# Patient Record
Sex: Female | Born: 1955 | Race: White | Hispanic: No | Marital: Married | Smoking: Former smoker
Health system: Southern US, Community
[De-identification: ages and names within clinical notes are randomized; demographics above are authoritative.]

## PROBLEM LIST (undated history)

## (undated) DIAGNOSIS — E119 Type 2 diabetes mellitus without complications: Secondary | ICD-10-CM

## (undated) DIAGNOSIS — J189 Pneumonia, unspecified organism: Secondary | ICD-10-CM

## (undated) DIAGNOSIS — K219 Gastro-esophageal reflux disease without esophagitis: Secondary | ICD-10-CM

## (undated) DIAGNOSIS — I10 Essential (primary) hypertension: Secondary | ICD-10-CM

## (undated) DIAGNOSIS — J449 Chronic obstructive pulmonary disease, unspecified: Secondary | ICD-10-CM

## (undated) DIAGNOSIS — F419 Anxiety disorder, unspecified: Secondary | ICD-10-CM

## (undated) DIAGNOSIS — M329 Systemic lupus erythematosus, unspecified: Secondary | ICD-10-CM

## (undated) DIAGNOSIS — M1990 Unspecified osteoarthritis, unspecified site: Secondary | ICD-10-CM

## (undated) DIAGNOSIS — K659 Peritonitis, unspecified: Secondary | ICD-10-CM

## (undated) DIAGNOSIS — J45909 Unspecified asthma, uncomplicated: Secondary | ICD-10-CM

## (undated) DIAGNOSIS — M797 Fibromyalgia: Secondary | ICD-10-CM

## (undated) HISTORY — PX: TONSILLECTOMY: SUR1361

## (undated) HISTORY — PX: SMALL INTESTINE SURGERY: SHX150

## (undated) HISTORY — PX: SPLENECTOMY, TOTAL: SHX788

## (undated) HISTORY — PX: JOINT REPLACEMENT: SHX530

## (undated) HISTORY — PX: CHOLECYSTECTOMY: SHX55

## (undated) HISTORY — PX: TOTAL ABDOMINAL HYSTERECTOMY W/ BILATERAL SALPINGOOPHORECTOMY: SHX83

## (undated) HISTORY — PX: ABDOMINAL SURGERY: SHX537

## (undated) HISTORY — PX: ABDOMINAL HYSTERECTOMY: SHX81

## (undated) HISTORY — PX: COLON SURGERY: SHX602

## (undated) HISTORY — DX: Peritonitis, unspecified: K65.9

## (undated) HISTORY — PX: APPENDECTOMY: SHX54

---

## 2017-04-04 ENCOUNTER — Encounter: Payer: Self-pay | Admitting: Emergency Medicine

## 2017-04-04 ENCOUNTER — Emergency Department: Payer: BLUE CROSS/BLUE SHIELD

## 2017-04-04 ENCOUNTER — Emergency Department
Admission: EM | Admit: 2017-04-04 | Discharge: 2017-04-04 | Disposition: A | Discharge status: Home / Self Care | Payer: BLUE CROSS/BLUE SHIELD | Attending: Emergency Medicine | Admitting: Emergency Medicine

## 2017-04-04 DIAGNOSIS — I10 Essential (primary) hypertension: Secondary | ICD-10-CM | POA: Diagnosis not present

## 2017-04-04 DIAGNOSIS — R51 Headache: Secondary | ICD-10-CM | POA: Insufficient documentation

## 2017-04-04 DIAGNOSIS — R110 Nausea: Secondary | ICD-10-CM | POA: Insufficient documentation

## 2017-04-04 DIAGNOSIS — R0789 Other chest pain: Secondary | ICD-10-CM | POA: Insufficient documentation

## 2017-04-04 DIAGNOSIS — E119 Type 2 diabetes mellitus without complications: Secondary | ICD-10-CM | POA: Diagnosis not present

## 2017-04-04 DIAGNOSIS — J45909 Unspecified asthma, uncomplicated: Secondary | ICD-10-CM | POA: Insufficient documentation

## 2017-04-04 HISTORY — DX: Type 2 diabetes mellitus without complications: E11.9

## 2017-04-04 HISTORY — DX: Unspecified asthma, uncomplicated: J45.909

## 2017-04-04 HISTORY — DX: Fibromyalgia: M79.7

## 2017-04-04 HISTORY — DX: Essential (primary) hypertension: I10

## 2017-04-04 HISTORY — DX: Systemic lupus erythematosus, unspecified: M32.9

## 2017-04-04 LAB — CBC
HCT: 42 % (ref 35.0–47.0)
Hemoglobin: 14.6 g/dL (ref 12.0–16.0)
MCH: 30.9 pg (ref 26.0–34.0)
MCHC: 34.8 g/dL (ref 32.0–36.0)
MCV: 88.8 fL (ref 80.0–100.0)
Platelets: 271 10*3/uL (ref 150–440)
RBC: 4.74 MIL/uL (ref 3.80–5.20)
RDW: 13.4 % (ref 11.5–14.5)
WBC: 6 10*3/uL (ref 3.6–11.0)

## 2017-04-04 LAB — BASIC METABOLIC PANEL
Anion gap: 8 (ref 5–15)
BUN: 10 mg/dL (ref 6–20)
CALCIUM: 9 mg/dL (ref 8.9–10.3)
CO2: 25 mmol/L (ref 22–32)
Chloride: 103 mmol/L (ref 101–111)
Creatinine, Ser: 1.08 mg/dL — ABNORMAL HIGH (ref 0.44–1.00)
GFR calc Af Amer: >60 mL/min (ref >60)
GFR, EST NON AFRICAN AMERICAN: 55 mL/min — AB (ref >60)
GLUCOSE: 149 mg/dL — AB (ref 65–99)
Potassium: 3.9 mmol/L (ref 3.5–5.1)
Sodium: 136 mmol/L (ref 135–145)

## 2017-04-04 LAB — TROPONIN I

## 2017-04-04 MED ORDER — ACETAMINOPHEN 325 MG PO TABS
ORAL_TABLET | ORAL | Status: AC
  Filled 2017-04-04: qty 2

## 2017-04-04 MED ORDER — ACETAMINOPHEN 325 MG PO TABS
650.0000 mg | ORAL_TABLET | Freq: Once | ORAL | Status: AC
  Administered 2017-04-04: 650 mg via ORAL

## 2017-04-04 NOTE — ED Notes (Signed)
Patient was triaged by this RN, put in computer by error under [facility] ED tech.

## 2017-04-04 NOTE — ED Provider Notes (Signed)
[facility] Emergency Department Provider Note  ____________________________________________   I have reviewed the triage vital signs and the nursing notes.   HISTORY  Chief Complaint Chest Pain    HPI Meredith Butler is a 61 y.o. female who has a history of knee replacements, fibromyalgia, remote history of abdominal surgery, including appendix surgery, cholecystectomy, and hysterectomy. Patient states her last 2 days she's had a pain in the left chest wall. It's worse when she moves or touches it. She states she had a bad cough 2 days ago. Cough is better but she still has residual pain. It is sharp and nonradiating. She has had mild nausea sometimes. She did have nitroglycerin. It did not seem to make much of a difference. Patient states her pain is currently 2 out of 10. She denies any exertional symptoms, she denies history of herself or her family PE or DVT she denies recent surgery, she denies leg swelling, she denies recent travel, she denies exhaustion estrogens or pregnancy, patient states that she can identify the exact spot in the costochondral margin where she has the pain. She states the pain has been uninterrupted the there for the last 3 days since she coughed. She states that she has had no exertional symptoms. She states that she has no pleuritic chest pain or shortness of breath. The pain did not radiate to the back it was not stabbing or tearing in onset. She does have a history of hypertension she is not quite with her medications usual blood pressures over 2 and 140s 150s.   Past Medical History:  Diagnosis Date  . Asthma   . Diabetes mellitus without complication ([place])   . Fibromyalgia   . Hypertension   . Lupus     There are no active problems to display for this patient.   Past Surgical History:  Procedure Laterality Date  . ABDOMINAL HYSTERECTOMY    . ABDOMINAL SURGERY    . APPENDECTOMY    . CHOLECYSTECTOMY    . COLON SURGERY    .  TONSILLECTOMY      Prior to Admission medications   Not on File    Allergies Claforan [cefotaxime]; Ibuprofen; Omnicef [cefdinir]; Penicillins; Sulfa antibiotics; Tetanus toxoids; Voltaren [diclofenac sodium]; Codeine; Diovan [valsartan]; and Rocephin [ceftriaxone]  No family history on file.  Social History Social History  Substance Use Topics  . Smoking status: Not on file  . Smokeless tobacco: Not on file  . Alcohol use Not on file    Review of Systems Constitutional: No fever/chills Eyes: No visual changes. ENT: No sore throat. No stiff neck no neck pain Cardiovascular: Positive chest pain. Respiratory: Denies shortness of breath. Gastrointestinal:   no vomiting.  No diarrhea.  No constipation. Genitourinary: Negative for dysuria. Musculoskeletal: Negative lower extremity swelling Skin: Negative for rash. Neurological: Negative for severe headaches, focal weakness or numbness. 10-point ROS otherwise negative.  ____________________________________________   PHYSICAL EXAM:  VITAL SIGNS: ED Triage Vitals  Enc Vitals Group     BP 04/04/17 1513 (!) 160/109     Pulse Rate 04/04/17 1513 86     Resp 04/04/17 1513 17     Temp --      Temp src --      SpO2 04/04/17 1513 95 %     Weight 04/04/17 1611 236 lb 15.9 oz (107.5 kg)     Height 04/04/17 1558 5\' 7"  (1.702 m)     Head Circumference --      Peak Flow --  Pain Score 04/04/17 1522 3     Pain Loc --      Pain Edu? --      Excl. in [place]? --     Constitutional: Alert and oriented. Well appearing and in no acute distress. Eyes: Conjunctivae are normal. PERRL. EOMI. Head: Atraumatic. Nose: No congestion/rhinnorhea. Mouth/Throat: Mucous membranes are moist.  Oropharynx non-erythematous. Neck: No stridor.   Nontender with no meningismus Cardiovascular: Normal rate, regular rhythm. Grossly normal heart sounds.  Good peripheral circulation. Chest: Female chaperone present, patient spells but also present at her  request. Patient has refusal chest wall pain in the costochondral margin on the left. When I touch this area patient says "ouch that's the pain right there". No fracture palpated, no crepitus no flail chest. Respiratory: Normal respiratory effort.  No retractions. Lungs CTAB. Abdominal: Soft and nontender. No distention. No guarding no rebound Back:  There is no focal tenderness or step off.  there is no midline tenderness there are no lesions noted. there is no CVA tenderness Musculoskeletal: No lower extremity tenderness, no upper extremity tenderness. No joint effusions, no DVT signs strong distal pulses no edema Neurologic:  Normal speech and language. No gross focal neurologic deficits are appreciated.  Skin:  Skin is warm, dry and intact. No rash noted. Psychiatric: Mood and affect are normal. Speech and behavior are normal.  ____________________________________________   LABS (all labs ordered are listed, but only abnormal results are displayed)  Labs Reviewed  BASIC METABOLIC PANEL - Abnormal; Notable for the following:       Result Value   Glucose, Bld 149 (*)    Creatinine, Ser 1.08 (*)    GFR calc non Af Amer 55 (*)    All other components within normal limits  CBC  TROPONIN I  TROPONIN I   ____________________________________________  EKG  I personally interpreted any EKGs ordered by me or triage EKG shows normal sinus rhythm 80 bpm no acute ST admission acute ST depression normal axis unremarkable EKG ____________________________________________  RADIOLOGY  I reviewed any imaging ordered by me or triage that were performed during my shift and, if possible, patient and/or family made aware of any abnormal findings. ____________________________________________   PROCEDURES  Procedure(s) performed: None  Procedures  Critical Care performed: None  ____________________________________________   INITIAL IMPRESSION / ASSESSMENT AND PLAN / ED COURSE  Pertinent  labs & imaging results that were available during my care of the patient were reviewed by me and considered in my medical decision making (see chart for details).  Patient here with very reproducible chest wall pain after coughing for 3 days. There is low suspicion for ACS, initial and second troponin are negative. Low heart score, patient does have reproducible chest wall pain.At this time, there does not appear to be clinical evidence to support the diagnosis of pulmonary embolus, dissection, myocarditis, endocarditis, pericarditis, pericardial tamponade, acute coronary syndrome, pneumothorax, pneumonia, or any other acute intrathoracic pathology that will require admission or acute intervention. Nor is there evidence of any significant intra-abdominal pathology causing this discomfort. Blood pressure was initially elevated but his come as she has relaxed. She is noncompliant with blood pressure medication. We will send her home on her home blood pressure medication. There is no evidence at this time of acute coronary syndrome, I don't think she has a dissection, I think she will do better at home. Patient very comfortable with this plan. Her pain is minimal and only there when I touch it or when she moves.  Since of return precautions and follow-up given and understood.    ____________________________________________   FINAL CLINICAL IMPRESSION(S) / ED DIAGNOSES  Final diagnoses:  None      This chart was dictated using voice recognition software.  Despite best efforts to proofread,  errors can occur which can change meaning.      Schuyler Amor, MD 04/04/17 [phone]

## 2017-04-04 NOTE — ED Triage Notes (Signed)
Patient comes in from home via [facility] with chest pain that has been off and on for 2 days but more consistent today. Patient reports she was grocery shopping and the pain started. Reports it is central/left chest. Ems gave patient 324mg  aspirin, 2 nitro sprays. Patient currently is complaining of headache and nausea.

## 2017-12-24 ENCOUNTER — Ambulatory Visit (INDEPENDENT_AMBULATORY_CARE_PROVIDER_SITE_OTHER): Payer: BLUE CROSS/BLUE SHIELD | Admitting: Unknown Physician Specialty

## 2017-12-24 ENCOUNTER — Encounter: Payer: Self-pay | Admitting: Unknown Physician Specialty

## 2017-12-24 ENCOUNTER — Other Ambulatory Visit: Payer: Self-pay | Admitting: Unknown Physician Specialty

## 2017-12-24 DIAGNOSIS — J4540 Moderate persistent asthma, uncomplicated: Secondary | ICD-10-CM | POA: Diagnosis not present

## 2017-12-24 DIAGNOSIS — E7800 Pure hypercholesterolemia, unspecified: Secondary | ICD-10-CM | POA: Diagnosis not present

## 2017-12-24 DIAGNOSIS — R7989 Other specified abnormal findings of blood chemistry: Secondary | ICD-10-CM | POA: Diagnosis not present

## 2017-12-24 DIAGNOSIS — I10 Essential (primary) hypertension: Secondary | ICD-10-CM | POA: Diagnosis not present

## 2017-12-24 DIAGNOSIS — E1169 Type 2 diabetes mellitus with other specified complication: Secondary | ICD-10-CM | POA: Insufficient documentation

## 2017-12-24 DIAGNOSIS — J45909 Unspecified asthma, uncomplicated: Secondary | ICD-10-CM | POA: Insufficient documentation

## 2017-12-24 DIAGNOSIS — E119 Type 2 diabetes mellitus without complications: Secondary | ICD-10-CM | POA: Diagnosis not present

## 2017-12-24 DIAGNOSIS — M2550 Pain in unspecified joint: Secondary | ICD-10-CM

## 2017-12-24 DIAGNOSIS — M797 Fibromyalgia: Secondary | ICD-10-CM | POA: Diagnosis not present

## 2017-12-24 HISTORY — DX: Other specified abnormal findings of blood chemistry: R79.89

## 2017-12-24 MED ORDER — CYCLOBENZAPRINE HCL 10 MG PO TABS: 10.0000 mg | ORAL_TABLET | Freq: Three times a day (TID) | ORAL | 0 refills | Status: DC | PRN

## 2017-12-24 NOTE — Assessment & Plan Note (Signed)
Add Cyclobenzeprine QHS

## 2017-12-24 NOTE — Assessment & Plan Note (Signed)
Laino treatment.  Just on for 2 weeks.  Will check next visit

## 2017-12-24 NOTE — Assessment & Plan Note (Signed)
Will do arthritis labs.  Under care of rheumatologist in past.  Will refer to rheumatology

## 2017-12-24 NOTE — Assessment & Plan Note (Signed)
Appropriate treatment on metformin.  Will refer to lifestyle center

## 2017-12-24 NOTE — Assessment & Plan Note (Signed)
Not to goal.  Taking Albuterol daily.  Advair bothered her.  Does not want another inhaler

## 2017-12-24 NOTE — Progress Notes (Signed)
BP 138/83   Pulse 82   Temp 98.3 F (36.8 C) (Oral)   Ht 5' 8.5" (1.74 m)   Wt 223 lb 9.6 oz (101.4 kg)   SpO2 96%   BMI 33.50 kg/m    Subjective:    Patient ID: Meredith Butler, female    DOB: [birth year], 62 y.o.   MRN: [id number]  HPI: Meredith Butler is a 62 y.o. female  Chief Complaint  Patient presents with  . Establish Care    pt states she wants to discuss lab work and possible diabetes  . Muscle Pain    pt states she has been getting stiff and feels like muscles get knots on them   . Cyst    pt states she has a place on her back she would like looked at, states it has been there for years    Meredith Butler is [place] to the area and getting medical care in [place].  She has a history of fibromyalgia..    Muscle pain Pt states her muscles "knot up" and they are "ripping off her bone."  Her hands are swollen.  This is most notably a problem with her PIP and thumb MTP.  Pt is taking Duloxetine for fibromyalgia symptoms.  States pain is getting worse.  She does have bilateral knee replacements.  She has seen a rheumatologist and had "her about diagnosed with lupus."  Diabetes Hgb A1C is 6.7 according to patient.  She has been on Metformin for only 2 weeks.  No frequent thirst or urination.    High TSH TSH done on 07/10/17 was 4.83.  Complains of of fatigue.  Takes 2 days to get over working  Hypertension  Using medications without difficulty Average home BPs Does not check   Using medication without problems or lightheadedness No chest pain with exertion or shortness of breath No Edema  Elevated Cholesterol Using medications without problems No change in muscle pain on or off Atorvastatin Diet: Exercise: has an active job.  Starting to cut out sugar.   Asthma Daily inhaler use with working outside   Social History   Socioeconomic History  . Marital status: Married    Spouse name: Not on file  . Number of children: Not on file  . Years of education: Not on file  . Highest  education level: Not on file  Social Needs  . Financial resource strain: Not on file  . Food insecurity - worry: Not on file  . Food insecurity - inability: Not on file  . Transportation needs - medical: Not on file  . Transportation needs - non-medical: Not on file  Occupational History  . Not on file  Tobacco Use  . Smoking status: Former Smoker    Last attempt to quit: 11/20/2009    Years since quitting: 8.0  . Smokeless tobacco: Never Used  Substance and Sexual Activity  . Alcohol use: No    Frequency: Never  . Drug use: No  . Sexual activity: Yes  Other Topics Concern  . Not on file  Social History Narrative  . Not on file   Family History  Problem Relation Age of Onset  . Heart attack Mother   . Cancer Mother   . Heart attack Father   . Diabetes Brother   . Diabetes Daughter   . Cancer Maternal Grandmother        ovarian  . Cancer Paternal Grandfather        colon  . Cancer Sister  lung  . Diabetes Daughter     Past Surgical History:  Procedure Laterality Date  . ABDOMINAL HYSTERECTOMY     complete  . ABDOMINAL SURGERY    . APPENDECTOMY    . CHOLECYSTECTOMY    . COLON SURGERY    . JOINT REPLACEMENT Bilateral    knee  . SMALL INTESTINE SURGERY    . SPLENECTOMY, TOTAL    . TONSILLECTOMY      Past Medical History:  Diagnosis Date  . Asthma   . Diabetes mellitus without complication ([place])   . Fibromyalgia   . Hypertension   . Lupus   . Peritonitis ([place])     Relevant past medical, surgical, family and social history reviewed and updated as indicated. Interim medical history since our last visit reviewed. Allergies and medications reviewed and updated.  Review of Systems  Per HPI unless specifically indicated above     Objective:    BP 138/83   Pulse 82   Temp 98.3 F (36.8 C) (Oral)   Ht 5' 8.5" (1.74 m)   Wt 223 lb 9.6 oz (101.4 kg)   SpO2 96%   BMI 33.50 kg/m   Wt Readings from Last 3 Encounters:  12/24/17 223 lb 9.6 oz  (101.4 kg)  04/04/17 236 lb 15.9 oz (107.5 kg)    Physical Exam  Constitutional: She is oriented to person, place, and time. She appears well-developed and well-nourished. No distress.  HENT:  Head: Normocephalic and atraumatic.  Eyes: Conjunctivae and lids are normal. Right eye exhibits no discharge. Left eye exhibits no discharge. No scleral icterus.  Neck: Normal range of motion. Neck supple. No JVD present. Carotid bruit is not present.  Cardiovascular: Normal rate, regular rhythm and normal heart sounds.  Pulmonary/Chest: Effort normal and breath sounds normal.  Abdominal: Normal appearance. There is no splenomegaly or hepatomegaly.  Musculoskeletal: Normal range of motion.  Neurological: She is alert and oriented to person, place, and time.  Skin: Skin is warm, dry and intact. No rash noted. No pallor.  Psychiatric: She has a normal mood and affect. Her behavior is normal. Judgment and thought content normal.    Results for orders placed or performed during the hospital encounter of 63/81/77  Basic metabolic panel  Result Value Ref Range   Sodium 136 135 - 145 mmol/L   Potassium 3.9 3.5 - 5.1 mmol/L   Chloride 103 101 - 111 mmol/L   CO2 25 22 - 32 mmol/L   Glucose, Bld 149 (H) 65 - 99 mg/dL   BUN 10 6 - 20 mg/dL   Creatinine, Ser 1.08 (H) 0.44 - 1.00 mg/dL   Calcium 9.0 8.9 - 10.3 mg/dL   GFR calc non Af Amer 55 (L) >60 mL/min   GFR calc Af Amer >60 >60 mL/min   Anion gap 8 5 - 15  CBC  Result Value Ref Range   WBC 6.0 3.6 - 11.0 K/uL   RBC 4.74 3.80 - 5.20 MIL/uL   Hemoglobin 14.6 12.0 - 16.0 g/dL   HCT 42.0 35.0 - 47.0 %   MCV 88.8 80.0 - 100.0 fL   MCH 30.9 26.0 - 34.0 pg   MCHC 34.8 32.0 - 36.0 g/dL   RDW 13.4 11.5 - 14.5 %   Platelets 271 150 - 440 K/uL  Troponin I  Result Value Ref Range   Troponin I <0.03 <0.03 ng/mL  Troponin I  Result Value Ref Range   Troponin I <0.03 <0.03 ng/mL  Assessment & Plan:   Problem List Items Addressed This Visit       Unprioritized   Asthma    Not to goal.  Taking Albuterol daily.  Advair bothered her.  Does not want another inhaler      Relevant Medications   PROAIR HFA 108 (90 Base) MCG/ACT inhaler   Elevated TSH   Relevant Orders   Thyroid Panel With TSH   Essential hypertension, benign   Relevant Medications   atorvastatin (LIPITOR) 20 MG tablet   lisinopril-hydrochlorothiazide (PRINZIDE,ZESTORETIC) 20-12.5 MG tablet   Other Relevant Orders   Comprehensive metabolic panel   Fibromyalgia    Add Cyclobenzeprine QHS      Relevant Orders   Vitamin B12   VITAMIN D 25 Hydroxy (Vit-D Deficiency, Fractures)   Hypercholesteremia    M[place] treatment.  Just on for 2 weeks.  Will check next visit      Relevant Medications   atorvastatin (LIPITOR) 20 MG tablet   lisinopril-hydrochlorothiazide (PRINZIDE,ZESTORETIC) 20-12.5 MG tablet   Polyarthralgia    Will do arthritis labs.  Under care of rheumatologist in past.  Will refer to rheumatology      Relevant Orders   Ambulatory referral to Rheumatology   Sed Rate (ESR)   Rheumatoid Arthritis Profile   ANA w/Reflex   CBC with Differential/Platelet   Type 2 diabetes mellitus without complication, without long-term current use of insulin ([place])    Appropriate treatment on metformin.  Will refer to lifestyle center      Relevant Medications   atorvastatin (LIPITOR) 20 MG tablet   metFORMIN (GLUCOPHAGE) 500 MG tablet   lisinopril-hydrochlorothiazide (PRINZIDE,ZESTORETIC) 20-12.5 MG tablet   Other Relevant Orders   Amb Referral to Nutrition and Diabetic E       Follow up plan: Return in about 4 weeks (around 01/21/2018) for Fibromyalgia and Spirometry.

## 2017-12-25 ENCOUNTER — Encounter: Payer: Self-pay | Admitting: Unknown Physician Specialty

## 2017-12-26 LAB — CBC WITH DIFFERENTIAL/PLATELET
BASOS: 1 %
Basophils Absolute: 0.1 10*3/uL (ref 0.0–0.2)
EOS (ABSOLUTE): 0.3 10*3/uL (ref 0.0–0.4)
EOS: 5 %
HEMATOCRIT: 39.4 % (ref 34.0–46.6)
HEMOGLOBIN: 13.6 g/dL (ref 11.1–15.9)
IMMATURE GRANULOCYTES: 0 %
Immature Grans (Abs): 0 10*3/uL (ref 0.0–0.1)
Lymphocytes Absolute: 1.3 10*3/uL (ref 0.7–3.1)
Lymphs: 21 %
MCH: 30.7 pg (ref 26.6–33.0)
MCHC: 34.5 g/dL (ref 31.5–35.7)
MCV: 89 fL (ref 79–97)
MONOS ABS: 0.5 10*3/uL (ref 0.1–0.9)
Monocytes: 8 %
NEUTROS PCT: 65 %
Neutrophils Absolute: 4 10*3/uL (ref 1.4–7.0)
Platelets: 297 10*3/uL (ref 150–379)
RBC: 4.43 x10E6/uL (ref 3.77–5.28)
RDW: 13.4 % (ref 12.3–15.4)
WBC: 6.1 10*3/uL (ref 3.4–10.8)

## 2017-12-26 LAB — VITAMIN D 25 HYDROXY (VIT D DEFICIENCY, FRACTURES): VIT D 25 HYDROXY: 31 ng/mL (ref 30.0–100.0)

## 2017-12-26 LAB — ENA+DNA/DS+SJORGEN'S
ENA RNP Ab: 1.7 AI — ABNORMAL HIGH (ref 0.0–0.9)
ENA SM Ab Ser-aCnc: <0.2 AI (ref 0.0–0.9)
ENA SSB (LA) Ab: <0.2 AI (ref 0.0–0.9)

## 2017-12-26 LAB — COMPREHENSIVE METABOLIC PANEL
ALT: 18 IU/L (ref 0–32)
AST: 17 IU/L (ref 0–40)
Albumin/Globulin Ratio: 1.4 (ref 1.2–2.2)
Albumin: 4.3 g/dL (ref 3.6–4.8)
Alkaline Phosphatase: 77 IU/L (ref 39–117)
BUN/Creatinine Ratio: 16 (ref 12–28)
BUN: 14 mg/dL (ref 8–27)
Bilirubin Total: 0.6 mg/dL (ref 0.0–1.2)
CALCIUM: 9.3 mg/dL (ref 8.7–10.3)
CO2: 23 mmol/L (ref 20–29)
Chloride: 100 mmol/L (ref 96–106)
Creatinine, Ser: 0.88 mg/dL (ref 0.57–1.00)
GFR calc Af Amer: 82 mL/min/{1.73_m2} (ref >59)
GFR, EST NON AFRICAN AMERICAN: 71 mL/min/{1.73_m2} (ref >59)
GLOBULIN, TOTAL: 3 g/dL (ref 1.5–4.5)
GLUCOSE: 135 mg/dL — AB (ref 65–99)
Potassium: 4.1 mmol/L (ref 3.5–5.2)
Sodium: 140 mmol/L (ref 134–144)
Total Protein: 7.3 g/dL (ref 6.0–8.5)

## 2017-12-26 LAB — SEDIMENTATION RATE: SED RATE: 18 mm/h (ref 0–40)

## 2017-12-26 LAB — RHEUMATOID ARTHRITIS PROFILE: Cyclic Citrullin Peptide Ab: 6 units (ref 0–19)

## 2017-12-26 LAB — VITAMIN B12: VITAMIN B 12: 572 pg/mL (ref 232–1245)

## 2017-12-26 LAB — THYROID PANEL WITH TSH
FREE THYROXINE INDEX: 2 (ref 1.2–4.9)
T3 UPTAKE RATIO: 28 % (ref 24–39)
T4, Total: 7 ug/dL (ref 4.5–12.0)
TSH: 2.07 u[IU]/mL (ref 0.450–4.500)

## 2017-12-26 LAB — ANA W/REFLEX: Anti Nuclear Antibody(ANA): POSITIVE — AB

## 2018-01-14 ENCOUNTER — Encounter: Payer: Self-pay | Admitting: *Deleted

## 2018-01-14 ENCOUNTER — Encounter: Payer: BLUE CROSS/BLUE SHIELD | Attending: Unknown Physician Specialty | Admitting: *Deleted

## 2018-01-14 VITALS — BP 120/82 | Ht 69.0 in | Wt 220.7 lb

## 2018-01-14 DIAGNOSIS — Z6832 Body mass index (BMI) 32.0-32.9, adult: Secondary | ICD-10-CM | POA: Insufficient documentation

## 2018-01-14 DIAGNOSIS — Z713 Dietary counseling and surveillance: Secondary | ICD-10-CM | POA: Diagnosis present

## 2018-01-14 DIAGNOSIS — E119 Type 2 diabetes mellitus without complications: Secondary | ICD-10-CM | POA: Diagnosis not present

## 2018-01-14 NOTE — Patient Instructions (Addendum)
Check blood sugars 2 x day before breakfast and 2 hrs after one meal 3-4 x week Bring blood sugar records to the next appointment  Call your doctor for a prescription for:  1. Meter strips (type) One Touch Verio checking  3-4 times per week  2. Lancets (type) One Touch Delica checking  3-4   times per week  Exercise: Begin walking for  10-15  minutes   3 days a week and increase as tolerated  Eat 3 meals day, 1-2  snacks a day Space meals 4-6 hours apart Avoid sugar sweetened drinks (tea) Complete 3 Day Food Record and bring to next appt  Return for appointment on:  Tuesday January 28, 2018 at 1:30 pm with Meredith Butler (dietitian)

## 2018-01-15 NOTE — Progress Notes (Signed)
Diabetes Self-Management Education  Visit Type: First/Initial  Appt. Start Time: 1520 Appt. End Time: 3419  01/14/2018  Ms. Meredith Butler, identified by name and date of birth, is a 62 y.o. female with a diagnosis of Diabetes: Type 2.   ASSESSMENT  Blood pressure 120/82, height 5\' 9"  (1.753 m), weight 220 lb 11.2 oz (100.1 kg). Body mass index is 32.59 kg/m.  Diabetes Self-Management Education - 01/14/18 1654      Visit Information   Visit Type  First/Initial      Initial Visit   Diabetes Type  Type 2    Are you currently following a meal plan?  No    Are you taking your medications as prescribed?  Yes    Date Diagnosed  last month      Health Coping   How would you rate your overall health?  Fair      Psychosocial Assessment   Patient Belief/Attitude about Diabetes  Other (comment) "depressed"    Self-care barriers  None    Self-management support  Doctor's office;Family    Patient Concerns  Nutrition/Meal planning;Medication;Glycemic Control;Weight Control    Special Needs  None    Preferred Learning Style  Visual;Auditory    Learning Readiness  Ready    How often do you need to have someone help you when you read instructions, pamphlets, or other written materials from your doctor or pharmacy?  1 - Never    What is the last grade level you completed in school?  GED      Pre-Education Assessment   Patient understands the diabetes disease and treatment process.  Needs Instruction    Patient understands incorporating nutritional management into lifestyle.  Needs Instruction    Patient undertands incorporating physical activity into lifestyle.  Needs Instruction    Patient understands using medications safely.  Needs Instruction    Patient understands monitoring blood glucose, interpreting and using results  Needs Instruction    Patient understands prevention, detection, and treatment of acute complications.  Needs Instruction    Patient understands prevention, detection,  and treatment of chronic complications.  Needs Instruction    Patient understands how to develop strategies to address psychosocial issues.  Needs Instruction    Patient understands how to develop strategies to promote health/change behavior.  Needs Instruction      Complications   How often do you check your blood sugar?  0 times/day (not testing) Provided One Touch Verio Flex and instructed on use. BG upon return demonstration was 81 mg/dL at 4:30 pm - 6 hrs pp.     Have you had a dilated eye exam in the past 12 months?  Yes    Have you had a dental exam in the past 12 months?  No    Are you checking your feet?  Yes    How many days per week are you checking your feet?  2      Dietary Intake   Breakfast  cereal and milk; bacon, eggs, pancakes, frits    Snack (morning)  fruit    Lunch  salad or tuna or left overs from supper    Dinner  chicken, pork, beef, potatoes, bread, peas, beans, corn, rice, pasta, tomato, carrots, cuccumbers, broccoli, cauliflower    Beverage(s)  sugar sweetened tea, water, black coffee      Exercise   Exercise Type  ADL's      Patient Education   Previous Diabetes Education  Yes (please comment) went with her mother -  who also has diabetes    Disease state   Definition of diabetes, type 1 and 2, and the diagnosis of diabetes;Factors that contribute to the development of diabetes    Nutrition management   Role of diet in the treatment of diabetes and the relationship between the three main macronutrients and blood glucose level;Reviewed blood glucose goals for pre and post meals and how to evaluate the patients' food intake on their blood glucose level.    Physical activity and exercise   Role of exercise on diabetes management, blood pressure control and cardiac health.    Medications  Reviewed patients medication for diabetes, action, purpose, timing of dose and side effects.    Monitoring  Taught/evaluated SMBG meter.;Purpose and frequency of  SMBG.;Taught/discussed recording of test results and interpretation of SMBG.;Identified appropriate SMBG and/or A1C goals.    Chronic complications  Relationship between chronic complications and blood glucose control    Psychosocial adjustment  Identified and addressed patients feelings and concerns about diabetes      Individualized Goals (developed by patient)   Reducing Risk  Improve blood sugars Decrease medications Lose weight Become more fit     Outcomes   Expected Outcomes  Demonstrated interest in learning. Expect positive outcomes    Future DMSE  2 wks       Individualized Plan for Diabetes Self-Management Training:   Learning Objective:  Patient will have a greater understanding of diabetes self-management. Patient education plan is to attend individual and/or group sessions per assessed needs and concerns.   Plan:   Patient Instructions  Check blood sugars 2 x day before breakfast and 2 hrs after one meal 3-4 x week Bring blood sugar records to the next appointment Call your doctor for a prescription for:  1. Meter strips (type) One Touch Verio checking  3-4 times per week  2. Lancets (type) One Touch Delica checking  3-4   times per week Exercise: Begin walking for  10-15  minutes   3 days a week and increase as tolerated Eat 3 meals day, 1-2  snacks a day Space meals 4-6 hours apart Avoid sugar sweetened drinks (tea) Complete 3 Day Food Record and bring to next appt Return for appointment on:  Tuesday January 28, 2018 at 1:30 pm with Jaclyn Shaggy (dietitian)   Expected Outcomes:  Demonstrated interest in learning. Expect positive outcomes  Education material provided:  General Meal Planning Guidelines Simple Meal Plan Meter = One Touch Verio Flex 3 Day Food Record  If problems or questions, patient to contact team via:  Meredith Drilling, RN, [place], CDE [phone]  Future DSME appointment: 2 wks  January 28, 2018 with the dietitian

## 2018-01-16 ENCOUNTER — Telehealth: Payer: Self-pay | Admitting: Unknown Physician Specialty

## 2018-01-16 NOTE — Telephone Encounter (Signed)
Pt meant to say One Touch Viro Flex test strips NOT the other indicated in the note below

## 2018-01-16 NOTE — Telephone Encounter (Signed)
Copied from [place]. Topic: Quick Communication - See Telephone Encounter >> Jan 16, 2018  2:25 PM Ivar Drape wrote: CRM for notification. See Telephone encounter for:  01/16/18. Patient stated that the doctor was suppose to send in a prescription for Accu One Test strips for her and send it to her preferred pharmacy [facility] [place], [place].

## 2018-01-17 MED ORDER — GLUCOSE BLOOD VI STRP: ORAL_STRIP | 12 refills | Status: DC

## 2018-01-21 ENCOUNTER — Ambulatory Visit: Payer: BLUE CROSS/BLUE SHIELD | Admitting: Unknown Physician Specialty

## 2018-01-27 ENCOUNTER — Telehealth: Payer: Self-pay

## 2018-01-27 NOTE — Telephone Encounter (Signed)
The referral is being sent today. I just got the information to where patient wanted to go last week.  Meredith Butler is probably booking out till April but may can see one of our other providers.  Routing to CMA as FYI. Will contact patient.

## 2018-01-27 NOTE — Telephone Encounter (Signed)
Sorry, meaning to route to CMA as [place]

## 2018-01-27 NOTE — Telephone Encounter (Signed)
Was this meant for [facility]?

## 2018-01-27 NOTE — Telephone Encounter (Signed)
Copied from [place]. Topic: General - Other >> Jan 27, 2018 11:04 AM Ivar Drape wrote: Reason for CRM:   Patient would like the Rheumatology referral refaxed for [facility] because they said they didn't receive it.  Also the patient had an appt on Weds 01/29/18 but it was cancelled because the pcp will not be in the office.  There is not another appt until 02/17/18.  The patient expressed she cannot wait 3 weeks for that appt.  Please advise.

## 2018-01-28 ENCOUNTER — Encounter: Payer: BLUE CROSS/BLUE SHIELD | Attending: Unknown Physician Specialty | Admitting: Dietician

## 2018-01-28 ENCOUNTER — Encounter: Payer: Self-pay | Admitting: Dietician

## 2018-01-28 ENCOUNTER — Ambulatory Visit: Payer: BLUE CROSS/BLUE SHIELD | Admitting: Dietician

## 2018-01-28 VITALS — BP 124/86 | Wt 216.4 lb

## 2018-01-28 DIAGNOSIS — Z6832 Body mass index (BMI) 32.0-32.9, adult: Secondary | ICD-10-CM | POA: Diagnosis not present

## 2018-01-28 DIAGNOSIS — E119 Type 2 diabetes mellitus without complications: Secondary | ICD-10-CM | POA: Diagnosis not present

## 2018-01-28 DIAGNOSIS — Z713 Dietary counseling and surveillance: Secondary | ICD-10-CM | POA: Diagnosis not present

## 2018-01-28 NOTE — Telephone Encounter (Signed)
Tried calling patient, LVM to return call. Ok for [facility] to give information below if patient returns call.

## 2018-01-28 NOTE — Telephone Encounter (Signed)
Pt aware of below message. Calling to check status on the referral being sent.

## 2018-01-28 NOTE — Progress Notes (Signed)
Diabetes Self-Management Education  Visit Type:  Follow-up  Appt. Start Time: 1330    Appt. End Time: 1430 01/28/2018  Ms. Meredith Butler, identified by name and date of birth, is a 62 y.o. female with a diagnosis of Diabetes: Type 2 diabetes ASSESSMENT  Blood pressure 124/86, weight 216 lb 6.4 oz (98.2 kg). Body mass index is 31.96 kg/m.   Diabetes Self-Management Education - 23/55/73 2202      Complications   How often do you check your blood sugar?  3-4 times / week    Fasting Blood glucose range (mg/dL)  70-129    Postprandial Blood glucose range (mg/dL)  130-179    Have you had a dilated eye exam in the past 12 months?  Yes    Have you had a dental exam in the past 12 months?  No    How many days per week are you checking your feet?  2      Dietary Intake   Breakfast  9:00am- Special K cereal with whole milk or eggs/grits/banana, 2 cups black coffee    Lunch  12:30pm- 4 squares pizza or 2 fish sandwiches, onion rings, water or sweet tea    Dinner  6:00pm- beef stroganoff with noodles, corn , 2 biscuits or chicken, broccoli/cauliflower, potatoes, sweet tea    Beverage(s)  coffee, water, is trying to limit sweet tea to 1 glass daily. Makes with 1/2 cup sugar to 1 gallon tea; much less than previously      Exercise   Exercise Type  Light (walking / raking leaves)    How many days per week to you exercise?  1    How many minutes per day do you exercise?  15    Total minutes per week of exercise  15      Patient Education   Nutrition management   Role of diet in the treatment of diabetes and the relationship between the three main macronutrients and blood glucose level;Food label reading, portion sizes and measuring food.;Carbohydrate counting;Information on hints to eating out and maintain blood glucose control.    Physical activity and exercise   Role of exercise on diabetes management, blood pressure control and cardiac health.    Medications  Reviewed patients medication for  diabetes, action, purpose, timing of dose and side effects.    Monitoring  Purpose and frequency of SMBG.    Chronic complications  Relationship between chronic complications and blood glucose control;Lipid levels, blood glucose control and heart disease      Post-Education Assessment   Patient understands the diabetes disease and treatment process.  Demonstrates understanding / competency    Patient understands incorporating nutritional management into lifestyle.  Demonstrates understanding / competency    Patient undertands incorporating physical activity into lifestyle.  Demonstrates understanding / competency    Patient understands using medications safely.  Demonstrates understanding / competency    Patient understands monitoring blood glucose, interpreting and using results  Demonstrates understanding / competency    Patient understands prevention, detection, and treatment of acute complications.  Demonstrates understanding / competency    Patient understands prevention, detection, and treatment of chronic complications.  Demonstrates understanding / competency    Patient understands how to develop strategies to promote health/change behavior.  Demonstrates understanding / competency      Outcomes   Program Status  Completed       Learning Objective:  Patient will have a greater understanding of diabetes self-management. Patient education plan is to attend individual and/or  group sessions per assessed needs and concerns. Instruction:  Instructed on a meal plan based on 1600 calories including carbohydrate counting, portion control, and how to better balance carbohydrate, protein and non-starchy vegetables. Use Food Guide Plate for diabetes and Planning a Balanced Meal as well as food models to demonstrate balance and portions. Also, instructed on label reading and how to look up nutrition information for meals eaten "out". Gave and reviewed menu examples discussing how to incorporate  her food preferences.   Patient Instructions  Balance meals with 2-4 oz protein, 2-3 servings of carbohydrate (starch, fruit, milk/yogurt, small serving of something sweet like 5-6 vanilla wafers) and non-starchy vegetables. Use calorieking.com to look up food labels especially of foods from [employer]. Measure out some portions especially starchy foods. Continue to include at least 32 oz water as a minimum.      Expected Outcomes:  Demonstrated interest in learning. Expect positive outcomes  Education material provided:  Planning a Balanced Meal Food Guide Plate Sample menus  If problems or questions, patient to contact team via:  Karolee Stamps, RD,CDE  [phone]  Future DSME appointment: - PRN

## 2018-01-28 NOTE — Patient Instructions (Signed)
Balance meals with 2-4 oz protein, 2-3 servings of carbohydrate (starch, fruit, milk/yogurt, small serving of something sweet like 5-6 vanilla wafers) and non-starchy vegetables. Use calorieking.com to look up food labels especially of foods from [employer]. Measure out some portions especially starchy foods. Continue to include at least 32 oz water as a minimum.

## 2018-01-29 ENCOUNTER — Ambulatory Visit: Payer: BLUE CROSS/BLUE SHIELD | Admitting: Unknown Physician Specialty

## 2018-01-29 NOTE — Telephone Encounter (Signed)
Called and left patient a VM letting her know that her referral was faxed to the provider she requested on 01/27/18. Asked for her to call back with any questions or concerns.

## 2018-01-30 ENCOUNTER — Encounter: Payer: Self-pay | Admitting: Family Medicine

## 2018-01-30 ENCOUNTER — Ambulatory Visit: Payer: BLUE CROSS/BLUE SHIELD | Admitting: Family Medicine

## 2018-01-30 VITALS — BP 128/88 | HR 70 | Temp 98.5°F | Wt 217.0 lb

## 2018-01-30 DIAGNOSIS — J4540 Moderate persistent asthma, uncomplicated: Secondary | ICD-10-CM

## 2018-01-30 DIAGNOSIS — M797 Fibromyalgia: Secondary | ICD-10-CM

## 2018-01-30 MED ORDER — ALBUTEROL SULFATE (2.5 MG/3ML) 0.083% IN NEBU
2.5000 mg | INHALATION_SOLUTION | Freq: Once | RESPIRATORY_TRACT | Status: AC
  Administered 2018-01-30: 2.5 mg via RESPIRATORY_TRACT

## 2018-01-30 MED ORDER — DULOXETINE HCL 60 MG PO CPEP: 60.0000 mg | ORAL_CAPSULE | Freq: Every day | ORAL | 1 refills | Status: DC

## 2018-01-30 MED ORDER — UMECLIDINIUM-VILANTEROL 62.5-25 MCG/INH IN AEPB: 1.0000 | INHALATION_SPRAY | Freq: Every day | RESPIRATORY_TRACT | 6 refills | Status: DC

## 2018-01-30 NOTE — Progress Notes (Signed)
BP 128/88   Pulse 70   Temp 98.5 F (36.9 C) (Oral)   Wt 217 lb (98.4 kg)   SpO2 97%   BMI 32.05 kg/m    Subjective:    Patient ID: Meredith Butler, female    DOB: 09-14-1956, 62 y.o.   MRN: [id number]  HPI: Meredith Butler is a 62 y.o. female  Chief Complaint  Patient presents with  . Depression  . Fibromyalgia   Pt here for f/u fibromyalgia and breathing. Feels like muscle relaxer is helping quite a bit with her chronic joint/muscle aches, taking it like twice weekly and using heating pads as well as continued use of cymbalta. Working with Rheumatology to figure out cause of chronic joint and muscle pains as well as + ANA. Denies fevers, sweats, weight loss, red swollen joints.   Intolerant to steroid inhalers, and does not feel like the albuterol does enough. Has persistent cough, sometimes productive, and SOB. Former smoker, quit 8 years ago but has 60 pack year smoking hx. Denies CP.   Past Medical History:  Diagnosis Date  . Asthma   . Diabetes mellitus without complication ([place])   . Fibromyalgia   . Hypertension   . Lupus   . Peritonitis [facility])    Social History   Socioeconomic History  . Marital status: Married    Spouse name: Not on file  . Number of children: Not on file  . Years of education: Not on file  . Highest education level: Not on file  Social Needs  . Financial resource strain: Not on file  . Food insecurity - worry: Not on file  . Food insecurity - inability: Not on file  . Transportation needs - medical: Not on file  . Transportation needs - non-medical: Not on file  Occupational History  . Not on file  Tobacco Use  . Smoking status: Former Smoker    Packs/day: 2.00    Years: 30.00    Pack years: 60.00    Types: Cigarettes    Last attempt to quit: 11/20/2009    Years since quitting: 8.2  . Smokeless tobacco: Never Used  Substance and Sexual Activity  . Alcohol use: No    Frequency: Never  . Drug use: No  . Sexual activity: Yes  Other Topics  Concern  . Not on file  Social History Narrative  . Not on file   Relevant past medical, surgical, family and social history reviewed and updated as indicated. Interim medical history since our last visit reviewed. Allergies and medications reviewed and updated.  Review of Systems  Per HPI unless specifically indicated above     Objective:    BP 128/88   Pulse 70   Temp 98.5 F (36.9 C) (Oral)   Wt 217 lb (98.4 kg)   SpO2 97%   BMI 32.05 kg/m   Wt Readings from Last 3 Encounters:  01/30/18 217 lb (98.4 kg)  01/28/18 216 lb 6.4 oz (98.2 kg)  01/14/18 220 lb 11.2 oz (100.1 kg)    Physical Exam  Constitutional: She is oriented to person, place, and time. She appears well-developed and well-nourished. No distress.  HENT:  Head: Atraumatic.  Eyes: Conjunctivae are normal. Pupils are equal, round, and reactive to light. No scleral icterus.  Neck: Normal range of motion. Neck supple.  Cardiovascular: Normal rate and normal heart sounds.  Pulmonary/Chest: Effort normal. No respiratory distress. She has no wheezes.  Decreased breath sounds b/l   Musculoskeletal: Normal range of motion.  She exhibits no edema or deformity.  Lymphadenopathy:    She has no cervical adenopathy.  Neurological: She is alert and oriented to person, place, and time.  Skin: Skin is warm and dry. No rash noted.  Psychiatric: She has a normal mood and affect. Her behavior is normal.  Nursing note and vitals reviewed.  Results for orders placed or performed in visit on 12/24/17  Sed Rate (ESR)  Result Value Ref Range   Sed Rate 18 0 - 40 mm/hr  Rheumatoid Arthritis Profile  Result Value Ref Range   Rhuematoid fact SerPl-aCnc <10.0 0.0 - 76.5 IU/mL   Cyclic Citrullin Peptide Ab 6 0 - 19 units  ANA w/Reflex  Result Value Ref Range   Anit Nuclear Antibody(ANA) Positive (A) Negative  CBC with Differential/Platelet  Result Value Ref Range   WBC 6.1 3.4 - 10.8 x10E3/uL   RBC 4.43 3.77 - 5.28 x10E6/uL     Hemoglobin 13.6 11.1 - 15.9 g/dL   Hematocrit 39.4 34.0 - 46.6 %   MCV 89 79 - 97 fL   MCH 30.7 26.6 - 33.0 pg   MCHC 34.5 31.5 - 35.7 g/dL   RDW 13.4 12.3 - 15.4 %   Platelets 297 150 - 379 x10E3/uL   Neutrophils 65 Not Estab. %   Lymphs 21 Not Estab. %   Monocytes 8 Not Estab. %   Eos 5 Not Estab. %   Basos 1 Not Estab. %   Neutrophils Absolute 4.0 1.4 - 7.0 x10E3/uL   Lymphocytes Absolute 1.3 0.7 - 3.1 x10E3/uL   Monocytes Absolute 0.5 0.1 - 0.9 x10E3/uL   EOS (ABSOLUTE) 0.3 0.0 - 0.4 x10E3/uL   Basophils Absolute 0.1 0.0 - 0.2 x10E3/uL   Immature Granulocytes 0 Not Estab. %   Immature Grans (Abs) 0.0 0.0 - 0.1 x10E3/uL  Thyroid Panel With TSH  Result Value Ref Range   TSH 2.070 0.450 - 4.500 uIU/mL   T4, Total 7.0 4.5 - 12.0 ug/dL   T3 Uptake Ratio 28 24 - 39 %   Free Thyroxine Index 2.0 1.2 - 4.9  Comprehensive metabolic panel  Result Value Ref Range   Glucose 135 (H) 65 - 99 mg/dL   BUN 14 8 - 27 mg/dL   Creatinine, Ser 0.88 0.57 - 1.00 mg/dL   GFR calc non Af Amer 71 >59 mL/min/1.73   GFR calc Af Amer 82 >59 mL/min/1.73   BUN/Creatinine Ratio 16 12 - 28   Sodium 140 134 - 144 mmol/L   Potassium 4.1 3.5 - 5.2 mmol/L   Chloride 100 96 - 106 mmol/L   CO2 23 20 - 29 mmol/L   Calcium 9.3 8.7 - 10.3 mg/dL   Total Protein 7.3 6.0 - 8.5 g/dL   Albumin 4.3 3.6 - 4.8 g/dL   Globulin, Total 3.0 1.5 - 4.5 g/dL   Albumin/Globulin Ratio 1.4 1.2 - 2.2   Bilirubin Total 0.6 0.0 - 1.2 mg/dL   Alkaline Phosphatase 77 39 - 117 IU/L   AST 17 0 - 40 IU/L   ALT 18 0 - 32 IU/L  Vitamin B12  Result Value Ref Range   Vitamin B-12 572 232 - 1,245 pg/mL  VITAMIN D 25 Hydroxy (Vit-D Deficiency, Fractures)  Result Value Ref Range   Vit D, 25-Hydroxy 31.0 30.0 - 100.0 ng/mL  ENA+DNA/DS+Sjorgen's  Result Value Ref Range   dsDNA Ab <1 0 - 9 IU/mL   ENA RNP Ab 1.7 (H) 0.0 - 0.9 AI   ENA SM  Ab Ser-aCnc <0.2 0.0 - 0.9 AI   ENA SSA (RO) Ab <0.2 0.0 - 0.9 AI   ENA SSB (LA) Ab <0.2 0.0  - 0.9 AI   See below: Comment       Assessment & Plan:   Problem List Items Addressed This Visit      Respiratory   Asthma - Primary    Pre and post neb spirometry done today with minimal to no improvement between them. Suspect more of a COPD picture here causing her sxs. Will trial Anoro as she's intolerant to steroid inhalers and finding no relief with albuterol. Continue prn albuterol for rescue.       Relevant Medications   umeclidinium-vilanterol (ANORO ELLIPTA) 62.5-25 MCG/INH AEPB   albuterol (PROVENTIL) (2.5 MG/3ML) 0.083% nebulizer solution 2.5 mg (Completed)   Other Relevant Orders   Spirometry with Graph (Completed)     Other   Fibromyalgia    Some improvement with addition of muscle relaxers. Continue this and cymbalta. F/u with Rheumatology for further workup as scheduled          Follow up plan: Return in about 3 months (around 05/02/2018) for DM, breathing f/u.

## 2018-01-31 ENCOUNTER — Encounter: Payer: Self-pay | Admitting: Unknown Physician Specialty

## 2018-01-31 ENCOUNTER — Telehealth: Payer: Self-pay | Admitting: Unknown Physician Specialty

## 2018-01-31 ENCOUNTER — Ambulatory Visit: Payer: Self-pay | Admitting: *Deleted

## 2018-01-31 MED ORDER — GLUCOSE BLOOD VI STRP: ORAL_STRIP | 12 refills | Status: DC

## 2018-01-31 NOTE — Telephone Encounter (Signed)
Pt's husband called back to clarify request. Refill of strips completed.

## 2018-01-31 NOTE — Telephone Encounter (Signed)
Patient called, left VM to return the call to the office to clarify what is needed.

## 2018-01-31 NOTE — Telephone Encounter (Signed)
This encounter was created in error - please disregard.

## 2018-01-31 NOTE — Addendum Note (Signed)
Addended by: Elliot Cousin on: 01/31/2018 02:04 PM   Modules accepted: Orders

## 2018-01-31 NOTE — Telephone Encounter (Signed)
Copied from [place] [id number]. Topic: Quick Communication - See Telephone Encounter >> Jan 31, 2018 11:15 AM Hewitt Shorts wrote: CRM for notification. See Telephone encounter for:  Pt husband Hassell Done called to let the office know that the meter is is needing is an accu checck guide   [phone]  01/31/18.

## 2018-01-31 NOTE — Telephone Encounter (Signed)
Left message for pt to call us back regarding the glucose meter question.

## 2018-02-02 NOTE — Assessment & Plan Note (Signed)
Pre and post neb spirometry done today with minimal to no improvement between them. Suspect more of a COPD picture here causing her sxs. Will trial Anoro as she's intolerant to steroid inhalers and finding no relief with albuterol. Continue prn albuterol for rescue.

## 2018-02-02 NOTE — Patient Instructions (Signed)
Follow up in 3 months

## 2018-02-02 NOTE — Assessment & Plan Note (Signed)
Some improvement with addition of muscle relaxers. Continue this and cymbalta. F/u with Rheumatology for further workup as scheduled

## 2018-02-27 ENCOUNTER — Ambulatory Visit: Payer: BLUE CROSS/BLUE SHIELD | Admitting: Dietician

## 2018-03-24 ENCOUNTER — Ambulatory Visit: Payer: Self-pay | Admitting: *Deleted

## 2018-03-24 NOTE — Telephone Encounter (Signed)
Patient is having dizziness that is effecting her at work and home- she states she may be having changes with her COPD. She does report hand swelling that is [place] and some changes in her breathing.  Reason for Disposition . [1] MODERATE dizziness (e.g., interferes with normal activities) AND [2] has NOT been evaluated by physician for this  (Exception: dizziness caused by heat exposure, sudden standing, or poor fluid intake)  Answer Assessment - Initial Assessment Questions 1. DESCRIPTION: "Describe your dizziness."     Lightheaded when up and about 2. LIGHTHEADED: "Do you feel lightheaded?" (e.g., somewhat faint, woozy, weak upon standing)     Weak with standing- patient feeling bad for last 2 weeks 3. VERTIGO: "Do you feel like either you or the room is spinning or tilting?" (i.e. vertigo)     patient loses balance 4. SEVERITY: "How bad is it?"  "Do you feel like you are going to faint?" "Can you stand and walk?"   - MILD - walking normally   - MODERATE - interferes with normal activities (e.g., work, school)    - SEVERE - unable to stand, requires support to walk, feels like passing out now.      Patient is having trouble breathing at times 5. ONSET:  "When did the dizziness begin?"     2 weeks 6. AGGRAVATING FACTORS: "Does anything make it worse?" (e.g., standing, change in head position)     no 7. HEART RATE: "Can you tell me your heart rate?" "How many beats in 15 seconds?"  (Note: not all patients can do this)       112/min 8. CAUSE: "What do you think is causing the dizziness?"     COPD- hand swells and arm tingles and goes numb- 2 weeks 9. RECURRENT SYMPTOM: "Have you had dizziness before?" If so, ask: "When was the last time?" "What happened that time?"     no 10. OTHER SYMPTOMS: "Do you have any other symptoms?" (e.g., fever, chest pain, vomiting, diarrhea, bleeding)       Patient does have sweating and chest pain- she takes ASA when she has the pain 11. PREGNANCY: "Is there  any chance you are pregnant?" "When was your last menstrual period?"       n/a  Protocols used: DIZZINESS [facility]

## 2018-03-25 ENCOUNTER — Encounter: Payer: Self-pay | Admitting: Family Medicine

## 2018-03-25 ENCOUNTER — Ambulatory Visit: Payer: BLUE CROSS/BLUE SHIELD | Admitting: Family Medicine

## 2018-03-25 DIAGNOSIS — E119 Type 2 diabetes mellitus without complications: Secondary | ICD-10-CM | POA: Diagnosis not present

## 2018-03-25 DIAGNOSIS — J4540 Moderate persistent asthma, uncomplicated: Secondary | ICD-10-CM | POA: Diagnosis not present

## 2018-03-25 DIAGNOSIS — I10 Essential (primary) hypertension: Secondary | ICD-10-CM | POA: Diagnosis not present

## 2018-03-25 MED ORDER — AZITHROMYCIN 250 MG PO TABS: ORAL_TABLET | ORAL | 0 refills | Status: DC

## 2018-03-25 NOTE — Assessment & Plan Note (Signed)
Some elevation today but otherwise doing well

## 2018-03-25 NOTE — Assessment & Plan Note (Signed)
Stable no complaints.

## 2018-03-25 NOTE — Progress Notes (Signed)
BP (!) 143/89   Pulse 76   Ht _0  (1.676 m)   Wt 218 lb (98.9 kg)   SpO2 96%   BMI 35.19 kg/m    Subjective:    Patient ID: Meredith Butler, female    DOB: [birth year], 62 y.o.   MRN: [id number]  HPI: Meredith Butler is a 62 y.o. female  Chief Complaint  Patient presents with  . COPD  Patient complaints of worsening cough over the last 3 weeks developed after allergy exposure and symptoms has been using inhalers with minimal relief coughing up thick sputum.  Patient consistently getting worse over these last 3 weeks missed work earlier in the week. Diabetes blood pressure has been apparently doing well with no complaints taking medications without problems.  Relevant past medical, surgical, family and social history reviewed and updated as indicated. Interim medical history since our last visit reviewed. Allergies and medications reviewed and updated.  Review of Systems  Constitutional: Positive for diaphoresis and fatigue.  Respiratory: Positive for cough. Negative for shortness of breath.   Cardiovascular: Negative.     Per HPI unless specifically indicated above     Objective:    BP (!) 143/89   Pulse 76   Ht _1  (1.676 m)   Wt 218 lb (98.9 kg)   SpO2 96%   BMI 35.19 kg/m   Wt Readings from Last 3 Encounters:  03/25/18 218 lb (98.9 kg)  01/30/18 217 lb (98.4 kg)  01/28/18 216 lb 6.4 oz (98.2 kg)    Physical Exam  Constitutional: She is oriented to person, place, and time. She appears well-developed and well-nourished.  HENT:  Head: Normocephalic and atraumatic.  Eyes: Conjunctivae and EOM are normal.  Neck: Normal range of motion.  Cardiovascular: Normal rate, regular rhythm and normal heart sounds.  Pulmonary/Chest: Effort normal and breath sounds normal.  Musculoskeletal: Normal range of motion.  Neurological: She is alert and oriented to person, place, and time.  Skin: No erythema.  Psychiatric: She has a normal mood and affect. Her behavior is normal.  Judgment and thought content normal.    Results for orders placed or performed in visit on 12/24/17  Sed Rate (ESR)  Result Value Ref Range   Sed Rate 18 0 - 40 mm/hr  Rheumatoid Arthritis Profile  Result Value Ref Range   Rhuematoid fact SerPl-aCnc <10.0 0.0 - 38.4 IU/mL   Cyclic Citrullin Peptide Ab 6 0 - 19 units  ANA w/Reflex  Result Value Ref Range   Anit Nuclear Antibody(ANA) Positive (A) Negative  CBC with Differential/Platelet  Result Value Ref Range   WBC 6.1 3.4 - 10.8 x10E3/uL   RBC 4.43 3.77 - 5.28 x10E6/uL   Hemoglobin 13.6 11.1 - 15.9 g/dL   Hematocrit 39.4 34.0 - 46.6 %   MCV 89 79 - 97 fL   MCH 30.7 26.6 - 33.0 pg   MCHC 34.5 31.5 - 35.7 g/dL   RDW 13.4 12.3 - 15.4 %   Platelets 297 150 - 379 x10E3/uL   Neutrophils 65 Not Estab. %   Lymphs 21 Not Estab. %   Monocytes 8 Not Estab. %   Eos 5 Not Estab. %   Basos 1 Not Estab. %   Neutrophils Absolute 4.0 1.4 - 7.0 x10E3/uL   Lymphocytes Absolute 1.3 0.7 - 3.1 x10E3/uL   Monocytes Absolute 0.5 0.1 - 0.9 x10E3/uL   EOS (ABSOLUTE) 0.3 0.0 - 0.4 x10E3/uL   Basophils Absolute 0.1 0.0 - 0.2 x10E3/uL  Immature Granulocytes 0 Not Estab. %   Immature Grans (Abs) 0.0 0.0 - 0.1 x10E3/uL  Thyroid Panel With TSH  Result Value Ref Range   TSH 2.070 0.450 - 4.500 uIU/mL   T4, Total 7.0 4.5 - 12.0 ug/dL   T3 Uptake Ratio 28 24 - 39 %   Free Thyroxine Index 2.0 1.2 - 4.9  Comprehensive metabolic panel  Result Value Ref Range   Glucose 135 (H) 65 - 99 mg/dL   BUN 14 8 - 27 mg/dL   Creatinine, Ser 0.88 0.57 - 1.00 mg/dL   GFR calc non Af Amer 71 >59 mL/min/1.73   GFR calc Af Amer 82 >59 mL/min/1.73   BUN/Creatinine Ratio 16 12 - 28   Sodium 140 134 - 144 mmol/L   Potassium 4.1 3.5 - 5.2 mmol/L   Chloride 100 96 - 106 mmol/L   CO2 23 20 - 29 mmol/L   Calcium 9.3 8.7 - 10.3 mg/dL   Total Protein 7.3 6.0 - 8.5 g/dL   Albumin 4.3 3.6 - 4.8 g/dL   Globulin, Total 3.0 1.5 - 4.5 g/dL   Albumin/Globulin Ratio 1.4 1.2 -  2.2   Bilirubin Total 0.6 0.0 - 1.2 mg/dL   Alkaline Phosphatase 77 39 - 117 IU/L   AST 17 0 - 40 IU/L   ALT 18 0 - 32 IU/L  Vitamin B12  Result Value Ref Range   Vitamin B-12 572 232 - 1,245 pg/mL  VITAMIN D 25 Hydroxy (Vit-D Deficiency, Fractures)  Result Value Ref Range   Vit D, 25-Hydroxy 31.0 30.0 - 100.0 ng/mL  ENA+DNA/DS+Sjorgen's  Result Value Ref Range   dsDNA Ab <1 0 - 9 IU/mL   ENA RNP Ab 1.7 (H) 0.0 - 0.9 AI   ENA SM Ab Ser-aCnc <0.2 0.0 - 0.9 AI   ENA SSA (RO) Ab <0.2 0.0 - 0.9 AI   ENA SSB (LA) Ab <0.2 0.0 - 0.9 AI   See below: Comment       Assessment & Plan:   Problem List Items Addressed This Visit      Cardiovascular and Mediastinum   Essential hypertension, benign    Some elevation today but otherwise doing well        Respiratory   Asthma    Acute exacerbation of his Z-Pak patient had on using antihistamine such as Claritin        Endocrine   Type 2 diabetes mellitus without complication, without long-term current use of insulin (HCC)    Stable no complaints       Need to work excuse but work requires [employer] which will be filled out for allergies for earlier this week for the 2 days that were missed.  Follow up plan: Return for As scheduled.

## 2018-03-25 NOTE — Assessment & Plan Note (Signed)
Acute exacerbation of his Z-Pak patient had on using antihistamine such as Claritin

## 2018-04-23 ENCOUNTER — Encounter: Payer: Self-pay | Admitting: Unknown Physician Specialty

## 2018-04-23 ENCOUNTER — Ambulatory Visit (INDEPENDENT_AMBULATORY_CARE_PROVIDER_SITE_OTHER): Payer: BLUE CROSS/BLUE SHIELD | Admitting: Unknown Physician Specialty

## 2018-04-23 VITALS — BP 131/84 | HR 76 | Temp 98.5°F | Ht 66.0 in | Wt 215.8 lb

## 2018-04-23 DIAGNOSIS — R5383 Other fatigue: Secondary | ICD-10-CM

## 2018-04-23 DIAGNOSIS — F321 Major depressive disorder, single episode, moderate: Secondary | ICD-10-CM | POA: Diagnosis not present

## 2018-04-23 DIAGNOSIS — I10 Essential (primary) hypertension: Secondary | ICD-10-CM | POA: Diagnosis not present

## 2018-04-23 DIAGNOSIS — M797 Fibromyalgia: Secondary | ICD-10-CM | POA: Diagnosis not present

## 2018-04-23 DIAGNOSIS — E7800 Pure hypercholesterolemia, unspecified: Secondary | ICD-10-CM

## 2018-04-23 DIAGNOSIS — E119 Type 2 diabetes mellitus without complications: Secondary | ICD-10-CM | POA: Diagnosis not present

## 2018-04-23 DIAGNOSIS — J4540 Moderate persistent asthma, uncomplicated: Secondary | ICD-10-CM

## 2018-04-23 LAB — BAYER DCA HB A1C WAIVED: HB A1C (BAYER DCA - WAIVED): 5.8 % (ref <7.0)

## 2018-04-23 MED ORDER — METFORMIN HCL 500 MG PO TABS
500.0000 mg | ORAL_TABLET | Freq: Every day | ORAL | 2 refills | Status: DC
Start: 2018-04-23 — End: 2018-06-03

## 2018-04-23 MED ORDER — DULOXETINE HCL 60 MG PO CPEP: 60.0000 mg | ORAL_CAPSULE | Freq: Every day | ORAL | 3 refills | Status: DC

## 2018-04-23 MED ORDER — CYCLOBENZAPRINE HCL 10 MG PO TABS: 10.0000 mg | ORAL_TABLET | Freq: Three times a day (TID) | ORAL | 1 refills | Status: DC | PRN

## 2018-04-23 MED ORDER — LISINOPRIL-HYDROCHLOROTHIAZIDE 20-12.5 MG PO TABS: 1.0000 | ORAL_TABLET | Freq: Every day | ORAL | 1 refills | Status: DC

## 2018-04-23 MED ORDER — ATORVASTATIN CALCIUM 20 MG PO TABS: 20.0000 mg | ORAL_TABLET | Freq: Every day | ORAL | 1 refills | Status: DC

## 2018-04-23 MED ORDER — UMECLIDINIUM-VILANTEROL 62.5-25 MCG/INH IN AEPB: 1.0000 | INHALATION_SPRAY | Freq: Every day | RESPIRATORY_TRACT | 6 refills | Status: DC

## 2018-04-23 NOTE — Assessment & Plan Note (Signed)
BP came down to 131/84 on second reading.  Stable.  Continue present medications.  Went to diabetes education

## 2018-04-23 NOTE — Progress Notes (Addendum)
BP 131/84 (BP Location: Left Arm, Cuff Size: Normal)   Pulse 76   Temp 98.5 F (36.9 C) (Oral)   Ht _0  (1.676 m)   Wt 215 lb 12.8 oz (97.9 kg)   SpO2 98%   BMI 34.83 kg/m    Subjective:    Patient ID: Meredith Butler, female    DOB: 30-Sep-1956, 62 y.o.   MRN: [id number]  HPI: Meredith Butler is a 62 y.o. female  Chief Complaint  Patient presents with  . Diabetes  . Hypertension  . Pain  . Spasms    pt states at night, her legs have been spasming very bad, flexeril is not helping  . Hearing Problem    pt states she hears a constant beeping in her ears   Diabetes: Using medications without difficulties No hypoglycemic episodes No hyperglycemic episodes Feet problems: none but they swell and itch Blood Sugars averaging: Blood sugars 102-164 eye exam within last year Last Hgb A1C: 6.7  Hypertension  Using medications without difficulty Average home BPs Not checking   Using medication without problems or lightheadedness No chest pain with exertion or shortness of breath No Edema  Elevated Cholesterol Using medications without problems No Muscle aches  Diet: Exercise: Not exercising as fatigued after a day at work  Asthma Doing well but will need to take inhaler at work at times.   Depression Sister passed away 2 years ago.  Was not able to go to her funeral and lately struggling with the loss. Lately she has been tearful.  On Cymbalta 30 mg.     Depression screen [facility] 2/9 04/23/2018 01/30/2018 01/14/2018 12/24/2017  Decreased Interest 1 0 1 0  Down, Depressed, Hopeless 1 0 0 1  PHQ - 2 Score 2 0 1 1  Altered sleeping 2 1 - 1  Tired, decreased energy 2 1 - 1  Change in appetite 1 1 - 1  Feeling bad or failure about yourself  1 0 - 0  Trouble concentrating 1 0 - 0  Moving slowly or fidgety/restless 0 0 - 0  Suicidal thoughts 0 0 - 0  PHQ-9 Score 9 3 - 4      Relevant past medical, surgical, family and social history reviewed and updated as indicated. Interim medical  history since our last visit reviewed. Allergies and medications reviewed and updated.  Review of Systems  Per HPI unless specifically indicated above     Objective:    BP 131/84 (BP Location: Left Arm, Cuff Size: Normal)   Pulse 76   Temp 98.5 F (36.9 C) (Oral)   Ht _1  (1.676 m)   Wt 215 lb 12.8 oz (97.9 kg)   SpO2 98%   BMI 34.83 kg/m   Wt Readings from Last 3 Encounters:  04/23/18 215 lb 12.8 oz (97.9 kg)  03/25/18 218 lb (98.9 kg)  01/30/18 217 lb (98.4 kg)    Physical Exam  Constitutional: She is oriented to person, place, and time. She appears well-developed and well-nourished. No distress.  HENT:  Head: Normocephalic and atraumatic.  Eyes: Conjunctivae and lids are normal. Right eye exhibits no discharge. Left eye exhibits no discharge. No scleral icterus.  Neck: Normal range of motion. Neck supple. No JVD present. Carotid bruit is not present.  Cardiovascular: Normal rate, regular rhythm and normal heart sounds.  Pulmonary/Chest: Effort normal and breath sounds normal.  Abdominal: Normal appearance. There is no splenomegaly or hepatomegaly.  Musculoskeletal: Normal range of motion.  Neurological: She is  alert and oriented to person, place, and time.  Skin: Skin is warm, dry and intact. No rash noted. No pallor.  Psychiatric: She has a normal mood and affect. Her behavior is normal. Judgment and thought content normal.    Results for orders placed or performed in visit on 12/24/17  Sed Rate (ESR)  Result Value Ref Range   Sed Rate 18 0 - 40 mm/hr  Rheumatoid Arthritis Profile  Result Value Ref Range   Rhuematoid fact SerPl-aCnc <10.0 0.0 - 63.0 IU/mL   Cyclic Citrullin Peptide Ab 6 0 - 19 units  ANA w/Reflex  Result Value Ref Range   Anti Nuclear Antibody(ANA) Positive (A) Negative  CBC with Differential/Platelet  Result Value Ref Range   WBC 6.1 3.4 - 10.8 x10E3/uL   RBC 4.43 3.77 - 5.28 x10E6/uL   Hemoglobin 13.6 11.1 - 15.9 g/dL   Hematocrit 39.4  34.0 - 46.6 %   MCV 89 79 - 97 fL   MCH 30.7 26.6 - 33.0 pg   MCHC 34.5 31.5 - 35.7 g/dL   RDW 13.4 12.3 - 15.4 %   Platelets 297 150 - 379 x10E3/uL   Neutrophils 65 Not Estab. %   Lymphs 21 Not Estab. %   Monocytes 8 Not Estab. %   Eos 5 Not Estab. %   Basos 1 Not Estab. %   Neutrophils Absolute 4.0 1.4 - 7.0 x10E3/uL   Lymphocytes Absolute 1.3 0.7 - 3.1 x10E3/uL   Monocytes Absolute 0.5 0.1 - 0.9 x10E3/uL   EOS (ABSOLUTE) 0.3 0.0 - 0.4 x10E3/uL   Basophils Absolute 0.1 0.0 - 0.2 x10E3/uL   Immature Granulocytes 0 Not Estab. %   Immature Grans (Abs) 0.0 0.0 - 0.1 x10E3/uL  Thyroid Panel With TSH  Result Value Ref Range   TSH 2.070 0.450 - 4.500 uIU/mL   T4, Total 7.0 4.5 - 12.0 ug/dL   T3 Uptake Ratio 28 24 - 39 %   Free Thyroxine Index 2.0 1.2 - 4.9  Comprehensive metabolic panel  Result Value Ref Range   Glucose 135 (H) 65 - 99 mg/dL   BUN 14 8 - 27 mg/dL   Creatinine, Ser 0.88 0.57 - 1.00 mg/dL   GFR calc non Af Amer 71 >59 mL/min/1.73   GFR calc Af Amer 82 >59 mL/min/1.73   BUN/Creatinine Ratio 16 12 - 28   Sodium 140 134 - 144 mmol/L   Potassium 4.1 3.5 - 5.2 mmol/L   Chloride 100 96 - 106 mmol/L   CO2 23 20 - 29 mmol/L   Calcium 9.3 8.7 - 10.3 mg/dL   Total Protein 7.3 6.0 - 8.5 g/dL   Albumin 4.3 3.6 - 4.8 g/dL   Globulin, Total 3.0 1.5 - 4.5 g/dL   Albumin/Globulin Ratio 1.4 1.2 - 2.2   Bilirubin Total 0.6 0.0 - 1.2 mg/dL   Alkaline Phosphatase 77 39 - 117 IU/L   AST 17 0 - 40 IU/L   ALT 18 0 - 32 IU/L  Vitamin B12  Result Value Ref Range   Vitamin B-12 572 232 - 1,245 pg/mL  VITAMIN D 25 Hydroxy (Vit-D Deficiency, Fractures)  Result Value Ref Range   Vit D, 25-Hydroxy 31.0 30.0 - 100.0 ng/mL  ENA+DNA/DS+Sjorgen's  Result Value Ref Range   dsDNA Ab <1 0 - 9 IU/mL   ENA RNP Ab 1.7 (H) 0.0 - 0.9 AI   ENA SM Ab Ser-aCnc <0.2 0.0 - 0.9 AI   ENA SSA (RO) Ab <0.2 0.0 - 0.9  AI   ENA SSB (LA) Ab <0.2 0.0 - 0.9 AI   See below: Comment       Assessment &  Plan:   Problem List Items Addressed This Visit      Unprioritized   Asthma    Instructed to take 2 puffs of Proair rather than 1 she had been taking with incomplete relief of symptoms      Relevant Medications   umeclidinium-vilanterol (ANORO ELLIPTA) 62.5-25 MCG/INH AEPB   Depression, major, single episode, moderate (HCC)    Increase Cymbalta from 30 mg to 60 mg.        Relevant Medications   DULoxetine (CYMBALTA) 60 MG capsule   Essential hypertension, benign    BP came down to 131/84 on second reading.  Stable.  Continue present medications.  Went to diabetes education      Relevant Medications   atorvastatin (LIPITOR) 20 MG tablet   lisinopril-hydrochlorothiazide (PRINZIDE,ZESTORETIC) 20-12.5 MG tablet   Other Relevant Orders   Comprehensive metabolic panel   Fibromyalgia    Pt feels Cymbalta not helping.  Taking 30 mg. Will increase to 60 mg.  Will seek employee assistance counseling.        Hypercholesteremia   Relevant Medications   atorvastatin (LIPITOR) 20 MG tablet   lisinopril-hydrochlorothiazide (PRINZIDE,ZESTORETIC) 20-12.5 MG tablet   Other Relevant Orders   Lipid Panel w/o Chol/HDL Ratio   Type 2 diabetes mellitus without complication, without long-term current use of insulin (HCC) - Primary    Hgb A1C is 5.8% Discussed pluses and minuses of medication.  Decided to continue for now but may decide to stop      Relevant Medications   metFORMIN (GLUCOPHAGE) 500 MG tablet   atorvastatin (LIPITOR) 20 MG tablet   lisinopril-hydrochlorothiazide (PRINZIDE,ZESTORETIC) 20-12.5 MG tablet   Other Relevant Orders   Bayer DCA Hb A1c Waived    Other Visit Diagnoses    Fatigue, unspecified type       Relevant Orders   TSH      Greater than 50% of this  40 minute visit was spent in counseling/coordination of care regarding depression, diabetes, and BP   Follow up plan: Return in about 5 weeks (around 05/28/2018) for Depression and needs physical.

## 2018-04-23 NOTE — Assessment & Plan Note (Signed)
Instructed to take 2 puffs of Proair rather than 1 she had been taking with incomplete relief of symptoms

## 2018-04-23 NOTE — Assessment & Plan Note (Addendum)
Pt feels Cymbalta not helping.  Taking 30 mg. Will increase to 60 mg.  Will seek employee assistance counseling.

## 2018-04-23 NOTE — Assessment & Plan Note (Signed)
Increase Cymbalta from 30 mg to 60 mg.

## 2018-04-23 NOTE — Assessment & Plan Note (Signed)
Hgb A1C is 5.8% Discussed pluses and minuses of medication.  Decided to continue for now but may decide to stop

## 2018-04-24 LAB — COMPREHENSIVE METABOLIC PANEL
ALK PHOS: 82 IU/L (ref 39–117)
ALT: 19 IU/L (ref 0–32)
AST: 22 IU/L (ref 0–40)
Albumin/Globulin Ratio: 1.6 (ref 1.2–2.2)
Albumin: 4.2 g/dL (ref 3.6–4.8)
BILIRUBIN TOTAL: 0.6 mg/dL (ref 0.0–1.2)
BUN/Creatinine Ratio: 15 (ref 12–28)
BUN: 11 mg/dL (ref 8–27)
CO2: 27 mmol/L (ref 20–29)
CREATININE: 0.74 mg/dL (ref 0.57–1.00)
Calcium: 9.4 mg/dL (ref 8.7–10.3)
Chloride: 101 mmol/L (ref 96–106)
GFR calc Af Amer: 101 mL/min/{1.73_m2} (ref >59)
GFR calc non Af Amer: 88 mL/min/{1.73_m2} (ref >59)
GLOBULIN, TOTAL: 2.7 g/dL (ref 1.5–4.5)
Glucose: 122 mg/dL — ABNORMAL HIGH (ref 65–99)
Potassium: 3.9 mmol/L (ref 3.5–5.2)
Sodium: 140 mmol/L (ref 134–144)
Total Protein: 6.9 g/dL (ref 6.0–8.5)

## 2018-04-24 LAB — LIPID PANEL W/O CHOL/HDL RATIO
CHOLESTEROL TOTAL: 128 mg/dL (ref 100–199)
HDL: 44 mg/dL (ref >39)
LDL CALC: 55 mg/dL (ref 0–99)
Triglycerides: 145 mg/dL (ref 0–149)
VLDL Cholesterol Cal: 29 mg/dL (ref 5–40)

## 2018-04-24 LAB — TSH: TSH: 2.33 u[IU]/mL (ref 0.450–4.500)

## 2018-04-25 ENCOUNTER — Encounter: Payer: Self-pay | Admitting: Unknown Physician Specialty

## 2018-06-03 ENCOUNTER — Telehealth: Payer: Self-pay | Admitting: Unknown Physician Specialty

## 2018-06-03 MED ORDER — MELOXICAM 15 MG PO TABS: 15.0000 mg | ORAL_TABLET | Freq: Every day | ORAL | 0 refills | Status: DC

## 2018-06-03 MED ORDER — METFORMIN HCL 500 MG PO TABS: 500.0000 mg | ORAL_TABLET | Freq: Every day | ORAL | 2 refills | Status: DC

## 2018-06-03 NOTE — Telephone Encounter (Signed)
Copied from [place] [id number]. Topic: Quick Communication - Rx Refill/Question >> Jun 03, 2018  8:18 AM Synthia Innocent wrote: Medication: meloxicam (MOBIC) 15 MG tablet and metFORMIN (GLUCOPHAGE) 500 MG tablet Has the patient contacted their pharmacy? Yes.   (Agent: If no, request that the patient contact the pharmacy for the refill.) (Agent: If yes, when and what did the pharmacy advise?)  Preferred Pharm[facility] (with phone number [facility] street [facility] [place] Please be advised that RX refills may take up to 3 business days. We ask that you follow-up with your pharmacy.

## 2018-06-03 NOTE — Telephone Encounter (Signed)
Meloxicam (Mobic) refill Last Refilled by historical provider Last OV: 04/23/18 PCP: Regino Schultze [facility] [place]  Left message for pt to [facility] [place] to have prescription of Metformin transferred [facility] the [facility] location.

## 2018-06-04 ENCOUNTER — Ambulatory Visit: Payer: BLUE CROSS/BLUE SHIELD | Admitting: Unknown Physician Specialty

## 2018-06-11 ENCOUNTER — Encounter: Payer: BLUE CROSS/BLUE SHIELD | Admitting: Unknown Physician Specialty

## 2018-06-23 ENCOUNTER — Telehealth: Payer: Self-pay | Admitting: *Deleted

## 2018-06-23 ENCOUNTER — Other Ambulatory Visit: Payer: Self-pay | Admitting: *Deleted

## 2018-06-23 DIAGNOSIS — Z87891 Personal history of nicotine dependence: Secondary | ICD-10-CM

## 2018-06-23 DIAGNOSIS — Z122 Encounter for screening for malignant neoplasm of respiratory organs: Secondary | ICD-10-CM

## 2018-06-23 NOTE — Telephone Encounter (Signed)
error 

## 2018-06-23 NOTE — Telephone Encounter (Signed)
Received referral for initial lung cancer screening scan. Contacted patient and obtained smoking history,(former, quit 11/20/09, 60 pack year) as well as answering questions related to screening process. Patient denies signs of lung cancer such as weight loss or hemoptysis. Patient denies comorbidity that would prevent curative treatment if lung cancer were found. Patient is scheduled for shared decision making visit and CT scan on 07/03/18.

## 2018-06-24 ENCOUNTER — Telehealth: Payer: Self-pay | Admitting: Nurse Practitioner

## 2018-06-26 ENCOUNTER — Inpatient Hospital Stay: Payer: BLUE CROSS/BLUE SHIELD | Attending: Nurse Practitioner | Admitting: Nurse Practitioner

## 2018-06-26 DIAGNOSIS — Z87891 Personal history of nicotine dependence: Secondary | ICD-10-CM

## 2018-06-26 NOTE — Progress Notes (Signed)
In accordance with CMS guidelines, patient has met eligibility criteria including age, absence of signs or symptoms of lung cancer.  Social History   Tobacco Use  . Smoking status: Former Smoker    Packs/day: 2.00    Years: 30.00    Pack years: 60.00    Types: Cigarettes    Last attempt to quit: 11/20/2009    Years since quitting: 8.6  . Smokeless tobacco: Never Used  Substance Use Topics  . Alcohol use: No    Frequency: Never  . Drug use: No      A shared decision-making session was conducted prior to the performance of CT scan. This includes one or more decision aids, includes benefits and harms of screening, follow-up diagnostic testing, over-diagnosis, false positive rate, and total radiation exposure.   Counseling on the importance of adherence to annual lung cancer LDCT screening, impact of co-morbidities, and ability or willingness to undergo diagnosis and treatment is imperative for compliance of the program.   Counseling on the importance of continued smoking cessation for former smokers; the importance of smoking cessation for current smokers, and information about tobacco cessation interventions have been given to patient including [place] and 1800 quit [place] programs.   Written order for lung cancer screening with LDCT has been given to the patient and any and all questions have been answered to the best of my abilities.    Yearly follow up will be coordinated by Burgess Estelle, Thoracic Navigator.  Beckey Rutter, DNP, AGNP-C [place] at [facility] [phone] (work cell) [phone] (office) 06/26/18 4:12 PM

## 2018-07-03 ENCOUNTER — Ambulatory Visit
Admission: RE | Admit: 2018-07-03 | Discharge: 2018-07-03 | Disposition: A | Discharge status: Home / Self Care | Payer: BLUE CROSS/BLUE SHIELD | Source: Ambulatory Visit | Attending: Nurse Practitioner | Admitting: Nurse Practitioner

## 2018-07-03 DIAGNOSIS — I2510 Atherosclerotic heart disease of native coronary artery without angina pectoris: Secondary | ICD-10-CM | POA: Insufficient documentation

## 2018-07-03 DIAGNOSIS — Z122 Encounter for screening for malignant neoplasm of respiratory organs: Secondary | ICD-10-CM

## 2018-07-03 DIAGNOSIS — Z87891 Personal history of nicotine dependence: Secondary | ICD-10-CM | POA: Insufficient documentation

## 2018-07-03 DIAGNOSIS — I700 Atherosclerosis of aorta: Secondary | ICD-10-CM | POA: Diagnosis not present

## 2018-07-03 DIAGNOSIS — J439 Emphysema, unspecified: Secondary | ICD-10-CM | POA: Diagnosis not present

## 2018-07-08 ENCOUNTER — Telehealth: Payer: Self-pay | Admitting: *Deleted

## 2018-07-08 NOTE — Telephone Encounter (Signed)
Attempted to contact patient to discuss LDCT lung cancer screening results.  Unable to reach patient at this time.  Left Message for them to return call to [phone]  to either myself or Shawn Perkins RN to discuss the results.    

## 2018-07-08 NOTE — Telephone Encounter (Signed)
Patient called in for LDCT scan results.  Discussed that there was evidence of nodules on her scan that could be related to other things but that the scan did recommend 3 month follow up with another scan and that we would let her PCP be aware of the scan results.  Encouraged patient to call for questions or concerns.  Voiced understanding.    IMPRESSION: 1. Lung-RADS 4A, suspicious. Follow up low-dose chest CT without contrast in 3 months (please use the following order, "CT CHEST LCS NODULE FOLLOW-UP W/O CM") is recommended. Alternatively, PET may be considered when there is a solid component 25mm or larger. 2. Aortic Atherosclerosis (ICD10-I70.0) and Emphysema (ICD10-J43.9). 3. LAD coronary artery atherosclerotic calcifications.

## 2018-08-22 ENCOUNTER — Other Ambulatory Visit: Payer: Self-pay | Admitting: Unknown Physician Specialty

## 2018-08-22 MED ORDER — PROAIR HFA 108 (90 BASE) MCG/ACT IN AERS: 2.0000 | INHALATION_SPRAY | RESPIRATORY_TRACT | 1 refills | Status: DC | PRN

## 2018-08-23 ENCOUNTER — Other Ambulatory Visit: Payer: Self-pay | Admitting: Unknown Physician Specialty

## 2018-08-25 NOTE — Telephone Encounter (Signed)
Requested Prescriptions  Pending Prescriptions Disp Refills  . meloxicam (MOBIC) 15 MG tablet [Pharmacy Med Name: MELOXICAM 15 MG TABLET] 90 tablet 0    Sig: TAKE 1 TABLET BY MOUTH EVERY DAY     Analgesics:  COX2 Inhibitors Passed - 08/23/2018  9:11 AM      Passed - HGB in normal range and within 360 days    Hemoglobin  Date Value Ref Range Status  12/24/2017 13.6 11.1 - 15.9 g/dL Final         Passed - Cr in normal range and within 360 days    Creatinine, Ser  Date Value Ref Range Status  04/23/2018 0.74 0.57 - 1.00 mg/dL Final         Passed - Patient is not pregnant      Passed - Valid encounter within last 12 months    Recent Outpatient Visits          4 months ago Type 2 diabetes mellitus without complication, without long-term current use of insulin ([place])   [place], Malachy Mood, NP   5 months ago Type 2 diabetes mellitus without complication, without long-term current use of insulin ([place])   [place] [facility], Jeannette How, MD   6 months ago Moderate persistent asthma, unspecified whether complicated   [place], [place], [place]   8 months ago Type 2 diabetes mellitus without complication, without long-term current use of insulin ([place])   [facility] Kathrine Haddock, NP

## 2018-09-20 ENCOUNTER — Telehealth: Payer: Self-pay

## 2018-09-20 NOTE — Telephone Encounter (Signed)
Call pt regarding lung screening. Pt is a former smoker. Pt would like Tuesday, Wed, Thursday midmorning 10:30 to 2. Pt has no current health issues.

## 2018-09-22 ENCOUNTER — Telehealth: Payer: Self-pay | Admitting: *Deleted

## 2018-09-22 DIAGNOSIS — Z122 Encounter for screening for malignant neoplasm of respiratory organs: Secondary | ICD-10-CM

## 2018-09-22 NOTE — Telephone Encounter (Signed)
Patient has been notified that the annual lung cancer screening low dose CT scan is due currently or will be in the near future.  Confirmed that the patient is within the age range of 88-80, and asymptomatic, and currently exhibits no signs or symptoms of lung cancer.  Patient denies illness that would prevent curative treatment for lung cancer if found.  Verified smoking history, former smoker quit in 2011, 60 pkyr history .  The shared decision making visit was completed on 07-03-18.   Patient is agreeable for the CT scan to be scheduled.  Will call patient back with date and time of appointment.

## 2018-09-22 NOTE — Telephone Encounter (Signed)
Called patient to inform her of her appt for ldct screening on11/20/2019 to please arrive @ 10:35am here @ [facility], voiced understanding.

## 2018-10-08 ENCOUNTER — Ambulatory Visit
Admission: RE | Admit: 2018-10-08 | Discharge: 2018-10-08 | Disposition: A | Discharge status: Home / Self Care | Payer: BLUE CROSS/BLUE SHIELD | Source: Ambulatory Visit | Attending: Nurse Practitioner | Admitting: Nurse Practitioner

## 2018-10-08 DIAGNOSIS — I700 Atherosclerosis of aorta: Secondary | ICD-10-CM | POA: Diagnosis not present

## 2018-10-08 DIAGNOSIS — R918 Other nonspecific abnormal finding of lung field: Secondary | ICD-10-CM | POA: Diagnosis not present

## 2018-10-08 DIAGNOSIS — I2510 Atherosclerotic heart disease of native coronary artery without angina pectoris: Secondary | ICD-10-CM | POA: Insufficient documentation

## 2018-10-08 DIAGNOSIS — J984 Other disorders of lung: Secondary | ICD-10-CM | POA: Diagnosis not present

## 2018-10-08 DIAGNOSIS — Z122 Encounter for screening for malignant neoplasm of respiratory organs: Secondary | ICD-10-CM | POA: Insufficient documentation

## 2018-10-08 DIAGNOSIS — J432 Centrilobular emphysema: Secondary | ICD-10-CM | POA: Diagnosis not present

## 2018-10-13 ENCOUNTER — Encounter: Payer: Self-pay | Admitting: Nurse Practitioner

## 2018-10-13 DIAGNOSIS — I700 Atherosclerosis of aorta: Secondary | ICD-10-CM | POA: Insufficient documentation

## 2018-10-14 ENCOUNTER — Telehealth: Payer: Self-pay | Admitting: *Deleted

## 2018-10-14 NOTE — Telephone Encounter (Signed)
Notified patient of LDCT lung cancer screening program results with recommendation for 12 month follow up imaging. Also notified of incidental findings noted below and is encouraged to discuss further with PCP who will receive a copy of this note and/or the CT report. Patient verbalizes understanding.   IMPRESSION: 1. Lung-RADS 2, benign appearance or behavior. Continue annual screening with low-dose chest CT without contrast in 12 months. 2.  Emphysema (ICD10-J43.9). 3. Aortic atherosclerosis (ICD10-170.0). Coronary artery calcification.

## 2018-11-25 ENCOUNTER — Other Ambulatory Visit: Payer: Self-pay | Admitting: Unknown Physician Specialty

## 2018-11-25 NOTE — Telephone Encounter (Signed)
Requested Prescriptions  Pending Prescriptions Disp Refills  . meloxicam (MOBIC) 15 MG tablet [Pharmacy Med Name: MELOXICAM 15 MG TABLET] 90 tablet 0    Sig: TAKE 1 TABLET BY MOUTH EVERY DAY     Analgesics:  COX2 Inhibitors Passed - 11/25/2018  1:34 AM      Passed - HGB in normal range and within 360 days    Hemoglobin  Date Value Ref Range Status  12/24/2017 13.6 11.1 - 15.9 g/dL Final         Passed - Cr in normal range and within 360 days    Creatinine, Ser  Date Value Ref Range Status  04/23/2018 0.74 0.57 - 1.00 mg/dL Final         Passed - Patient is not pregnant      Passed - Valid encounter within last 12 months    Recent Outpatient Visits          7 months ago Type 2 diabetes mellitus without complication, without long-term current use of insulin ([place])   [place], Malachy Mood, NP   8 months ago Type 2 diabetes mellitus without complication, without long-term current use of insulin ([place])   [place] [facility], Jeannette How, MD   9 months ago Moderate persistent asthma, unspecified whether complicated   [facility] [place], [place]   11 months ago Type 2 diabetes mellitus without complication, without long-term current use of insulin ([place])   [facility] Kathrine Haddock, NP

## 2018-12-08 ENCOUNTER — Other Ambulatory Visit: Payer: Self-pay | Admitting: Unknown Physician Specialty

## 2018-12-08 NOTE — Telephone Encounter (Signed)
Left message for pt to return call to office to schedule physical appt. Courtesy refill given.

## 2019-02-03 ENCOUNTER — Other Ambulatory Visit: Payer: Self-pay | Admitting: Unknown Physician Specialty

## 2019-02-03 NOTE — Telephone Encounter (Signed)
Requested Prescriptions  Pending Prescriptions Disp Refills  . atorvastatin (LIPITOR) 20 MG tablet [Pharmacy Med Name: ATORVASTATIN 20 MG TABLET] 90 tablet 0    Sig: TAKE 1 TABLET BY MOUTH EVERY DAY     Cardiovascular:  Antilipid - Statins Passed - 02/03/2019  1:37 AM      Passed - Total Cholesterol in normal range and within 360 days    Cholesterol, Total  Date Value Ref Range Status  04/23/2018 128 100 - 199 mg/dL Final         Passed - LDL in normal range and within 360 days    LDL Calculated  Date Value Ref Range Status  04/23/2018 55 0 - 99 mg/dL Final         Passed - HDL in normal range and within 360 days    HDL  Date Value Ref Range Status  04/23/2018 44 >39 mg/dL Final         Passed - Triglycerides in normal range and within 360 days    Triglycerides  Date Value Ref Range Status  04/23/2018 145 0 - 149 mg/dL Final         Passed - Patient is not pregnant      Passed - Valid encounter within last 12 months    Recent Outpatient Visits          9 months ago Type 2 diabetes mellitus without complication, without long-term current use of insulin ([place])   [facility] [place], Malachy Mood, NP   10 months ago Type 2 diabetes mellitus without complication, without long-term current use of insulin ([place])   [facility], Jeannette How, MD   1 year ago Moderate persistent asthma, unspecified whether complicated   [place], [place], [place]   1 year ago Type 2 diabetes mellitus without complication, without long-term current use of insulin ([place])   [facility] Kathrine Haddock, NP

## 2019-03-02 ENCOUNTER — Other Ambulatory Visit: Payer: Self-pay | Admitting: Unknown Physician Specialty

## 2019-03-02 NOTE — Telephone Encounter (Signed)
Please advise 

## 2019-04-02 ENCOUNTER — Other Ambulatory Visit: Payer: Self-pay | Admitting: Nurse Practitioner

## 2019-04-02 NOTE — Telephone Encounter (Signed)
For further refills on this script will need appointment.

## 2019-04-10 ENCOUNTER — Encounter: Payer: Self-pay | Admitting: Family Medicine

## 2019-04-10 ENCOUNTER — Other Ambulatory Visit: Payer: Self-pay

## 2019-04-10 ENCOUNTER — Ambulatory Visit (INDEPENDENT_AMBULATORY_CARE_PROVIDER_SITE_OTHER): Payer: BLUE CROSS/BLUE SHIELD | Admitting: Family Medicine

## 2019-04-10 VITALS — Ht 69.0 in | Wt 232.0 lb

## 2019-04-10 DIAGNOSIS — M2550 Pain in unspecified joint: Secondary | ICD-10-CM | POA: Diagnosis not present

## 2019-04-10 MED ORDER — EPINEPHRINE 0.3 MG/0.3ML IJ SOAJ: 0.3000 mg | INTRAMUSCULAR | 2 refills | Status: DC | PRN

## 2019-04-10 MED ORDER — PROAIR HFA 108 (90 BASE) MCG/ACT IN AERS: 2.0000 | INHALATION_SPRAY | RESPIRATORY_TRACT | 1 refills | Status: DC | PRN

## 2019-04-10 MED ORDER — CYCLOBENZAPRINE HCL 10 MG PO TABS: 10.0000 mg | ORAL_TABLET | Freq: Three times a day (TID) | ORAL | 1 refills | Status: DC | PRN

## 2019-04-10 MED ORDER — GABAPENTIN 300 MG PO CAPS: 300.0000 mg | ORAL_CAPSULE | Freq: Three times a day (TID) | ORAL | 1 refills | Status: DC | PRN

## 2019-04-10 NOTE — Progress Notes (Signed)
Ht 5\' 9"  (1.753 m)   Wt 232 lb (105.2 kg)   BMI 34.26 kg/m    Subjective:    Patient ID: Meredith Butler, female    DOB: [birth year], 63 y.o.   MRN: [id number]  HPI: Meredith Butler is a 63 y.o. female  Chief Complaint  Patient presents with  . Hand Pain    x about two weeks  . Neck Pain  . Foot Pain    bilateral    . This visit was completed via WebEx due to the restrictions of the COVID-19 pandemic. All issues as above were discussed and addressed. Physical exam was done as above through visual confirmation on WebEx. If it was felt that the patient should be evaluated in the office, they were directed there. The patient verbally consented to this visit. . Location of the patient: home . Location of the provider: work . Those involved with this call:  . Provider: Merrie Roof, PA-C . CMA: Yvonna Alanis, [place] . Front Desk/Registration: Jill Side  . Time spent on call: 25 minutes with patient face to face via video conference. More than 50% of this time was spent in counseling and coordination of care. 10 minutes total spent in review of patient's record and preparation of their chart. I verified patient identity using two factors (patient name and date of birth). Patient consents verbally to being seen via telemedicine visit today.   Hands, ankles, feet b/l swelling and pain ongoing for years but much worse the last few weeks. Had a positive ANA a year or so ago and underwent workup with Rheumatology who diagnosed fibromyalgia and told her she did not have lupus. Tried cymbalta in the past with no relief. Has been on gabapentin years ago for something else, does not recall that she had all these issues at that time. Denies [place] medication or lifestyle changes that could explain the Win worsening pain sxs. No fevers, rashes, night sweats, weight loss, injuries.   Relevant past medical, surgical, family and social history reviewed and updated as indicated. Interim medical history since our  last visit reviewed. Allergies and medications reviewed and updated.  Review of Systems  Per HPI unless specifically indicated above     Objective:    Ht 5\' 9"  (1.753 m)   Wt 232 lb (105.2 kg)   BMI 34.26 kg/m   Wt Readings from Last 3 Encounters:  04/10/19 232 lb (105.2 kg)  07/03/18 223 lb (101.2 kg)  04/23/18 215 lb 12.8 oz (97.9 kg)    Physical Exam Vitals signs and nursing note reviewed.  Constitutional:      General: She is not in acute distress.    Appearance: Normal appearance.  HENT:     Head: Atraumatic.     Right Ear: External ear normal.     Left Ear: External ear normal.     Nose: Nose normal. No congestion.     Mouth/Throat:     Mouth: Mucous membranes are moist.     Pharynx: Oropharynx is clear. No posterior oropharyngeal erythema.  Eyes:     Extraocular Movements: Extraocular movements intact.     Conjunctiva/sclera: Conjunctivae normal.  Neck:     Musculoskeletal: Normal range of motion.  Cardiovascular:     Comments: Unable to assess via virtual visit Pulmonary:     Effort: Pulmonary effort is normal. No respiratory distress.  Musculoskeletal: Normal range of motion.  Skin:    General: Skin is dry.     Findings: No erythema.  Neurological:     Mental Status: She is alert and oriented to person, place, and time.  Psychiatric:        Mood and Affect: Mood normal.        Thought Content: Thought content normal.        Judgment: Judgment normal.     Results for orders placed or performed in visit on 04/23/18  Comprehensive metabolic panel  Result Value Ref Range   Glucose 122 (H) 65 - 99 mg/dL   BUN 11 8 - 27 mg/dL   Creatinine, Ser 0.74 0.57 - 1.00 mg/dL   GFR calc non Af Amer 88 >59 mL/min/1.73   GFR calc Af Amer 101 >59 mL/min/1.73   BUN/Creatinine Ratio 15 12 - 28   Sodium 140 134 - 144 mmol/L   Potassium 3.9 3.5 - 5.2 mmol/L   Chloride 101 96 - 106 mmol/L   CO2 27 20 - 29 mmol/L   Calcium 9.4 8.7 - 10.3 mg/dL   Total Protein 6.9  6.0 - 8.5 g/dL   Albumin 4.2 3.6 - 4.8 g/dL   Globulin, Total 2.7 1.5 - 4.5 g/dL   Albumin/Globulin Ratio 1.6 1.2 - 2.2   Bilirubin Total 0.6 0.0 - 1.2 mg/dL   Alkaline Phosphatase 82 39 - 117 IU/L   AST 22 0 - 40 IU/L   ALT 19 0 - 32 IU/L  Lipid Panel w/o Chol/HDL Ratio  Result Value Ref Range   Cholesterol, Total 128 100 - 199 mg/dL   Triglycerides 145 0 - 149 mg/dL   HDL 44 >39 mg/dL   VLDL Cholesterol Cal 29 5 - 40 mg/dL   LDL Calculated 55 0 - 99 mg/dL  TSH  Result Value Ref Range   TSH 2.330 0.450 - 4.500 uIU/mL  Bayer DCA Hb A1c Waived  Result Value Ref Range   HB A1C (BAYER DCA - WAIVED) 5.8 <7.0 %      Assessment & Plan:   Problem List Items Addressed This Visit      Other   Polyarthralgia - Primary    Will restart gabapentin and monitor closely for benefit. Flexeril sent for prn use. If not improving will run labs again to recheck autoimmune disease screening, tick titers etc.           Follow up plan: Return in about 4 weeks (around 05/08/2019) for CPE, pain f/u.

## 2019-04-16 NOTE — Assessment & Plan Note (Signed)
Will restart gabapentin and monitor closely for benefit. Flexeril sent for prn use. If not improving will run labs again to recheck autoimmune disease screening, tick titers etc.

## 2019-04-26 ENCOUNTER — Other Ambulatory Visit: Payer: Self-pay | Admitting: Nurse Practitioner

## 2019-04-26 NOTE — Telephone Encounter (Signed)
Requested Prescriptions  Pending Prescriptions Disp Refills  . meloxicam (MOBIC) 15 MG tablet [Pharmacy Med Name: MELOXICAM 15 MG TABLET] 30 tablet 0    Sig: TAKE 1 TABLET (15 MG TOTAL) BY MOUTH DAILY. FOR FURTHER REFILLS ON THIS NEED TO SEE PROVIDER.     Analgesics:  COX2 Inhibitors Failed - 04/26/2019  8:59 AM      Failed - HGB in normal range and within 360 days    Hemoglobin  Date Value Ref Range Status  12/24/2017 13.6 11.1 - 15.9 g/dL Final         Failed - Cr in normal range and within 360 days    Creatinine, Ser  Date Value Ref Range Status  04/23/2018 0.74 0.57 - 1.00 mg/dL Final         Passed - Patient is not pregnant      Passed - Valid encounter within last 12 months    Recent Outpatient Visits          2 weeks ago [place], [place], [place]   1 year ago Type 2 diabetes mellitus without complication, without long-term current use of insulin ([place])   [facility] Kathrine Haddock, NP   1 year ago Type 2 diabetes mellitus without complication, without long-term current use of insulin ([place])   [place] Crissman, Jeannette How, MD   1 year ago Moderate persistent asthma, unspecified whether complicated   [place], [place], [place]   1 year ago Type 2 diabetes mellitus without complication, without long-term current use of insulin ([place])   [place], NP      Future Appointments            In 1 month Orene Desanctis, Lilia Argue, [place], [facility]

## 2019-05-06 ENCOUNTER — Other Ambulatory Visit: Payer: Self-pay | Admitting: Unknown Physician Specialty

## 2019-05-06 NOTE — Telephone Encounter (Signed)
Not on pt's current med list, how would you like to proceed

## 2019-05-09 ENCOUNTER — Telehealth: Payer: Self-pay | Admitting: Unknown Physician Specialty

## 2019-05-11 ENCOUNTER — Other Ambulatory Visit: Payer: Self-pay | Admitting: Nurse Practitioner

## 2019-05-11 MED ORDER — GABAPENTIN 300 MG PO CAPS: 300.0000 mg | ORAL_CAPSULE | Freq: Three times a day (TID) | ORAL | 1 refills | Status: DC | PRN

## 2019-05-11 MED ORDER — MELOXICAM 15 MG PO TABS: 15.0000 mg | ORAL_TABLET | Freq: Every day | ORAL | 0 refills | Status: DC

## 2019-05-11 NOTE — Progress Notes (Signed)
Gabapentin and Meloxicam refills sent, for further refills will need to see provider.

## 2019-05-11 NOTE — Telephone Encounter (Signed)
Pt states she is no longer taking this med DULoxetine (CYMBALTA) 60 MG capsule  [facility] and let them know.  She states she is taking gabapentin (NEURONTIN) 300 MG capsule meloxicam (MOBIC) 15 MG tablet In its place.  You can cancel Rx  [id number] [place], [place] [phone] (Phone) [phone] (Fax)

## 2019-05-11 NOTE — Telephone Encounter (Signed)
Do not see on current med list. Please advise

## 2019-05-16 ENCOUNTER — Other Ambulatory Visit: Payer: Self-pay | Admitting: Unknown Physician Specialty

## 2019-05-16 NOTE — Telephone Encounter (Signed)
Requested medication (s) are due for refill today: no  Requested medication (s) are on the active medication list: no  Last refill:  06/03/18  Future visit scheduled: yes  Notes to clinic:  End date for prescription 04/10/19    Requested Prescriptions  Pending Prescriptions Disp Refills   metFORMIN (GLUCOPHAGE) 500 MG tablet [Pharmacy Med Name: METFORMIN HCL 500 MG TABLET] 90 tablet 2    Sig: TAKE 1 TABLET BY [place]     Endocrinology:  Diabetes - Biguanides Failed - 05/16/2019 10:50 AM      Failed - Cr in normal range and within 360 days    Creatinine, Ser  Date Value Ref Range Status  04/23/2018 0.74 0.57 - 1.00 mg/dL Final         Failed - HBA1C is between 0 and 7.9 and within 180 days    HB A1C (BAYER DCA - WAIVED)  Date Value Ref Range Status  04/23/2018 5.8 <7.0 % Final    Comment:                                          Diabetic Adult            <7.0                                       Healthy Adult        4.3 - 5.7                                                           (DCCT/NGSP) American Diabetes Association's Summary of Glycemic Recommendations for Adults with Diabetes: Hemoglobin A1c <7.0%. More stringent glycemic goals (A1c <6.0%) may further reduce complications at the cost of increased risk of hypoglycemia.          Failed - eGFR in normal range and within 360 days    GFR calc Af Amer  Date Value Ref Range Status  04/23/2018 101 >59 mL/min/1.73 Final   GFR calc non Af Amer  Date Value Ref Range Status  04/23/2018 88 >59 mL/min/1.73 Final         Passed - Valid encounter within last 6 months    Recent Outpatient Visits          1 month ago [place], Lilia Argue, [place]   1 year ago Type 2 diabetes mellitus without complication, without long-term current use of insulin ([place])   [facility] Kathrine Haddock, NP   1 year ago Type 2 diabetes mellitus without complication,  without long-term current use of insulin ([place])   [place] Crissman, Jeannette How, MD   1 year ago Moderate persistent asthma, unspecified whether complicated   [facility], [place], [place]   1 year ago Type 2 diabetes mellitus without complication, without long-term current use of insulin ([place])   [facility] Kathrine Haddock, NP      Future Appointments            In 2 weeks Orene Desanctis, Lilia Argue, PA-C [facility], [facility]

## 2019-05-18 NOTE — Telephone Encounter (Signed)
LVM for pt to call back.

## 2019-05-18 NOTE — Telephone Encounter (Signed)
Is patient taking this medication?  Refill request sent, but it appears to have been discontinued 04/10/2019.  Also patient needs a visit for diabetes labs, she has not had these in one year and we need to check these at least every 6 months.  Thanks.

## 2019-05-19 NOTE — Telephone Encounter (Signed)
Spoke with pt and she is not out of medication and stated that she is going out of town and won't be back until the 13th. When asked if she would run out of meds by then she said no. Forwarding as FYI.

## 2019-06-01 ENCOUNTER — Other Ambulatory Visit: Payer: Self-pay | Admitting: Unknown Physician Specialty

## 2019-06-04 ENCOUNTER — Ambulatory Visit: Payer: Self-pay | Admitting: Family Medicine

## 2019-06-04 ENCOUNTER — Telehealth: Payer: Self-pay | Admitting: Family Medicine

## 2019-06-04 MED ORDER — MELOXICAM 15 MG PO TABS: 15.0000 mg | ORAL_TABLET | Freq: Every day | ORAL | 0 refills | Status: DC

## 2019-06-04 NOTE — Telephone Encounter (Signed)
Pt presented in office however we had to reschedule due to her being around grandson who is currently awaiting covid-19 results. She is wanting to have a refill on her meloxicam. Please advise.

## 2019-06-04 NOTE — Addendum Note (Signed)
Addended by: Merrie Roof E on: 06/04/2019 11:36 AM   Modules accepted: Orders

## 2019-06-04 NOTE — Telephone Encounter (Signed)
Rx refilled.

## 2019-07-15 ENCOUNTER — Ambulatory Visit: Payer: BC Managed Care – PPO | Admitting: Family Medicine

## 2019-07-15 ENCOUNTER — Encounter: Payer: Self-pay | Admitting: Family Medicine

## 2019-07-15 ENCOUNTER — Other Ambulatory Visit (HOSPITAL_COMMUNITY)
Admission: RE | Admit: 2019-07-15 | Discharge: 2019-07-15 | Disposition: A | Discharge status: Home / Self Care | Payer: BC Managed Care – PPO | Source: Ambulatory Visit | Attending: Family Medicine | Admitting: Family Medicine

## 2019-07-15 ENCOUNTER — Other Ambulatory Visit: Payer: Self-pay

## 2019-07-15 VITALS — BP 140/100 | HR 84 | Temp 98.5°F | Ht 67.5 in | Wt 238.0 lb

## 2019-07-15 DIAGNOSIS — E7800 Pure hypercholesterolemia, unspecified: Secondary | ICD-10-CM

## 2019-07-15 DIAGNOSIS — R7989 Other specified abnormal findings of blood chemistry: Secondary | ICD-10-CM

## 2019-07-15 DIAGNOSIS — Z124 Encounter for screening for malignant neoplasm of cervix: Secondary | ICD-10-CM | POA: Insufficient documentation

## 2019-07-15 DIAGNOSIS — J4540 Moderate persistent asthma, uncomplicated: Secondary | ICD-10-CM

## 2019-07-15 DIAGNOSIS — Z0000 Encounter for general adult medical examination without abnormal findings: Secondary | ICD-10-CM

## 2019-07-15 DIAGNOSIS — E119 Type 2 diabetes mellitus without complications: Secondary | ICD-10-CM

## 2019-07-15 DIAGNOSIS — I700 Atherosclerosis of aorta: Secondary | ICD-10-CM

## 2019-07-15 DIAGNOSIS — I10 Essential (primary) hypertension: Secondary | ICD-10-CM | POA: Diagnosis not present

## 2019-07-15 DIAGNOSIS — Z1239 Encounter for other screening for malignant neoplasm of breast: Secondary | ICD-10-CM | POA: Diagnosis not present

## 2019-07-15 DIAGNOSIS — M797 Fibromyalgia: Secondary | ICD-10-CM

## 2019-07-15 DIAGNOSIS — M2550 Pain in unspecified joint: Secondary | ICD-10-CM

## 2019-07-15 DIAGNOSIS — M25531 Pain in right wrist: Secondary | ICD-10-CM

## 2019-07-15 MED ORDER — LISINOPRIL-HYDROCHLOROTHIAZIDE 20-25 MG PO TABS: 1.0000 | ORAL_TABLET | Freq: Every day | ORAL | 0 refills | Status: DC

## 2019-07-15 NOTE — Progress Notes (Signed)
BP (!) 140/100   Pulse 84   Temp 98.5 F (36.9 C) (Oral)   Ht 5' 7.5" (1.715 m)   Wt 238 lb (108 kg)   SpO2 96%   BMI 36.73 kg/m    Subjective:    Patient ID: Meredith Butler, female    DOB: 03-15-1956, 63 y.o.   MRN: [id number]  HPI: Meredith Butler is a 63 y.o. female presenting on 07/15/2019 for comprehensive medical examination. Current medical complaints include:see below  BSs 130s-150s when checked, but she states this is typically after eating a meal. Does not check fasting sugars. No known low blood sugar spells. Previously on metformin, was able to come off and has been diet controlled recently. Last A1C 5.8 04/2018.   HTN - Does not have means to check home BPs. Takes her medicine faithfully without side effects. Denies CP, SOB, HAs, dizziness.   HLD - did not tolerate statins well, not currently on anything for cholesterol management. Not watching diet closely or exercising regularly due to her joint pain issues.   Still having back pain and wrist pain, but notes currently it's worst in the right wrist where a knot has come up. The joint is swollen some but no color change or heat that she's noticed. Was diagnosed with Fibromyalgia through Rheumatology last year after workup for a positive ANA per patient. Does have fibromyalgia dx'd by Rheumatology. Started the gabapentin a month or so ago but stopped it because her daughter told her that could worsen mood issues so she declines taking this or anything similar. Denies fevers, chills, rashes, recent tick bites, travel.   Hx of abnormal TSH, not currently on medication.   Asthma under good control on anoro and albuterol prn.   She currently lives with: Menopausal Symptoms: no  Depression Screen done today and results listed below:  Depression screen [facility] 2/9 07/15/2019 04/10/2019 04/23/2018 01/30/2018 01/14/2018  Decreased Interest 0 1 1 0 1  Down, Depressed, Hopeless 0 0 1 0 0  PHQ - 2 Score 0 1 2 0 1  Altered sleeping 0 _0 -  Tired,  decreased energy 0 0 2 1 -  Change in appetite _1 -  Feeling bad or failure about yourself  0 0 1 0 -  Trouble concentrating 0 0 1 0 -  Moving slowly or fidgety/restless 0 0 0 0 -  Suicidal thoughts 0 0 0 0 -  PHQ-9 Score _2 -    The patient does not have a history of falls. I did complete a risk assessment for falls. A plan of care for falls was documented.   Past Medical History:  Past Medical History:  Diagnosis Date  . Asthma   . Diabetes mellitus without complication ([place])   . Fibromyalgia   . Hypertension   . Lupus ([place])   . Peritonitis [facility])     Surgical History:  Past Surgical History:  Procedure Laterality Date  . ABDOMINAL HYSTERECTOMY     complete  . ABDOMINAL SURGERY    . APPENDECTOMY    . CHOLECYSTECTOMY    . COLON SURGERY    . JOINT REPLACEMENT Bilateral    knee  . SMALL INTESTINE SURGERY    . SPLENECTOMY, TOTAL    . TONSILLECTOMY      Medications:  Current Outpatient Medications on File Prior to Visit  Medication Sig  . cyclobenzaprine (FLEXERIL) 10 MG tablet Take 1 tablet (10 mg total) by mouth 3 (  three) times daily as needed for muscle spasms.  Marland Kitchen EPINEPHrine (EPIPEN 2-PAK) 0.3 mg/0.3 mL IJ SOAJ injection Inject 0.3 mLs (0.3 mg total) into the muscle as needed for anaphylaxis.  Marland Kitchen glucose blood test strip Use as instructed  . Magnesium 250 MG TABS Take 250 mg by mouth daily as needed.  Marland Kitchen PROAIR HFA 108 (90 Base) MCG/ACT inhaler Inhale 2 puffs into the lungs every 4 (four) hours as needed for wheezing or shortness of breath.  . umeclidinium-vilanterol (ANORO ELLIPTA) 62.5-25 MCG/INH AEPB Inhale 1 puff into the lungs daily.   No current facility-administered medications on file prior to visit.     Allergies:  Allergies  Allergen Reactions  . Claforan [Cefotaxime] Anaphylaxis  . Ibuprofen Anaphylaxis  . Omnicef [Cefdinir] Anaphylaxis  . Penicillins Anaphylaxis    Has patient had a PCN reaction causing immediate rash,  facial/tongue/throat swelling, SOB or lightheadedness with hypotension: Yes Has patient had a PCN reaction causing severe rash involving mucus membranes or skin necrosis: No Has patient had a PCN reaction that required hospitalization: Yes Has patient had a PCN reaction occurring within the last 10 years: Yes If all of the above answers are "NO", then may proceed with Cephalosporin use.   . Sulfa Antibiotics Anaphylaxis  . Tetanus Toxoids Anaphylaxis  . Voltaren [Diclofenac Sodium] Anaphylaxis  . Codeine     Makes her crazy   . Diovan [Valsartan] Swelling  . Rocephin [Ceftriaxone]     Eyes turn blood red   . Statins     myalgias    Social History:  Social History   Socioeconomic History  . Marital status: Married    Spouse name: Not on file  . Number of children: Not on file  . Years of education: Not on file  . Highest education level: Not on file  Occupational History  . Not on file  Social Needs  . Financial resource strain: Not on file  . Food insecurity    Worry: Not on file    Inability: Not on file  . Transportation needs    Medical: Not on file    Non-medical: Not on file  Tobacco Use  . Smoking status: Former Smoker    Packs/day: 2.00    Years: 30.00    Pack years: 60.00    Types: Cigarettes    Quit date: 11/20/2009    Years since quitting: 9.6  . Smokeless tobacco: Never Used  Substance and Sexual Activity  . Alcohol use: No    Frequency: Never  . Drug use: No  . Sexual activity: Yes  Lifestyle  . Physical activity    Days per week: Not on file    Minutes per session: Not on file  . Stress: Not on file  Relationships  . Social Herbalist on phone: Not on file    Gets together: Not on file    Attends religious service: Not on file    Active member of club or organization: Not on file    Attends meetings of clubs or organizations: Not on file    Relationship status: Not on file  . Intimate partner violence    Fear of current or ex  partner: Not on file    Emotionally abused: Not on file    Physically abused: Not on file    Forced sexual activity: Not on file  Other Topics Concern  . Not on file  Social History Narrative  . Not on file   Social  History   Tobacco Use  Smoking Status Former Smoker  . Packs/day: 2.00  . Years: 30.00  . Pack years: 60.00  . Types: Cigarettes  . Quit date: 11/20/2009  . Years since quitting: 9.6  Smokeless Tobacco Never Used   Social History   Substance and Sexual Activity  Alcohol Use No  . Frequency: Never    Family History:  Family History  Problem Relation Age of Onset  . Heart attack Mother   . Cancer Mother   . Diabetes Mother   . Heart attack Father   . Diabetes Sister   . Diabetes Daughter   . Cancer Maternal Grandmother        ovarian  . Cancer Paternal Grandfather        colon  . Cancer Sister        lung  . Diabetes Daughter   . Diabetes Maternal Aunt   . Diabetes Maternal Uncle     Past medical history, surgical history, medications, allergies, family history and social history reviewed with patient today and changes made to appropriate areas of the chart.   Review of Systems - General ROS: negative Psychological ROS: negative Ophthalmic ROS: negative ENT ROS: negative Allergy and Immunology ROS: negative Hematological and Lymphatic ROS: negative Endocrine ROS: negative Breast ROS: negative for breast lumps Respiratory ROS: no cough, shortness of breath, or wheezing Cardiovascular ROS: no chest pain or dyspnea on exertion Gastrointestinal ROS: no abdominal pain, change in bowel habits, or black or bloody stools Genito-Urinary ROS: no dysuria, trouble voiding, or hematuria Musculoskeletal ROS: positive for - joint pain and muscle pain Neurological ROS: no TIA or stroke symptoms Dermatological ROS: negative All other ROS negative except what is listed above and in the HPI.      Objective:    BP (!) 140/100   Pulse 84   Temp 98.5 F (36.9  C) (Oral)   Ht 5' 7.5" (1.715 m)   Wt 238 lb (108 kg)   SpO2 96%   BMI 36.73 kg/m   Wt Readings from Last 3 Encounters:  07/15/19 238 lb (108 kg)  04/10/19 232 lb (105.2 kg)  07/03/18 223 lb (101.2 kg)    Physical Exam Vitals signs and nursing note reviewed.  Constitutional:      General: She is not in acute distress.    Appearance: She is well-developed.  HENT:     Head: Atraumatic.     Right Ear: External ear normal.     Left Ear: External ear normal.     Nose: Nose normal.     Mouth/Throat:     Pharynx: No oropharyngeal exudate.  Eyes:     General: No scleral icterus.    Conjunctiva/sclera: Conjunctivae normal.     Pupils: Pupils are equal, round, and reactive to light.  Neck:     Musculoskeletal: Normal range of motion and neck supple.     Thyroid: No thyromegaly.  Cardiovascular:     Rate and Rhythm: Normal rate and regular rhythm.     Heart sounds: Normal heart sounds.  Pulmonary:     Effort: Pulmonary effort is normal. No respiratory distress.     Breath sounds: Normal breath sounds.  Chest:     Breasts:        Right: No mass, skin change or tenderness.        Left: No mass, skin change or tenderness.  Abdominal:     General: Bowel sounds are normal.     Palpations: Abdomen  is soft. There is no mass.     Tenderness: There is no abdominal tenderness.  Genitourinary:    General: Normal vulva.     Vagina: No vaginal discharge.  Musculoskeletal: Normal range of motion.        General: Swelling (mild swelling right wrist, warm to touch) and tenderness (right wrist) present.  Lymphadenopathy:     Cervical: No cervical adenopathy.  Skin:    General: Skin is warm and dry.     Findings: No rash.  Neurological:     Mental Status: She is alert and oriented to person, place, and time.     Cranial Nerves: No cranial nerve deficit.  Psychiatric:        Behavior: Behavior normal.     Results for orders placed or performed in visit on 07/15/19  Microscopic  Examination   URINE  Result Value Ref Range   WBC, UA 6-10 (A) 0 - 5 /hpf   RBC 0-2 0 - 2 /hpf   Epithelial Cells (non renal) 0-10 0 - 10 /hpf   Crystals Present N/A   Crystal Type Uric Acid N/A   Bacteria, UA Many (A) None seen/Few  Urine Culture, Reflex   URINE  Result Value Ref Range   Urine Culture, Routine Final report (A)    Organism ID, Bacteria Escherichia coli (A)    Antimicrobial Susceptibility Comment   CBC with Differential/Platelet  Result Value Ref Range   WBC 6.1 3.4 - 10.8 x10E3/uL   RBC 4.89 3.77 - 5.28 x10E6/uL   Hemoglobin 15.5 11.1 - 15.9 g/dL   Hematocrit 44.1 34.0 - 46.6 %   MCV 90 79 - 97 fL   MCH 31.7 26.6 - 33.0 pg   MCHC 35.1 31.5 - 35.7 g/dL   RDW 12.4 11.7 - 15.4 %   Platelets 310 150 - 450 x10E3/uL   Neutrophils 66 Not Estab. %   Lymphs 20 Not Estab. %   Monocytes 8 Not Estab. %   Eos 5 Not Estab. %   Basos 1 Not Estab. %   Neutrophils Absolute 4.0 1.4 - 7.0 x10E3/uL   Lymphocytes Absolute 1.2 0.7 - 3.1 x10E3/uL   Monocytes Absolute 0.5 0.1 - 0.9 x10E3/uL   EOS (ABSOLUTE) 0.3 0.0 - 0.4 x10E3/uL   Basophils Absolute 0.1 0.0 - 0.2 x10E3/uL   Immature Granulocytes 0 Not Estab. %   Immature Grans (Abs) 0.0 0.0 - 0.1 x10E3/uL  Comprehensive metabolic panel  Result Value Ref Range   Glucose 115 (H) 65 - 99 mg/dL   BUN 13 8 - 27 mg/dL   Creatinine, Ser 0.87 0.57 - 1.00 mg/dL   GFR calc non Af Amer 71 >59 mL/min/1.73   GFR calc Af Amer 82 >59 mL/min/1.73   BUN/Creatinine Ratio 15 12 - 28   Sodium 142 134 - 144 mmol/L   Potassium 4.9 3.5 - 5.2 mmol/L   Chloride 100 96 - 106 mmol/L   CO2 22 20 - 29 mmol/L   Calcium 9.9 8.7 - 10.3 mg/dL   Total Protein 7.6 6.0 - 8.5 g/dL   Albumin 4.9 (H) 3.8 - 4.8 g/dL   Globulin, Total 2.7 1.5 - 4.5 g/dL   Albumin/Globulin Ratio 1.8 1.2 - 2.2   Bilirubin Total 0.6 0.0 - 1.2 mg/dL   Alkaline Phosphatase 88 39 - 117 IU/L   AST 26 0 - 40 IU/L   ALT 30 0 - 32 IU/L  Lipid Panel w/o Chol/HDL Ratio  Result  Value Ref Range  Cholesterol, Total 206 (H) 100 - 199 mg/dL   Triglycerides 152 (H) 0 - 149 mg/dL   HDL 50 >39 mg/dL   VLDL Cholesterol Cal 30 5 - 40 mg/dL   LDL Calculated 126 (H) 0 - 99 mg/dL  TSH  Result Value Ref Range   TSH 2.320 0.450 - 4.500 uIU/mL  UA/M w/rflx Culture, Routine   Specimen: Urine   URINE  Result Value Ref Range   Specific Gravity, UA 1.020 1.005 - 1.030   pH, UA 5.5 5.0 - 7.5   Color, UA Yellow Yellow   Appearance Ur Clear Clear   Leukocytes,UA Negative Negative   Protein,UA Negative Negative/Trace   Glucose, UA Negative Negative   Ketones, UA Negative Negative   RBC, UA Trace (A) Negative   Bilirubin, UA Negative Negative   Urobilinogen, Ur 0.2 0.2 - 1.0 mg/dL   Nitrite, UA Negative Negative   Microscopic Examination See below:    Urinalysis Reflex Comment   HgB A1c  Result Value Ref Range   Hgb A1c MFr Bld 7.0 (H) 4.8 - 5.6 %   Est. average glucose Bld gHb Est-mCnc 154 mg/dL  Sed Rate (ESR)  Result Value Ref Range   Sed Rate 10 0 - 40 mm/hr  Rheumatoid Factor  Result Value Ref Range   Rhuematoid fact SerPl-aCnc <10.0 0.0 - 13.9 IU/mL  Lyme Ab/Western Blot Reflex  Result Value Ref Range   Lyme IgG/IgM Ab <0.91 0.00 - 0.90 ISR   LYME DISEASE AB, QUANT, IGM <0.80 0.00 - 0.79 index  Rocky mtn spotted fvr abs pnl(IgG+IgM)  Result Value Ref Range   RMSF IgG Negative Negative   RMSF IgM 0.37 0.00 - 0.89 index  Uric acid  Result Value Ref Range   Uric Acid 8.4 (H) 2.5 - 7.1 mg/dL  Cytology - PAP  Result Value Ref Range   Adequacy Satisfactory for evaluation.    Diagnosis      NEGATIVE FOR INTRAEPITHELIAL LESIONS OR MALIGNANCY.   HPV NOT Detected    Material Submitted Vaginal Pap [ThinPrep Imaged]       Assessment & Plan:   Problem List Items Addressed This Visit      Cardiovascular and Mediastinum   Essential hypertension, benign - Primary    Not at goal. Script given so she can check and log readings. Increase lisinopril HCTZ dose from  12.5 mg HCTZ to 25 mg HCTZ and recheck in 1 month      Relevant Medications   lisinopril-hydrochlorothiazide (ZESTORETIC) 20-25 MG tablet   Other Relevant Orders   CBC with Differential/Platelet (Completed)   Comprehensive metabolic panel (Completed)   UA/M w/rflx Culture, Routine (Completed)   Aortic atherosclerosis ([place])    Work on [employer] as below      Relevant Medications   lisinopril-hydrochlorothiazide (ZESTORETIC) 20-25 MG tablet   Other Relevant Orders   Lipid Panel w/o Chol/HDL Ratio (Completed)     Respiratory   Asthma    Stable and under good control, continue inhaler regimen        Endocrine   Type 2 diabetes mellitus without complication, without long-term current use of insulin ([facility]) (Chronic)    Diet controlled, previously under good control but recent sugars elevated. Will recheck A1C and restart medicine if not at goal. Lifestyle modifications reviewed      Relevant Medications   lisinopril-hydrochlorothiazide (ZESTORETIC) 20-25 MG tablet   Other Relevant Orders   HgB A1c (Completed)     Other   Hypercholesteremia  Off statins due to side effects, discussed rechecking levels and adding zetia if needed and continuing to work on lifestyle modifications      Relevant Medications   lisinopril-hydrochlorothiazide (ZESTORETIC) 20-25 MG tablet   Other Relevant Orders   Lipid Panel w/o Chol/HDL Ratio (Completed)   Fibromyalgia    Stopped gabapentin due to fear of mood issues, and not ready to try anything [place] at this time. Difficult to discern whether [place] joint pain issues are related so will run further tests to r/o other causes      Polyarthralgia    Per Rheumatology negative for lupus. Will check tick labs, uric acid, vitamin levels, etc. Does have known fibromyalgia not currently on medication. Will await lab results and discuss further management based on these. OTC pain relievers, topicals for now      Elevated TSH    Recheck TSH, adjust as  needed      Relevant Orders   TSH (Completed)    Other Visit Diagnoses    Encounter for annual physical exam       Arthralgia of right wrist       Relevant Orders   Sed Rate (ESR) (Completed)   Rheumatoid Factor (Completed)   Lyme Ab/Western Blot Reflex (Completed)   Rocky mtn spotted fvr abs pnl(IgG+IgM) (Completed)   Uric acid   Breast cancer screening       Relevant Orders   MM DIGITAL SCREENING BILATERAL   Screening for cervical cancer       Relevant Orders   Cytology - PAP (Completed)       Follow up plan: Return in about 4 weeks (around 08/12/2019) for BP.   LABORATORY TESTING:  - Pap smear: pap done  IMMUNIZATIONS:   - Tdap: Tetanus vaccination status reviewed: refused. - Influenza: Refused - Pneumovax: Refused - Prevnar: Refused - HPV: Not applicable - Zostavax vaccine: Refused  SCREENING: -Mammogram: Ordered today  - Colonoscopy: Refused   PATIENT COUNSELING:   Advised to take 1 mg of folate supplement per day if capable of pregnancy.   Sexuality: Discussed sexually transmitted diseases, partner selection, use of condoms, avoidance of unintended pregnancy  and contraceptive alternatives.   Advised to avoid cigarette smoking.  I discussed with the patient that most people either abstain from alcohol or drink within safe limits (<=14/week and <=4 drinks/occasion for males, <=7/weeks and <= 3 drinks/occasion for females) and that the risk for alcohol disorders and other health effects rises proportionally with the number of drinks per week and how often a drinker exceeds daily limits.  Discussed cessation/primary prevention of drug use and availability of treatment for abuse.   Diet: Encouraged to adjust caloric intake to maintain  or achieve ideal body weight, to reduce intake of dietary saturated fat and total fat, to limit sodium intake by avoiding high sodium foods and not adding table salt, and to maintain adequate dietary potassium and calcium  preferably from fresh fruits, vegetables, and low-fat dairy products.    stressed the importance of regular exercise  Injury prevention: Discussed safety belts, safety helmets, smoke detector, smoking near bedding or upholstery.   Dental health: Discussed importance of regular tooth brushing, flossing, and dental visits.    NEXT PREVENTATIVE PHYSICAL DUE IN 1 YEAR. Return in about 4 weeks (around 08/12/2019) for BP.

## 2019-07-16 ENCOUNTER — Telehealth: Payer: Self-pay | Admitting: Family Medicine

## 2019-07-16 ENCOUNTER — Other Ambulatory Visit: Payer: Self-pay | Admitting: Family Medicine

## 2019-07-16 MED ORDER — PREDNISONE 20 MG PO TABS: 40.0000 mg | ORAL_TABLET | Freq: Every day | ORAL | 0 refills | Status: DC

## 2019-07-16 MED ORDER — EZETIMIBE 10 MG PO TABS: 10.0000 mg | ORAL_TABLET | Freq: Every day | ORAL | 0 refills | Status: DC

## 2019-07-16 MED ORDER — METFORMIN HCL 500 MG PO TABS: 500.0000 mg | ORAL_TABLET | Freq: Two times a day (BID) | ORAL | 2 refills | Status: DC

## 2019-07-16 NOTE — Telephone Encounter (Signed)
Patient is calling back for her lab results. Cb- [phone]

## 2019-07-17 LAB — COMPREHENSIVE METABOLIC PANEL
ALT: 30 IU/L (ref 0–32)
AST: 26 IU/L (ref 0–40)
Albumin/Globulin Ratio: 1.8 (ref 1.2–2.2)
Albumin: 4.9 g/dL — ABNORMAL HIGH (ref 3.8–4.8)
Alkaline Phosphatase: 88 IU/L (ref 39–117)
BUN/Creatinine Ratio: 15 (ref 12–28)
BUN: 13 mg/dL (ref 8–27)
Bilirubin Total: 0.6 mg/dL (ref 0.0–1.2)
CO2: 22 mmol/L (ref 20–29)
Calcium: 9.9 mg/dL (ref 8.7–10.3)
Chloride: 100 mmol/L (ref 96–106)
Creatinine, Ser: 0.87 mg/dL (ref 0.57–1.00)
GFR calc Af Amer: 82 mL/min/{1.73_m2} (ref >59)
GFR calc non Af Amer: 71 mL/min/{1.73_m2} (ref >59)
Globulin, Total: 2.7 g/dL (ref 1.5–4.5)
Glucose: 115 mg/dL — ABNORMAL HIGH (ref 65–99)
Potassium: 4.9 mmol/L (ref 3.5–5.2)
Sodium: 142 mmol/L (ref 134–144)
Total Protein: 7.6 g/dL (ref 6.0–8.5)

## 2019-07-17 LAB — CBC WITH DIFFERENTIAL/PLATELET
Basophils Absolute: 0.1 10*3/uL (ref 0.0–0.2)
Basos: 1 %
EOS (ABSOLUTE): 0.3 10*3/uL (ref 0.0–0.4)
Eos: 5 %
Hematocrit: 44.1 % (ref 34.0–46.6)
Hemoglobin: 15.5 g/dL (ref 11.1–15.9)
Immature Grans (Abs): 0 10*3/uL (ref 0.0–0.1)
Immature Granulocytes: 0 %
Lymphocytes Absolute: 1.2 10*3/uL (ref 0.7–3.1)
Lymphs: 20 %
MCH: 31.7 pg (ref 26.6–33.0)
MCHC: 35.1 g/dL (ref 31.5–35.7)
MCV: 90 fL (ref 79–97)
Monocytes Absolute: 0.5 10*3/uL (ref 0.1–0.9)
Monocytes: 8 %
Neutrophils Absolute: 4 10*3/uL (ref 1.4–7.0)
Neutrophils: 66 %
Platelets: 310 10*3/uL (ref 150–450)
RBC: 4.89 x10E6/uL (ref 3.77–5.28)
RDW: 12.4 % (ref 11.7–15.4)
WBC: 6.1 10*3/uL (ref 3.4–10.8)

## 2019-07-17 LAB — LIPID PANEL W/O CHOL/HDL RATIO
Cholesterol, Total: 206 mg/dL — ABNORMAL HIGH (ref 100–199)
HDL: 50 mg/dL (ref >39)
LDL Calculated: 126 mg/dL — ABNORMAL HIGH (ref 0–99)
Triglycerides: 152 mg/dL — ABNORMAL HIGH (ref 0–149)
VLDL Cholesterol Cal: 30 mg/dL (ref 5–40)

## 2019-07-17 LAB — LYME AB/WESTERN BLOT REFLEX
LYME DISEASE AB, QUANT, IGM: <0.8 index (ref 0.00–0.79)
Lyme IgG/IgM Ab: <0.91 {ISR} (ref 0.00–0.90)

## 2019-07-17 LAB — HEMOGLOBIN A1C
Est. average glucose Bld gHb Est-mCnc: 154 mg/dL
Hgb A1c MFr Bld: 7 % — ABNORMAL HIGH (ref 4.8–5.6)

## 2019-07-17 LAB — ROCKY MTN SPOTTED FVR ABS PNL(IGG+IGM)
RMSF IgG: NEGATIVE
RMSF IgM: 0.37 index (ref 0.00–0.89)

## 2019-07-17 LAB — SEDIMENTATION RATE: Sed Rate: 10 mm/hr (ref 0–40)

## 2019-07-17 LAB — URIC ACID: Uric Acid: 8.4 mg/dL — ABNORMAL HIGH (ref 2.5–7.1)

## 2019-07-17 LAB — RHEUMATOID FACTOR: Rheumatoid fact SerPl-aCnc: <10 IU/mL (ref 0.0–13.9)

## 2019-07-17 LAB — TSH: TSH: 2.32 u[IU]/mL (ref 0.450–4.500)

## 2019-07-17 NOTE — Telephone Encounter (Signed)
See result note. Lab results given to patient.

## 2019-07-18 LAB — UA/M W/RFLX CULTURE, ROUTINE
Bilirubin, UA: NEGATIVE
Glucose, UA: NEGATIVE
Ketones, UA: NEGATIVE
Leukocytes,UA: NEGATIVE
Nitrite, UA: NEGATIVE
Protein,UA: NEGATIVE
Specific Gravity, UA: 1.02 (ref 1.005–1.030)
Urobilinogen, Ur: 0.2 mg/dL (ref 0.2–1.0)
pH, UA: 5.5 (ref 5.0–7.5)

## 2019-07-18 LAB — MICROSCOPIC EXAMINATION

## 2019-07-18 LAB — URINE CULTURE, REFLEX

## 2019-07-20 ENCOUNTER — Other Ambulatory Visit: Payer: Self-pay | Admitting: Family Medicine

## 2019-07-20 MED ORDER — NITROFURANTOIN MONOHYD MACRO 100 MG PO CAPS: 100.0000 mg | ORAL_CAPSULE | Freq: Two times a day (BID) | ORAL | 0 refills | Status: DC

## 2019-07-21 LAB — CYTOLOGY - PAP
Diagnosis: NEGATIVE
HPV: NOT DETECTED

## 2019-07-27 NOTE — Assessment & Plan Note (Signed)
Recheck TSH, adjust as needed 

## 2019-07-27 NOTE — Assessment & Plan Note (Signed)
Diet controlled, previously under good control but recent sugars elevated. Will recheck A1C and restart medicine if not at goal. Lifestyle modifications reviewed

## 2019-07-27 NOTE — Assessment & Plan Note (Signed)
Work on [employer] as below

## 2019-07-27 NOTE — Assessment & Plan Note (Signed)
Off statins due to side effects, discussed rechecking levels and adding zetia if needed and continuing to work on lifestyle modifications

## 2019-07-27 NOTE — Assessment & Plan Note (Signed)
Stable and under good control, continue inhaler regimen

## 2019-07-27 NOTE — Assessment & Plan Note (Signed)
Per Rheumatology negative for lupus. Will check tick labs, uric acid, vitamin levels, etc. Does have known fibromyalgia not currently on medication. Will await lab results and discuss further management based on these. OTC pain relievers, topicals for now

## 2019-07-27 NOTE — Assessment & Plan Note (Addendum)
Not at goal. Script given so she can check and log readings. Increase lisinopril HCTZ dose from 12.5 mg HCTZ to 25 mg HCTZ and recheck in 1 month

## 2019-07-27 NOTE — Assessment & Plan Note (Signed)
Stopped gabapentin due to fear of mood issues, and not ready to try anything Becherer at this time. Difficult to discern whether Boster joint pain issues are related so will run further tests to r/o other causes

## 2019-08-12 ENCOUNTER — Ambulatory Visit: Payer: BC Managed Care – PPO | Admitting: Family Medicine

## 2019-08-12 ENCOUNTER — Encounter: Payer: Self-pay | Admitting: Family Medicine

## 2019-08-12 ENCOUNTER — Other Ambulatory Visit: Payer: Self-pay

## 2019-08-12 VITALS — BP 124/86 | HR 82 | Temp 98.7°F | Ht 67.0 in | Wt 237.4 lb

## 2019-08-12 DIAGNOSIS — I10 Essential (primary) hypertension: Secondary | ICD-10-CM

## 2019-08-12 DIAGNOSIS — J4540 Moderate persistent asthma, uncomplicated: Secondary | ICD-10-CM

## 2019-08-12 DIAGNOSIS — M542 Cervicalgia: Secondary | ICD-10-CM | POA: Diagnosis not present

## 2019-08-12 MED ORDER — ALBUTEROL SULFATE HFA 108 (90 BASE) MCG/ACT IN AERS: 2.0000 | INHALATION_SPRAY | Freq: Four times a day (QID) | RESPIRATORY_TRACT | 1 refills | Status: DC | PRN

## 2019-08-12 MED ORDER — UMECLIDINIUM-VILANTEROL 62.5-25 MCG/INH IN AEPB: 1.0000 | INHALATION_SPRAY | Freq: Every day | RESPIRATORY_TRACT | 6 refills | Status: DC

## 2019-08-12 MED ORDER — CYCLOBENZAPRINE HCL 10 MG PO TABS: 10.0000 mg | ORAL_TABLET | Freq: Three times a day (TID) | ORAL | 1 refills | Status: DC | PRN

## 2019-08-12 MED ORDER — CETIRIZINE HCL 10 MG PO TABS: 10.0000 mg | ORAL_TABLET | Freq: Every day | ORAL | 11 refills | Status: DC

## 2019-08-12 NOTE — Assessment & Plan Note (Signed)
BPs stable and WNL, continue current regimen 

## 2019-08-12 NOTE — Assessment & Plan Note (Signed)
Refill inhalers, discussed importance of good allergy regimen to avoid exacerbations and regular use. Declines steroid inhaler for maintenance, has been on anoro for a while and wants to stay with that

## 2019-08-12 NOTE — Progress Notes (Signed)
BP 124/86 (BP Location: Right Arm, Patient Position: Sitting, Cuff Size: Normal)   Pulse 82   Temp 98.7 F (37.1 C) (Oral)   Ht _0  (1.702 m)   Wt 237 lb 6.4 oz (107.7 kg)   SpO2 97%   BMI 37.18 kg/m    Subjective:    Patient ID: Meredith Butler, female    DOB: 08-Sep-1956, 63 y.o.   MRN: [id number]  HPI: Meredith Butler is a 63 y.o. female  Chief Complaint  Patient presents with  . Hypertension    4 week Follow-up  . Asthma    Patient needs refill on Anoro and [facility] for albueterol inhaler, insurance won't pay for [employer].    Here today for 1 month f/u HTN. Home BPs 120-140/70s when checked, typically on the lower end of that. Taking her medicine faithfully and tolerating well. Denies CP, dizziness, HAs.   Has been out of her anoro and albuterol lately, typically needs them more with season changes. Thinks she's having some worsening SOB and sinus burning the past 2 weeks since fall weather came on. Denies fevers, chills, severe SOB. Takes benadryl occasionally but has not lately.   Went to chiropractor for left neck pain and stiffness, has been using ice to the area as well as prn flexeril with very minimal relief. This has been ongoing the past few days. Denies fevers, chills, injury, radiation down arm.   Relevant past medical, surgical, family and social history reviewed and updated as indicated. Interim medical history since our last visit reviewed. Allergies and medications reviewed and updated.  Review of Systems  Per HPI unless specifically indicated above     Objective:    BP 124/86 (BP Location: Right Arm, Patient Position: Sitting, Cuff Size: Normal)   Pulse 82   Temp 98.7 F (37.1 C) (Oral)   Ht _1  (1.702 m)   Wt 237 lb 6.4 oz (107.7 kg)   SpO2 97%   BMI 37.18 kg/m   Wt Readings from Last 3 Encounters:  08/12/19 237 lb 6.4 oz (107.7 kg)  07/15/19 238 lb (108 kg)  04/10/19 232 lb (105.2 kg)    Physical Exam Vitals signs and nursing note reviewed.   Constitutional:      Appearance: Normal appearance. She is not ill-appearing.  HENT:     Head: Atraumatic.  Eyes:     Extraocular Movements: Extraocular movements intact.     Conjunctiva/sclera: Conjunctivae normal.  Neck:     Musculoskeletal: Normal range of motion and neck supple.  Cardiovascular:     Rate and Rhythm: Normal rate and regular rhythm.     Heart sounds: Normal heart sounds.  Pulmonary:     Effort: Pulmonary effort is normal.     Breath sounds: Normal breath sounds. No wheezing or rales.  Musculoskeletal: Normal range of motion.        General: Tenderness (ttp and spasm b/l trapezius muscles, with decreased ROM in neck due to discomfort and stiffness) present.  Skin:    General: Skin is warm and dry.  Neurological:     Mental Status: She is alert and oriented to person, place, and time.  Psychiatric:        Mood and Affect: Mood normal.        Thought Content: Thought content normal.        Judgment: Judgment normal.     Results for orders placed or performed in visit on 07/15/19  Microscopic Examination   URINE  Result  Value Ref Range   WBC, UA 6-10 (A) 0 - 5 /hpf   RBC 0-2 0 - 2 /hpf   Epithelial Cells (non renal) 0-10 0 - 10 /hpf   Crystals Present N/A   Crystal Type Uric Acid N/A   Bacteria, UA Many (A) None seen/Few  Urine Culture, Reflex   URINE  Result Value Ref Range   Urine Culture, Routine Final report (A)    Organism ID, Bacteria Escherichia coli (A)    Antimicrobial Susceptibility Comment   CBC with Differential/Platelet  Result Value Ref Range   WBC 6.1 3.4 - 10.8 x10E3/uL   RBC 4.89 3.77 - 5.28 x10E6/uL   Hemoglobin 15.5 11.1 - 15.9 g/dL   Hematocrit 44.1 34.0 - 46.6 %   MCV 90 79 - 97 fL   MCH 31.7 26.6 - 33.0 pg   MCHC 35.1 31.5 - 35.7 g/dL   RDW 12.4 11.7 - 15.4 %   Platelets 310 150 - 450 x10E3/uL   Neutrophils 66 Not Estab. %   Lymphs 20 Not Estab. %   Monocytes 8 Not Estab. %   Eos 5 Not Estab. %   Basos 1 Not Estab. %    Neutrophils Absolute 4.0 1.4 - 7.0 x10E3/uL   Lymphocytes Absolute 1.2 0.7 - 3.1 x10E3/uL   Monocytes Absolute 0.5 0.1 - 0.9 x10E3/uL   EOS (ABSOLUTE) 0.3 0.0 - 0.4 x10E3/uL   Basophils Absolute 0.1 0.0 - 0.2 x10E3/uL   Immature Granulocytes 0 Not Estab. %   Immature Grans (Abs) 0.0 0.0 - 0.1 x10E3/uL  Comprehensive metabolic panel  Result Value Ref Range   Glucose 115 (H) 65 - 99 mg/dL   BUN 13 8 - 27 mg/dL   Creatinine, Ser 0.87 0.57 - 1.00 mg/dL   GFR calc non Af Amer 71 >59 mL/min/1.73   GFR calc Af Amer 82 >59 mL/min/1.73   BUN/Creatinine Ratio 15 12 - 28   Sodium 142 134 - 144 mmol/L   Potassium 4.9 3.5 - 5.2 mmol/L   Chloride 100 96 - 106 mmol/L   CO2 22 20 - 29 mmol/L   Calcium 9.9 8.7 - 10.3 mg/dL   Total Protein 7.6 6.0 - 8.5 g/dL   Albumin 4.9 (H) 3.8 - 4.8 g/dL   Globulin, Total 2.7 1.5 - 4.5 g/dL   Albumin/Globulin Ratio 1.8 1.2 - 2.2   Bilirubin Total 0.6 0.0 - 1.2 mg/dL   Alkaline Phosphatase 88 39 - 117 IU/L   AST 26 0 - 40 IU/L   ALT 30 0 - 32 IU/L  Lipid Panel w/o Chol/HDL Ratio  Result Value Ref Range   Cholesterol, Total 206 (H) 100 - 199 mg/dL   Triglycerides 152 (H) 0 - 149 mg/dL   HDL 50 >39 mg/dL   VLDL Cholesterol Cal 30 5 - 40 mg/dL   LDL Calculated 126 (H) 0 - 99 mg/dL  TSH  Result Value Ref Range   TSH 2.320 0.450 - 4.500 uIU/mL  UA/M w/rflx Culture, Routine   Specimen: Urine   URINE  Result Value Ref Range   Specific Gravity, UA 1.020 1.005 - 1.030   pH, UA 5.5 5.0 - 7.5   Color, UA Yellow Yellow   Appearance Ur Clear Clear   Leukocytes,UA Negative Negative   Protein,UA Negative Negative/Trace   Glucose, UA Negative Negative   Ketones, UA Negative Negative   RBC, UA Trace (A) Negative   Bilirubin, UA Negative Negative   Urobilinogen, Ur 0.2 0.2 - 1.0 mg/dL  Nitrite, UA Negative Negative   Microscopic Examination See below:    Urinalysis Reflex Comment   HgB A1c  Result Value Ref Range   Hgb A1c MFr Bld 7.0 (H) 4.8 - 5.6 %   Est.  average glucose Bld gHb Est-mCnc 154 mg/dL  Sed Rate (ESR)  Result Value Ref Range   Sed Rate 10 0 - 40 mm/hr  Rheumatoid Factor  Result Value Ref Range   Rhuematoid fact SerPl-aCnc <10.0 0.0 - 13.9 IU/mL  Lyme Ab/Western Blot Reflex  Result Value Ref Range   Lyme IgG/IgM Ab <0.91 0.00 - 0.90 ISR   LYME DISEASE AB, QUANT, IGM <0.80 0.00 - 0.79 index  Rocky mtn spotted fvr abs pnl(IgG+IgM)  Result Value Ref Range   RMSF IgG Negative Negative   RMSF IgM 0.37 0.00 - 0.89 index  Uric acid  Result Value Ref Range   Uric Acid 8.4 (H) 2.5 - 7.1 mg/dL  Cytology - PAP  Result Value Ref Range   Adequacy Satisfactory for evaluation.    Diagnosis      NEGATIVE FOR INTRAEPITHELIAL LESIONS OR MALIGNANCY.   HPV NOT Detected    Material Submitted Vaginal Pap [ThinPrep Imaged]       Assessment & Plan:   Problem List Items Addressed This Visit      Cardiovascular and Mediastinum   Essential hypertension, benign - Primary    BPs stable and WNL, continue current regimen        Respiratory   Asthma    Refill inhalers, discussed importance of good allergy regimen to avoid exacerbations and regular use. Declines steroid inhaler for maintenance, has been on anoro for a while and wants to stay with that      Relevant Medications   albuterol (VENTOLIN HFA) 108 (90 Base) MCG/ACT inhaler   umeclidinium-vilanterol (ANORO ELLIPTA) 62.5-25 MCG/INH AEPB    Other Visit Diagnoses    Neck pain on left side       Appears muscular, continue working with chiropractor, massage, ice, try Wiebelhaus pillow. Flexeril prn       Follow up plan: Return in about 5 months (around 01/12/2020) for 6 month f/u.

## 2019-08-23 ENCOUNTER — Other Ambulatory Visit: Payer: Self-pay | Admitting: Unknown Physician Specialty

## 2019-08-23 NOTE — Telephone Encounter (Signed)
Requested medication (s) are due for refill today: no  Requested medication (s) are on the active medication list: no  Last refill: 06/01/2019  Future visit scheduled: yes  Notes to clinic:  Medication was discontinued    Requested Prescriptions  Pending Prescriptions Disp Refills   lisinopril-hydrochlorothiazide (ZESTORETIC) 20-12.5 MG tablet [Pharmacy Med Name: LISINOPRIL-HCTZ 20-12.5 MG TAB] 90 tablet 0    Sig: Take 1 tablet by mouth daily. Keep upcoming appointment     Cardiovascular:  ACEI + Diuretic Combos Passed - 08/23/2019  9:09 AM      Passed - Na in normal range and within 180 days    Sodium  Date Value Ref Range Status  07/15/2019 142 134 - 144 mmol/L Final         Passed - K in normal range and within 180 days    Potassium  Date Value Ref Range Status  07/15/2019 4.9 3.5 - 5.2 mmol/L Final         Passed - Cr in normal range and within 180 days    Creatinine, Ser  Date Value Ref Range Status  07/15/2019 0.87 0.57 - 1.00 mg/dL Final         Passed - Ca in normal range and within 180 days    Calcium  Date Value Ref Range Status  07/15/2019 9.9 8.7 - 10.3 mg/dL Final         Passed - Patient is not pregnant      Passed - Last BP in normal range    BP Readings from Last 1 Encounters:  08/12/19 124/86         Passed - Valid encounter within last 6 months    Recent Outpatient Visits          1 week ago Essential hypertension, benign   [facility] Volney American, [place]   1 month ago Essential hypertension, benign   [place], [place], [place]   4 months ago [place], [place], [place]   1 year ago Type 2 diabetes mellitus without complication, without long-term current use of insulin ([place])   [facility] Kathrine Haddock, NP   1 year ago Type 2 diabetes mellitus without complication, without long-term current use of insulin [facility])   [place], Jeannette How, MD      Future Appointments            In 4 months Orene Desanctis, Lilia Argue, PA-C [facility]

## 2019-10-05 ENCOUNTER — Ambulatory Visit (INDEPENDENT_AMBULATORY_CARE_PROVIDER_SITE_OTHER): Payer: BC Managed Care – PPO | Admitting: Family Medicine

## 2019-10-05 ENCOUNTER — Other Ambulatory Visit: Payer: Self-pay

## 2019-10-05 ENCOUNTER — Encounter: Payer: Self-pay | Admitting: Family Medicine

## 2019-10-05 DIAGNOSIS — J0100 Acute maxillary sinusitis, unspecified: Secondary | ICD-10-CM | POA: Diagnosis not present

## 2019-10-05 MED ORDER — AZITHROMYCIN 250 MG PO TABS: ORAL_TABLET | ORAL | 0 refills | Status: DC

## 2019-10-05 NOTE — Progress Notes (Signed)
There were no vitals taken for this visit.   Subjective:    Patient ID: Meredith Butler, female    DOB: 12/29/1955, 63 y.o.   MRN: [id number]  HPI: Meredith Butler is a 63 y.o. female  Chief Complaint  Patient presents with  . URI    pt states she has had sinus pressure, congestion and ear pain mainly on the left side for the past week     . This visit was completed via WebEx due to the restrictions of the COVID-19 pandemic. All issues as above were discussed and addressed. Physical exam was done as above through visual confirmation on WebEx. If it was felt that the patient should be evaluated in the office, they were directed there. The patient verbally consented to this visit. . Location of the patient: home . Location of the provider: work . Those involved with this call:  . Provider: Merrie Roof, PA-C . CMA: Yvonna Alanis, [place] . Front Desk/Registration: Jill Side  . Time spent on call: 15 minutes with patient face to face via video conference. More than 50% of this time was spent in counseling and coordination of care. 5 minutes total spent in review of patient's record and preparation of their chart.  About a week of left sided sinus pain and pressure, congestion, chest tightness, sinus headache. Denies fever, chills, body aches, sick contacts, recent travel, N/V/D, loss of taste or smell. Taking zyrtec daily and using albuterol inhaler with mild temporary relief. Hx of allergic rhinitis and asthma.   Relevant past medical, surgical, family and social history reviewed and updated as indicated. Interim medical history since our last visit reviewed. Allergies and medications reviewed and updated.  Review of Systems  Per HPI unless specifically indicated above     Objective:    There were no vitals taken for this visit.  Wt Readings from Last 3 Encounters:  08/12/19 237 lb 6.4 oz (107.7 kg)  07/15/19 238 lb (108 kg)  04/10/19 232 lb (105.2 kg)    Physical Exam Vitals signs  and nursing note reviewed.  Constitutional:      General: She is not in acute distress.    Appearance: Normal appearance.  HENT:     Head: Atraumatic.     Right Ear: External ear normal.     Left Ear: External ear normal.     Nose: Congestion present.     Mouth/Throat:     Mouth: Mucous membranes are moist.     Pharynx: Oropharynx is clear. Posterior oropharyngeal erythema present.  Eyes:     Extraocular Movements: Extraocular movements intact.     Conjunctiva/sclera: Conjunctivae normal.  Neck:     Musculoskeletal: Normal range of motion.  Cardiovascular:     Comments: Unable to assess via virtual visit Pulmonary:     Effort: Pulmonary effort is normal. No respiratory distress.  Musculoskeletal: Normal range of motion.  Skin:    General: Skin is dry.     Findings: No erythema.  Neurological:     Mental Status: She is alert and oriented to person, place, and time.  Psychiatric:        Mood and Affect: Mood normal.        Thought Content: Thought content normal.        Judgment: Judgment normal.     Results for orders placed or performed in visit on 07/15/19  Microscopic Examination   URINE  Result Value Ref Range   WBC, UA 6-10 (A) 0 - 5 /  hpf   RBC 0-2 0 - 2 /hpf   Epithelial Cells (non renal) 0-10 0 - 10 /hpf   Crystals Present N/A   Crystal Type Uric Acid N/A   Bacteria, UA Many (A) None seen/Few  Urine Culture, Reflex   URINE  Result Value Ref Range   Urine Culture, Routine Final report (A)    Organism ID, Bacteria Escherichia coli (A)    Antimicrobial Susceptibility Comment   CBC with Differential/Platelet  Result Value Ref Range   WBC 6.1 3.4 - 10.8 x10E3/uL   RBC 4.89 3.77 - 5.28 x10E6/uL   Hemoglobin 15.5 11.1 - 15.9 g/dL   Hematocrit 44.1 34.0 - 46.6 %   MCV 90 79 - 97 fL   MCH 31.7 26.6 - 33.0 pg   MCHC 35.1 31.5 - 35.7 g/dL   RDW 12.4 11.7 - 15.4 %   Platelets 310 150 - 450 x10E3/uL   Neutrophils 66 Not Estab. %   Lymphs 20 Not Estab. %    Monocytes 8 Not Estab. %   Eos 5 Not Estab. %   Basos 1 Not Estab. %   Neutrophils Absolute 4.0 1.4 - 7.0 x10E3/uL   Lymphocytes Absolute 1.2 0.7 - 3.1 x10E3/uL   Monocytes Absolute 0.5 0.1 - 0.9 x10E3/uL   EOS (ABSOLUTE) 0.3 0.0 - 0.4 x10E3/uL   Basophils Absolute 0.1 0.0 - 0.2 x10E3/uL   Immature Granulocytes 0 Not Estab. %   Immature Grans (Abs) 0.0 0.0 - 0.1 x10E3/uL  Comprehensive metabolic panel  Result Value Ref Range   Glucose 115 (H) 65 - 99 mg/dL   BUN 13 8 - 27 mg/dL   Creatinine, Ser 0.87 0.57 - 1.00 mg/dL   GFR calc non Af Amer 71 >59 mL/min/1.73   GFR calc Af Amer 82 >59 mL/min/1.73   BUN/Creatinine Ratio 15 12 - 28   Sodium 142 134 - 144 mmol/L   Potassium 4.9 3.5 - 5.2 mmol/L   Chloride 100 96 - 106 mmol/L   CO2 22 20 - 29 mmol/L   Calcium 9.9 8.7 - 10.3 mg/dL   Total Protein 7.6 6.0 - 8.5 g/dL   Albumin 4.9 (H) 3.8 - 4.8 g/dL   Globulin, Total 2.7 1.5 - 4.5 g/dL   Albumin/Globulin Ratio 1.8 1.2 - 2.2   Bilirubin Total 0.6 0.0 - 1.2 mg/dL   Alkaline Phosphatase 88 39 - 117 IU/L   AST 26 0 - 40 IU/L   ALT 30 0 - 32 IU/L  Lipid Panel w/o Chol/HDL Ratio  Result Value Ref Range   Cholesterol, Total 206 (H) 100 - 199 mg/dL   Triglycerides 152 (H) 0 - 149 mg/dL   HDL 50 >39 mg/dL   VLDL Cholesterol Cal 30 5 - 40 mg/dL   LDL Calculated 126 (H) 0 - 99 mg/dL  TSH  Result Value Ref Range   TSH 2.320 0.450 - 4.500 uIU/mL  UA/M w/rflx Culture, Routine   Specimen: Urine   URINE  Result Value Ref Range   Specific Gravity, UA 1.020 1.005 - 1.030   pH, UA 5.5 5.0 - 7.5   Color, UA Yellow Yellow   Appearance Ur Clear Clear   Leukocytes,UA Negative Negative   Protein,UA Negative Negative/Trace   Glucose, UA Negative Negative   Ketones, UA Negative Negative   RBC, UA Trace (A) Negative   Bilirubin, UA Negative Negative   Urobilinogen, Ur 0.2 0.2 - 1.0 mg/dL   Nitrite, UA Negative Negative   Microscopic Examination See below:  Urinalysis Reflex Comment   HgB  A1c  Result Value Ref Range   Hgb A1c MFr Bld 7.0 (H) 4.8 - 5.6 %   Est. average glucose Bld gHb Est-mCnc 154 mg/dL  Sed Rate (ESR)  Result Value Ref Range   Sed Rate 10 0 - 40 mm/hr  Rheumatoid Factor  Result Value Ref Range   Rhuematoid fact SerPl-aCnc <10.0 0.0 - 13.9 IU/mL  Lyme Ab/Western Blot Reflex  Result Value Ref Range   Lyme IgG/IgM Ab <0.91 0.00 - 0.90 ISR   LYME DISEASE AB, QUANT, IGM <0.80 0.00 - 0.79 index  Rocky mtn spotted fvr abs pnl(IgG+IgM)  Result Value Ref Range   RMSF IgG Negative Negative   RMSF IgM 0.37 0.00 - 0.89 index  Uric acid  Result Value Ref Range   Uric Acid 8.4 (H) 2.5 - 7.1 mg/dL  Cytology - PAP  Result Value Ref Range   Adequacy Satisfactory for evaluation.    Diagnosis      NEGATIVE FOR INTRAEPITHELIAL LESIONS OR MALIGNANCY.   HPV NOT Detected    Material Submitted Vaginal Pap [ThinPrep Imaged]       Assessment & Plan:   Problem List Items Addressed This Visit    None    Visit Diagnoses    Acute maxillary sinusitis, recurrence not specified    -  Primary   Infection assoc with asthma flare, tx with zpak, mucinex, sinus rinses, allergy and asthma regimen. F/u if worsening or not improving. Quarantine until improved   Relevant Medications   azithromycin (ZITHROMAX) 250 MG tablet       Follow up plan: Return if symptoms worsen or fail to improve.

## 2019-10-07 ENCOUNTER — Telehealth: Payer: Self-pay | Admitting: *Deleted

## 2019-10-07 DIAGNOSIS — Z87891 Personal history of nicotine dependence: Secondary | ICD-10-CM

## 2019-10-07 DIAGNOSIS — Z122 Encounter for screening for malignant neoplasm of respiratory organs: Secondary | ICD-10-CM

## 2019-10-07 NOTE — Telephone Encounter (Signed)
Left a voicemail for Meredith Butler to notify her that it is time to schedule annual low dose lung cancer screening CT scan. Instructed patient to call back to verify information prior to the scan being scheduled.

## 2019-10-08 NOTE — Telephone Encounter (Signed)
Patient has been notified that annual lung cancer screening low dose CT scan is due currently or will be in near future. Confirmed that patient is within the age range of 55-77, and asymptomatic, (no signs or symptoms of lung cancer). Patient denies illness that would prevent curative treatment for lung cancer if found. Verified smoking history, (former, quit 11/20/09, 60 pack year). The shared decision making visit was done 07/03/18. Patient is agreeable for CT scan being scheduled.

## 2019-10-12 ENCOUNTER — Other Ambulatory Visit: Payer: Self-pay

## 2019-10-12 ENCOUNTER — Ambulatory Visit
Admission: RE | Admit: 2019-10-12 | Discharge: 2019-10-12 | Disposition: A | Discharge status: Home / Self Care | Payer: BC Managed Care – PPO | Source: Ambulatory Visit | Attending: Nurse Practitioner | Admitting: Nurse Practitioner

## 2019-10-12 DIAGNOSIS — Z122 Encounter for screening for malignant neoplasm of respiratory organs: Secondary | ICD-10-CM

## 2019-10-12 DIAGNOSIS — Z87891 Personal history of nicotine dependence: Secondary | ICD-10-CM | POA: Diagnosis present

## 2019-10-13 ENCOUNTER — Other Ambulatory Visit: Payer: Self-pay | Admitting: Family Medicine

## 2019-10-13 NOTE — Telephone Encounter (Signed)
Requested Prescriptions  Pending Prescriptions Disp Refills  . lisinopril-hydrochlorothiazide (ZESTORETIC) 20-25 MG tablet [Pharmacy Med Name: LISINOPRIL-HCTZ 20-25 MG TAB] 90 tablet 0    Sig: TAKE 1 TABLET BY MOUTH EVERY DAY     Cardiovascular:  ACEI + Diuretic Combos Passed - 10/13/2019 10:36 AM      Passed - Na in normal range and within 180 days    Sodium  Date Value Ref Range Status  07/15/2019 142 134 - 144 mmol/L Final         Passed - K in normal range and within 180 days    Potassium  Date Value Ref Range Status  07/15/2019 4.9 3.5 - 5.2 mmol/L Final         Passed - Cr in normal range and within 180 days    Creatinine, Ser  Date Value Ref Range Status  07/15/2019 0.87 0.57 - 1.00 mg/dL Final         Passed - Ca in normal range and within 180 days    Calcium  Date Value Ref Range Status  07/15/2019 9.9 8.7 - 10.3 mg/dL Final         Passed - Patient is not pregnant      Passed - Last BP in normal range    BP Readings from Last 1 Encounters:  08/12/19 124/86         Passed - Valid encounter within last 6 months    Recent Outpatient Visits          1 week ago Acute maxillary sinusitis, recurrence not specified   [facility] Volney American, PA-C   2 months ago Essential hypertension, benign   [place], [place], [place]   3 months ago Essential hypertension, benign   [place], [place], [place]   6 months ago [place], [place], [place]   1 year ago Type 2 diabetes mellitus without complication, without long-term current use of insulin ([place])   [place] Kathrine Haddock, NP      Future Appointments            In 3 months Orene Desanctis, Lilia Argue, PA-C [facility], [facility]

## 2019-10-19 ENCOUNTER — Encounter: Payer: Self-pay | Admitting: *Deleted

## 2019-10-22 ENCOUNTER — Other Ambulatory Visit: Payer: Self-pay | Admitting: Unknown Physician Specialty

## 2019-10-22 MED ORDER — FLUCONAZOLE 150 MG PO TABS: 150.0000 mg | ORAL_TABLET | Freq: Once | ORAL | 0 refills | Status: AC

## 2019-10-28 ENCOUNTER — Other Ambulatory Visit: Payer: Self-pay | Admitting: Family Medicine

## 2019-11-05 ENCOUNTER — Other Ambulatory Visit: Payer: Self-pay | Admitting: Family Medicine

## 2019-12-24 ENCOUNTER — Ambulatory Visit: Payer: BC Managed Care – PPO | Admitting: Nurse Practitioner

## 2019-12-24 ENCOUNTER — Other Ambulatory Visit: Payer: Self-pay

## 2019-12-24 ENCOUNTER — Encounter: Payer: Self-pay | Admitting: Nurse Practitioner

## 2019-12-24 VITALS — BP 138/98 | HR 84 | Temp 98.4°F | Ht 68.0 in | Wt 238.0 lb

## 2019-12-24 DIAGNOSIS — I700 Atherosclerosis of aorta: Secondary | ICD-10-CM | POA: Diagnosis not present

## 2019-12-24 DIAGNOSIS — E119 Type 2 diabetes mellitus without complications: Secondary | ICD-10-CM

## 2019-12-24 DIAGNOSIS — I10 Essential (primary) hypertension: Secondary | ICD-10-CM

## 2019-12-24 LAB — BAYER DCA HB A1C WAIVED: HB A1C (BAYER DCA - WAIVED): 7.4 % — ABNORMAL HIGH (ref <7.0)

## 2019-12-24 MED ORDER — LISINOPRIL 10 MG PO TABS: 10.0000 mg | ORAL_TABLET | Freq: Every day | ORAL | 0 refills | Status: DC

## 2019-12-24 NOTE — Progress Notes (Signed)
BP (!) 138/98 (BP Location: Left Arm, Cuff Size: Normal)   Pulse 84   Temp 98.4 F (36.9 C) (Oral)   Ht 5\' 8"  (1.727 m)   Wt 238 lb (108 kg)   SpO2 95%   BMI 36.19 kg/m    Subjective:    Patient ID: Meredith Butler, female    DOB: [birth year], 64 y.o.   MRN: [id number]  HPI: Meredith Butler is a 65 y.o. female  Chief Complaint  Patient presents with  . Hypertension    Patient complaining of her blood pressure readings being high.   HYPERTENSION Hypertension status: uncontrolled  Satisfied with current treatment? no Duration of hypertension: chronic BP monitoring frequency:  daily BP range: as high as 160/100; as low as 117/86 BP medication side effects:  no Medication compliance: excellent compliance Previous BP meds:lisinopril and lisinopril-HCTZ Aspirin: yes Recurrent headaches: yes Visual changes: no Palpitations: no Dyspnea: no Chest pain: yes; went to ED for chest pain; no chest pain currently Lower extremity edema: yes; ankles and feet only Dizzy/lightheaded: yes  Nausea: yes  DIABETES Hypoglycemic episodes:no Polydipsia/polyuria: no Visual disturbance: no Chest pain: yes Paresthesias: no Glucose Monitoring: yes  Accucheck frequency: Daily  Fasting glucose: ~135 Blood Pressure Monitoring: a few times a day Retinal Examination: Not up to Date ; going to set up appointment Foot Exam: Not up to Date Diabetic Education: Completed Pneumovax: Not up to Date; refused Influenza: Not up to Date; refused Aspirin: yes  Allergies  Allergen Reactions  . Claforan [Cefotaxime] Anaphylaxis  . Ibuprofen Anaphylaxis  . Omnicef [Cefdinir] Anaphylaxis  . Penicillins Anaphylaxis    Has patient had a PCN reaction causing immediate rash, facial/tongue/throat swelling, SOB or lightheadedness with hypotension: Yes Has patient had a PCN reaction causing severe rash involving mucus membranes or skin necrosis: No Has patient had a PCN reaction that required hospitalization: Yes Has  patient had a PCN reaction occurring within the last 10 years: Yes If all of the above answers are "NO", then may proceed with Cephalosporin use.   . Sulfa Antibiotics Anaphylaxis  . Tetanus Toxoids Anaphylaxis  . Voltaren [Diclofenac Sodium] Anaphylaxis  . Codeine     Makes her crazy   . Diovan [Valsartan] Swelling  . Rocephin [Ceftriaxone]     Eyes turn blood red   . Statins     myalgias   Outpatient Encounter Medications as of 12/24/2019  Medication Sig  . albuterol (VENTOLIN HFA) 108 (90 Base) MCG/ACT inhaler Inhale 2 puffs into the lungs every 6 (six) hours as needed for wheezing or shortness of breath.  Marland Kitchen azithromycin (ZITHROMAX) 250 MG tablet Take 2 tabs day one, then 1 tab daily until complete  . cetirizine (ZYRTEC) 10 MG tablet Take 1 tablet (10 mg total) by mouth daily.  . Cholecalciferol (VITAMIN D3 PO) Take 1 Dose by mouth daily.  . cyclobenzaprine (FLEXERIL) 10 MG tablet Take 1 tablet (10 mg total) by mouth 3 (three) times daily as needed for muscle spasms.  Marland Kitchen EPINEPHrine (EPIPEN 2-PAK) 0.3 mg/0.3 mL IJ SOAJ injection Inject 0.3 mLs (0.3 mg total) into the muscle as needed for anaphylaxis.  Marland Kitchen ezetimibe (ZETIA) 10 MG tablet TAKE 1 TABLET BY MOUTH EVERY DAY  . glucose blood test strip Use as instructed  . lisinopril-hydrochlorothiazide (ZESTORETIC) 20-25 MG tablet TAKE 1 TABLET BY MOUTH EVERY DAY  . Magnesium 250 MG TABS Take 250 mg by mouth daily as needed.  . metFORMIN (GLUCOPHAGE) 500 MG tablet TAKE 1 TABLET (500 MG TOTAL)  BY MOUTH 2 (TWO) TIMES DAILY WITH A MEAL.  . Multiple Vitamins-Minerals (ZINC PO) Take 1 tablet by mouth once a week. Takes once on Monday  . umeclidinium-vilanterol (ANORO ELLIPTA) 62.5-25 MCG/INH AEPB Inhale 1 puff into the lungs daily.  Marland Kitchen lisinopril (ZESTRIL) 10 MG tablet Take 1 tablet (10 mg total) by mouth daily.  Marland Kitchen VITAMIN E PO Take 1 Dose by mouth daily.   No facility-administered encounter medications on file as of 12/24/2019.   Patient Active  Problem List   Diagnosis Date Noted  . Aortic atherosclerosis ([place]) 10/13/2018  . Depression, major, single episode, moderate ([place]) 04/23/2018  . Type 2 diabetes mellitus without complication, without long-term current use of insulin ([place]) 12/24/2017  . Essential hypertension, benign 12/24/2017  . Hypercholesteremia 12/24/2017  . Fibromyalgia 12/24/2017  . Polyarthralgia 12/24/2017  . Asthma 12/24/2017  . Elevated TSH 12/24/2017   Past Medical History:  Diagnosis Date  . Asthma   . Diabetes mellitus without complication ([place])   . Fibromyalgia   . Hypertension   . Lupus ([place])   . Peritonitis ([place])    Review of Systems  Constitutional: Negative.  Negative for activity change, chills, diaphoresis, fatigue and fever.  Eyes: Negative.  Negative for visual disturbance.  Respiratory: Negative.  Negative for chest tightness, shortness of breath and wheezing.   Cardiovascular: Positive for leg swelling. Negative for chest pain and palpitations.  Gastrointestinal: Negative.  Negative for nausea.  Endocrine: Negative.  Negative for polydipsia and polyuria.  Neurological: Positive for dizziness, light-headedness and headaches. Negative for weakness and numbness.  Psychiatric/Behavioral: Negative.  Negative for agitation and confusion. The patient is not nervous/anxious.     Per HPI unless specifically indicated above     Objective:    BP (!) 138/98 (BP Location: Left Arm, Cuff Size: Normal)   Pulse 84   Temp 98.4 F (36.9 C) (Oral)   Ht 5\' 8"  (1.727 m)   Wt 238 lb (108 kg)   SpO2 95%   BMI 36.19 kg/m   Wt Readings from Last 3 Encounters:  12/24/19 238 lb (108 kg)  10/12/19 237 lb (107.5 kg)  08/12/19 237 lb 6.4 oz (107.7 kg)    Physical Exam Vitals and nursing note reviewed.  Constitutional:      General: She is not in acute distress.    Appearance: Normal appearance. She is normal weight. She is not ill-appearing or toxic-appearing.  HENT:     Head: Normocephalic and  atraumatic.  Cardiovascular:     Rate and Rhythm: Normal rate and regular rhythm.     Pulses: Normal pulses.     Heart sounds: No murmur.  Pulmonary:     Effort: Pulmonary effort is normal. No respiratory distress.     Breath sounds: Normal breath sounds.  Skin:    General: Skin is warm and dry.     Coloration: Skin is not jaundiced.  Neurological:     General: No focal deficit present.     Mental Status: She is alert and oriented to person, place, and time.     Motor: No weakness.     Gait: Gait normal.  Psychiatric:        Mood and Affect: Mood normal.        Behavior: Behavior normal.        Thought Content: Thought content normal.        Judgment: Judgment normal.       Assessment & Plan:   Problem List Items Addressed This  Visit      Cardiovascular and Mediastinum   Essential hypertension, benign - Primary    Chronic, ongoing.  BP with elevated readings at home, especially the bottom number.  Will add 10mg  Lisinopril to regimen and will f/u in 4 weeks.  Patient educated to stop medication if systolic BP 123456 and patient with dizziness/lightheadedness.  Patient reports prior episodes of chest pain and was following with Cardiologist for chest pain work-up but stopped going before work-up was complete.  With recent episode of chest pain, will send urgent referral to Cardiology in [place], pt requested location.       Relevant Medications   lisinopril (ZESTRIL) 10 MG tablet   Other Relevant Orders   Ambulatory referral to Cardiology   Aortic atherosclerosis ([place])   Relevant Medications   lisinopril (ZESTRIL) 10 MG tablet   Other Relevant Orders   Ambulatory referral to Cardiology     Endocrine   Type 2 diabetes mellitus without complication, without long-term current use of insulin ([facility]) (Chronic)    Chronic, ongoing.  Pt reports elevated fasting blood sugars.  Will check A1c and BMET today.  May consider increasing Metformin if A1c remains elevated.      Relevant  Medications   lisinopril (ZESTRIL) 10 MG tablet   Other Relevant Orders   Bayer DCA Hb A1c Waived   Basic Metabolic Panel (BMET)      Follow up plan: Return in about 4 weeks (around 01/21/2020) for BP recheck.

## 2019-12-24 NOTE — Assessment & Plan Note (Addendum)
Chronic, ongoing.  BP with elevated readings at home, especially the bottom number.  Will add 10mg  Lisinopril to regimen and will f/u in 4 weeks.  Patient educated to stop medication if systolic BP 123456 and patient with dizziness/lightheadedness.  Patient reports prior episodes of chest pain and was following with Cardiologist for chest pain work-up but stopped going before work-up was complete.  With recent episode of chest pain, will send urgent referral to Cardiology in [place], pt requested location.

## 2019-12-24 NOTE — Assessment & Plan Note (Signed)
Chronic, ongoing.  Pt reports elevated fasting blood sugars.  Will check A1c and BMET today.  May consider increasing Metformin if A1c remains elevated.

## 2019-12-24 NOTE — Patient Instructions (Signed)
Check BP and if top # is less than 110, stop [place] Lisinopril and call us.  DASH Eating Plan DASH stands for "Dietary Approaches to Stop Hypertension." The DASH eating plan is a healthy eating plan that has been shown to reduce high blood pressure (hypertension). It may also reduce your risk for type 2 diabetes, heart disease, and stroke. The DASH eating plan may also help with weight loss. What are tips for following this plan?  General guidelines  Avoid eating more than 2,300 mg (milligrams) of salt (sodium) a day. If you have hypertension, you may need to reduce your sodium intake to 1,500 mg a day.  Limit alcohol intake to no more than 1 drink a day for nonpregnant women and 2 drinks a day for men. One drink equals 12 oz of beer, 5 oz of wine, or 1 oz of hard liquor.  Work with your health care provider to maintain a healthy body weight or to lose weight. Ask what an ideal weight is for you.  Get at least 30 minutes of exercise that causes your heart to beat faster (aerobic exercise) most days of the week. Activities may include walking, swimming, or biking.  Work with your health care provider or diet and nutrition specialist (dietitian) to adjust your eating plan to your individual calorie needs. Reading food labels   Check food labels for the amount of sodium per serving. Choose foods with less than 5 percent of the Daily Value of sodium. Generally, foods with less than 300 mg of sodium per serving fit into this eating plan.  To find whole grains, look for the word "whole" as the first word in the ingredient list. Shopping  Buy products labeled as "low-sodium" or "no salt added."  Buy fresh foods. Avoid canned foods and premade or frozen meals. Cooking  Avoid adding salt when cooking. Use salt-free seasonings or herbs instead of table salt or sea salt. Check with your health care provider or pharmacist before using salt substitutes.  Do not fry foods. Cook foods using healthy  methods such as baking, boiling, grilling, and broiling instead.  Cook with heart-healthy oils, such as olive, canola, soybean, or sunflower oil. Meal planning  Eat a balanced diet that includes: ? 5 or more servings of fruits and vegetables each day. At each meal, try to fill half of your plate with fruits and vegetables. ? Up to 6-8 servings of whole grains each day. ? Less than 6 oz of lean meat, poultry, or fish each day. A 3-oz serving of meat is about the same size as a deck of cards. One egg equals 1 oz. ? 2 servings of low-fat dairy each day. ? A serving of nuts, seeds, or beans 5 times each week. ? Heart-healthy fats. Healthy fats called Omega-3 fatty acids are found in foods such as flaxseeds and coldwater fish, like sardines, salmon, and mackerel.  Limit how much you eat of the following: ? Canned or prepackaged foods. ? Food that is high in trans fat, such as fried foods. ? Food that is high in saturated fat, such as fatty meat. ? Sweets, desserts, sugary drinks, and other foods with added sugar. ? Full-fat dairy products.  Do not salt foods before eating.  Try to eat at least 2 vegetarian meals each week.  Eat more home-cooked food and less restaurant, buffet, and fast food.  When eating at a restaurant, ask that your food be prepared with less salt or no salt, if possible. What  foods are recommended? The items listed Federici not be a complete list. Talk with your dietitian about what dietary choices are best for you. Grains Whole-grain or whole-wheat bread. Whole-grain or whole-wheat pasta. Brown rice. [place]. Bulgur. Whole-grain and low-sodium cereals. Pita bread. Low-fat, low-sodium crackers. Whole-wheat flour tortillas. Vegetables Fresh or frozen vegetables (raw, steamed, roasted, or grilled). Low-sodium or reduced-sodium tomato and vegetable juice. Low-sodium or reduced-sodium tomato sauce and tomato paste. Low-sodium or reduced-sodium canned  vegetables. Fruits All fresh, dried, or frozen fruit. Canned fruit in natural juice (without added sugar). Meat and other protein foods Skinless chicken or [place]. Ground chicken or [place]. Pork with fat trimmed off. Fish and seafood. Egg whites. Dried beans, peas, or lentils. Unsalted nuts, nut butters, and seeds. Unsalted canned beans. Lean cuts of beef with fat trimmed off. Low-sodium, lean deli meat. Dairy Low-fat (1%) or fat-free (skim) milk. Fat-free, low-fat, or reduced-fat cheeses. Nonfat, low-sodium ricotta or cottage cheese. Low-fat or nonfat yogurt. Low-fat, low-sodium cheese. Fats and oils Soft margarine without trans fats. Vegetable oil. Low-fat, reduced-fat, or light mayonnaise and salad dressings (reduced-sodium). Canola, safflower, olive, soybean, and sunflower oils. Avocado. Seasoning and other foods Herbs. Spices. Seasoning mixes without salt. Unsalted popcorn and pretzels. Fat-free sweets. What foods are not recommended? The items listed [place] not be a complete list. Talk with your dietitian about what dietary choices are best for you. Grains Baked goods made with fat, such as croissants, muffins, or some breads. Dry pasta or rice meal packs. Vegetables Creamed or fried vegetables. Vegetables in a cheese sauce. Regular canned vegetables (not low-sodium or reduced-sodium). Regular canned tomato sauce and paste (not low-sodium or reduced-sodium). Regular tomato and vegetable juice (not low-sodium or reduced-sodium). Angie Fava. Olives. Fruits Canned fruit in a light or heavy syrup. Fried fruit. Fruit in cream or butter sauce. Meat and other protein foods Fatty cuts of meat. Ribs. Fried meat. Berniece Salines. Sausage. Bologna and other processed lunch meats. Salami. Fatback. Hotdogs. Bratwurst. Salted nuts and seeds. Canned beans with added salt. Canned or smoked fish. Whole eggs or egg yolks. Chicken or [place] with skin. Dairy Whole or 2% milk, cream, and half-and-half. Whole or full-fat  cream cheese. Whole-fat or sweetened yogurt. Full-fat cheese. Nondairy creamers. Whipped toppings. Processed cheese and cheese spreads. Fats and oils Butter. Stick margarine. Lard. Shortening. Ghee. Bacon fat. Tropical oils, such as coconut, palm kernel, or palm oil. Seasoning and other foods Salted popcorn and pretzels. Onion salt, garlic salt, seasoned salt, table salt, and sea salt. Worcestershire sauce. Tartar sauce. Barbecue sauce. Teriyaki sauce. Soy sauce, including reduced-sodium. Steak sauce. Canned and packaged gravies. Fish sauce. Oyster sauce. Cocktail sauce. Horseradish that you find on the shelf. Ketchup. Mustard. Meat flavorings and tenderizers. Bouillon cubes. Hot sauce and Tabasco sauce. Premade or packaged marinades. Premade or packaged taco seasonings. Relishes. Regular salad dressings. Where to find more information:  National Heart, Lung, and [place]: https://wilson-eaton.com/  American Heart Association: www.heart.org Summary  The DASH eating plan is a healthy eating plan that has been shown to reduce high blood pressure (hypertension). It [place] also reduce your risk for type 2 diabetes, heart disease, and stroke.  With the DASH eating plan, you should limit salt (sodium) intake to 2,300 mg a day. If you have hypertension, you [place] need to reduce your sodium intake to 1,500 mg a day.  When on the DASH eating plan, aim to eat more fresh fruits and vegetables, whole grains, lean proteins, low-fat dairy, and heart-healthy fats.  Work with  your health care provider or diet and nutrition specialist (dietitian) to adjust your eating plan to your individual calorie needs. This information is not intended to replace advice given to you by your health care provider. Make sure you discuss any questions you have with your health care provider. Document Revised: 10/18/2017 Document Reviewed: 10/29/2016 Elsevier Patient Education  2020 [employer].

## 2019-12-25 ENCOUNTER — Telehealth: Payer: Self-pay | Admitting: Nurse Practitioner

## 2019-12-25 DIAGNOSIS — E119 Type 2 diabetes mellitus without complications: Secondary | ICD-10-CM

## 2019-12-25 LAB — BASIC METABOLIC PANEL
BUN/Creatinine Ratio: 15 (ref 12–28)
BUN: 14 mg/dL (ref 8–27)
CO2: 24 mmol/L (ref 20–29)
Calcium: 9.7 mg/dL (ref 8.7–10.3)
Chloride: 100 mmol/L (ref 96–106)
Creatinine, Ser: 0.93 mg/dL (ref 0.57–1.00)
GFR calc Af Amer: 76 mL/min/{1.73_m2} (ref >59)
GFR calc non Af Amer: 66 mL/min/{1.73_m2} (ref >59)
Glucose: 202 mg/dL — ABNORMAL HIGH (ref 65–99)
Potassium: 4 mmol/L (ref 3.5–5.2)
Sodium: 142 mmol/L (ref 134–144)

## 2019-12-25 LAB — SPECIMEN STATUS

## 2019-12-25 MED ORDER — METFORMIN HCL 1000 MG PO TABS: 1000.0000 mg | ORAL_TABLET | Freq: Two times a day (BID) | ORAL | 0 refills | Status: DC

## 2019-12-25 NOTE — Telephone Encounter (Signed)
I called and spoke with the patient about her A1c and BMET.  It was decided that we increase her Metformin to 1000mg  bid.  Patient verbalized understanding.  [place] prescription sent in.  Patient advised to f/u in 3 months for diabetes check.

## 2019-12-28 ENCOUNTER — Ambulatory Visit (INDEPENDENT_AMBULATORY_CARE_PROVIDER_SITE_OTHER): Payer: BC Managed Care – PPO | Admitting: Cardiology

## 2019-12-28 ENCOUNTER — Telehealth: Payer: Self-pay

## 2019-12-28 ENCOUNTER — Other Ambulatory Visit: Payer: Self-pay

## 2019-12-28 ENCOUNTER — Encounter: Payer: Self-pay | Admitting: Cardiology

## 2019-12-28 VITALS — BP 134/94 | HR 79 | Ht 68.0 in | Wt 239.0 lb

## 2019-12-28 DIAGNOSIS — E119 Type 2 diabetes mellitus without complications: Secondary | ICD-10-CM

## 2019-12-28 DIAGNOSIS — I209 Angina pectoris, unspecified: Secondary | ICD-10-CM

## 2019-12-28 DIAGNOSIS — E7800 Pure hypercholesterolemia, unspecified: Secondary | ICD-10-CM

## 2019-12-28 DIAGNOSIS — E663 Overweight: Secondary | ICD-10-CM

## 2019-12-28 DIAGNOSIS — I10 Essential (primary) hypertension: Secondary | ICD-10-CM

## 2019-12-28 DIAGNOSIS — Z789 Other specified health status: Secondary | ICD-10-CM | POA: Insufficient documentation

## 2019-12-28 DIAGNOSIS — Z87891 Personal history of nicotine dependence: Secondary | ICD-10-CM

## 2019-12-28 MED ORDER — ASPIRIN EC 81 MG PO TBEC: 81.0000 mg | DELAYED_RELEASE_TABLET | Freq: Every day | ORAL | 3 refills | Status: DC

## 2019-12-28 MED ORDER — NITROGLYCERIN 0.4 MG SL SUBL: 0.4000 mg | SUBLINGUAL_TABLET | SUBLINGUAL | 3 refills | Status: DC | PRN

## 2019-12-28 MED ORDER — ISOSORBIDE MONONITRATE ER 60 MG PO TB24: 60.0000 mg | ORAL_TABLET | Freq: Every day | ORAL | 3 refills | Status: DC

## 2019-12-28 NOTE — Progress Notes (Signed)
Cardiology Office Note:    Date:  12/28/2019   ID:  Meredith Butler, DOB 1955/12/09, MRN [id number]  PCP:  Volney American, PA-C  Cardiologist:  Jenean Lindau, MD   Referring MD: Carnella Guadalajara I, NP    ASSESSMENT:    1. Essential hypertension, benign   2. Hypercholesteremia   3. Type 2 diabetes mellitus without complication, without long-term current use of insulin ([place])   4. Angina pectoris ([place])   5. Ex-smoker   6. Overweight    PLAN:    In order of problems listed above:  1. Angina pectoris: Patient symptoms are very concerning.  The following recommendations were made to the patient.  She was advised to take a coated baby aspirin on a daily basis.  Sublingual nitroglycerin prescription was sent, its protocol and 911 protocol explained and the patient vocalized understanding questions were answered to the patient's satisfaction.In view of the patient's symptoms, I discussed with the patient options for evaluation. Invasive and noninvasive options were given to the patient. I discussed stress testing and coronary angiography and left heart catheterization at length. Benefits, pros and cons of each approach were discussed at length. Patient had multiple questions which were answered to the patient's satisfaction. Patient opted for invasive evaluation and we will set up for coronary angiography and left heart catheterization. Further recommendations will be made based on the findings with coronary angiography. In the interim if the patient has any significant symptoms in hospital to the nearest emergency room. 2. Essential hypertension: Blood pressure is stable.  She will monitor on a regular basis. 3. Mixed dyslipidemia and diabetes mellitus: Diet was emphasized.  Her medications for this are managed by her primary care physician.  Weight reduction was stressed and risks of obesity explained and she vocalized understanding. 4. She will be seen in follow-up appointment after coronary  angiography.  Patient had multiple questions which were answered to her satisfaction.   Medication Adjustments/Labs and Tests Ordered: Current medicines are reviewed at length with the patient today.  Concerns regarding medicines are outlined above.  No orders of the defined types were placed in this encounter.  No orders of the defined types were placed in this encounter.    History of Present Illness:    Meredith Butler is a 64 y.o. female who is being seen today for the evaluation of chest tightness at the request of Carnella Guadalajara I, NP.  Patient is a pleasant 64 year old female.  She has past medical history of essential hypertension, dyslipidemia and diabetes mellitus.  She is overweight.  She does not exercise on a regular basis.  She was a smoker but quit about a decade ago.  She mentions to me that consistently she has chest tightness when she is stressed out.  This goes to the neck into the arms.  This has caused concern to her.  When she rests she feels better.  She uses aspirin when this happens.  She has never used nitroglycerin.  At the time of my evaluation, the patient is alert awake oriented and in no distress.  Past Medical History:  Diagnosis Date  . Asthma   . Diabetes mellitus without complication ([place])   . Fibromyalgia   . Hypertension   . Lupus ([place])   . Peritonitis [facility])     Past Surgical History:  Procedure Laterality Date  . ABDOMINAL HYSTERECTOMY     complete  . ABDOMINAL SURGERY    . APPENDECTOMY    . CHOLECYSTECTOMY    .  COLON SURGERY    . JOINT REPLACEMENT Bilateral    knee  . SMALL INTESTINE SURGERY    . SPLENECTOMY, TOTAL    . TONSILLECTOMY      Current Medications: Current Meds  Medication Sig  . albuterol (VENTOLIN HFA) 108 (90 Base) MCG/ACT inhaler Inhale 2 puffs into the lungs every 6 (six) hours as needed for wheezing or shortness of breath.  . cetirizine (ZYRTEC) 10 MG tablet Take 1 tablet (10 mg total) by mouth daily.  . Cholecalciferol  (VITAMIN D3 PO) Take 1 Dose by mouth daily.  . cyclobenzaprine (FLEXERIL) 10 MG tablet Take 1 tablet (10 mg total) by mouth 3 (three) times daily as needed for muscle spasms.  Marland Kitchen EPINEPHrine (EPIPEN 2-PAK) 0.3 mg/0.3 mL IJ SOAJ injection Inject 0.3 mLs (0.3 mg total) into the muscle as needed for anaphylaxis.  Marland Kitchen ezetimibe (ZETIA) 10 MG tablet TAKE 1 TABLET BY MOUTH EVERY DAY  . glucose blood test strip Use as instructed  . lisinopril (ZESTRIL) 10 MG tablet Take 1 tablet (10 mg total) by mouth daily.  Marland Kitchen lisinopril-hydrochlorothiazide (ZESTORETIC) 20-25 MG tablet TAKE 1 TABLET BY MOUTH EVERY DAY  . Magnesium 250 MG TABS Take 250 mg by mouth daily as needed.  . metFORMIN (GLUCOPHAGE) 1000 MG tablet Take 1 tablet (1,000 mg total) by mouth 2 (two) times daily with a meal.  . Multiple Vitamins-Minerals (ZINC PO) Take 1 tablet by mouth once a week. Takes once on Monday  . umeclidinium-vilanterol (ANORO ELLIPTA) 62.5-25 MCG/INH AEPB Inhale 1 puff into the lungs daily.     Allergies:   Claforan [cefotaxime], Ibuprofen, Omnicef [cefdinir], Penicillins, Sulfa antibiotics, Tetanus toxoids, Voltaren [diclofenac sodium], Codeine, Diovan [valsartan], Rocephin [ceftriaxone], and Statins   Social History   Socioeconomic History  . Marital status: Married    Spouse name: Not on file  . Number of children: Not on file  . Years of education: Not on file  . Highest education level: Not on file  Occupational History  . Not on file  Tobacco Use  . Smoking status: Former Smoker    Packs/day: 2.00    Years: 30.00    Pack years: 60.00    Types: Cigarettes    Quit date: 11/20/2009    Years since quitting: 10.1  . Smokeless tobacco: Never Used  Substance and Sexual Activity  . Alcohol use: No  . Drug use: No  . Sexual activity: Yes  Other Topics Concern  . Not on file  Social History Narrative  . Not on file   Social Determinants of Health   Financial Resource Strain:   . Difficulty of Paying Living  Expenses: Not on file  Food Insecurity:   . Worried About Charity fundraiser in the Last Year: Not on file  . Ran Out of Food in the Last Year: Not on file  Transportation Needs:   . Lack of Transportation (Medical): Not on file  . Lack of Transportation (Non-Medical): Not on file  Physical Activity:   . Days of Exercise per Week: Not on file  . Minutes of Exercise per Session: Not on file  Stress:   . Feeling of Stress : Not on file  Social Connections:   . Frequency of Communication with Friends and Family: Not on file  . Frequency of Social Gatherings with Friends and Family: Not on file  . Attends Religious Services: Not on file  . Active Member of Clubs or Organizations: Not on file  . Attends Club  or Organization Meetings: Not on file  . Marital Status: Not on file     Family History: The patient's family history includes Cancer in her maternal grandmother, mother, paternal grandfather, and sister; Diabetes in her daughter, daughter, maternal aunt, maternal uncle, mother, and sister; Heart attack in her father and mother.  ROS:   Please see the history of present illness.    All other systems reviewed and are negative.  EKGs/Labs/Other Studies Reviewed:    The following studies were reviewed today: EKG reveals sinus rhythm and nonspecific ST-T changes.   Recent Labs: 07/15/2019: ALT 30; TSH 2.320 12/24/2019: BUN 14; Creatinine, Ser 0.93; Hemoglobin WILL FOLLOW; Platelets WILL FOLLOW; Potassium 4.0; Sodium 142  Recent Lipid Panel    Component Value Date/Time   CHOL 206 (H) 07/15/2019 1424   TRIG 152 (H) 07/15/2019 1424   HDL 50 07/15/2019 1424   LDLCALC 126 (H) 07/15/2019 1424    Physical Exam:    VS:  BP (!) 134/94   Pulse 79   Ht 5\' 8"  (1.727 m)   Wt 239 lb (108.4 kg)   SpO2 95%   BMI 36.34 kg/m     Wt Readings from Last 3 Encounters:  12/28/19 239 lb (108.4 kg)  12/24/19 238 lb (108 kg)  10/12/19 237 lb (107.5 kg)     GEN: Patient is in no acute  distress HEENT: Normal NECK: No JVD; No carotid bruits LYMPHATICS: No lymphadenopathy CARDIAC: S1 S2 regular, 2/6 systolic murmur at the apex. RESPIRATORY:  Clear to auscultation without rales, wheezing or rhonchi  ABDOMEN: Soft, non-tender, non-distended MUSCULOSKELETAL:  No edema; No deformity  SKIN: Warm and dry NEUROLOGIC:  Alert and oriented x 3 PSYCHIATRIC:  Normal affect    Signed, Jenean Lindau, MD  12/28/2019 2:11 PM    [facility]

## 2019-12-28 NOTE — Telephone Encounter (Signed)
Statin intolerant. Looks like she's on zetia now

## 2019-12-28 NOTE — Patient Instructions (Addendum)
Medication Instructions:  Your physician has recommended you make the following change in your medication:   You need to start taking 81 mg Aspirin daily. Start Imdur 60 mg daily.  You have a prescription for Nitroglycerin.  *If you need a refill on your cardiac medications before your next appointment, please call your pharmacy*  Lab Work: You had a BMP and a CBC today.  If you have labs (blood work) drawn today and your tests are completely normal, you will receive your results only by: Marland Kitchen MyChart Message (if you have MyChart) OR . A paper copy in the mail If you have any lab test that is abnormal or we need to change your treatment, we will call you to review the results.  Testing/Procedures: Your physician has requested that you have a cardiac catheterization. Cardiac catheterization is used to diagnose and/or treat various heart conditions. Doctors may recommend this procedure for a number of different reasons. The most common reason is to evaluate chest pain. Chest pain can be a symptom of coronary artery disease (CAD), and cardiac catheterization can show whether plaque is narrowing or blocking your heart's arteries. This procedure is also used to evaluate the valves, as well as measure the blood flow and oxygen levels in different parts of your heart. For further information please visit [email or web address]. Please follow instruction sheet, as given.      [place] AT [place] [address] Dept: [phone] Loc: [phone]  Meredith Butler  12/28/2019  You are scheduled for a Cardiac Catheterization on Tuesday, February 16 with Dr. Peter Martinique.  1. Please arrive at the [facility] (Main Entrance A) at [facility]: [address] [place], [place] [address] at 5:30 AM (This time is two hours before your procedure to ensure your preparation). Free valet parking service is available.     Special note: Every effort is made to have your procedure done on time. Please understand that emergencies sometimes delay scheduled procedures.  2. Diet: Do not eat solid foods after midnight.  The patient may have clear liquids until 5am upon the day of the procedure.  3. Labs: You will need to have blood drawn today. 4. Medication instructions in preparation for your procedure:   Contrast Allergy: No   *For reference purposes while preparing patient instructions.   Delete this med list prior to printing instructions for patient.*  Stop taking your Lisinopril, Lisinopril/hydrochlorthiazide the day of tour procedure.  Do not take Diabetes Med Glucophage (Metformin) on the day of the procedure and HOLD 48 HOURS AFTER THE PROCEDURE.  On the morning of your procedure, take your Aspirin and any morning medicines NOT listed above.  You may use sips of water.  5. Plan for one night stay--bring personal belongings. 6. Bring a current list of your medications and current insurance cards. 7. You MUST have a responsible person to drive you home. 8. Someone MUST be with you the first 24 hours after you arrive home or your discharge will be delayed. 9. Please wear clothes that are easy to get on and off and wear slip-on shoes.  Thank you for allowing [place] to care for you!   -- [facility]      Follow-Up: At [facility], you and your health needs are our priority.  As part of our continuing mission to provide you with exceptional heart care, we have created designated Provider Care Teams.  These Care Teams include your primary Cardiologist (physician) and Advanced Practice Providers (APPs -  Physician Assistants and Nurse Practitioners) who all work together to provide you with the care you need, when you need it.  Your next appointment:   1 month(s)  The format for your next appointment:   In Person  Provider:   Jyl Heinz, MD  Other  Instructions NA

## 2019-12-28 NOTE — H&P (View-Only) (Signed)
Cardiology Office Note:    Date:  12/28/2019   ID:  [name], DOB Mar 15, 1956, MRN [id number]  PCP:  Volney American, PA-C  Cardiologist:  Jenean Lindau, MD   Referring MD: Carnella Guadalajara I, NP    ASSESSMENT:    1. Essential hypertension, benign   2. Hypercholesteremia   3. Type 2 diabetes mellitus without complication, without long-term current use of insulin ([place])   4. Angina pectoris ([place])   5. Ex-smoker   6. Overweight    PLAN:    In order of problems listed above:  1. Angina pectoris: Patient symptoms are very concerning.  The following recommendations were made to the patient.  She was advised to take a coated baby aspirin on a daily basis.  Sublingual nitroglycerin prescription was sent, its protocol and 911 protocol explained and the patient vocalized understanding questions were answered to the patient's satisfaction.In view of the patient's symptoms, I discussed with the patient options for evaluation. Invasive and noninvasive options were given to the patient. I discussed stress testing and coronary angiography and left heart catheterization at length. Benefits, pros and cons of each approach were discussed at length. Patient had multiple questions which were answered to the patient's satisfaction. Patient opted for invasive evaluation and we will set up for coronary angiography and left heart catheterization. Further recommendations will be made based on the findings with coronary angiography. In the interim if the patient has any significant symptoms in hospital to the nearest emergency room. 2. Essential hypertension: Blood pressure is stable.  She will monitor on a regular basis. 3. Mixed dyslipidemia and diabetes mellitus: Diet was emphasized.  Her medications for this are managed by her primary care physician.  Weight reduction was stressed and risks of obesity explained and she vocalized understanding. 4. She will be seen in follow-up appointment after coronary  angiography.  Patient had multiple questions which were answered to her satisfaction.   Medication Adjustments/Labs and Tests Ordered: Current medicines are reviewed at length with the patient today.  Concerns regarding medicines are outlined above.  No orders of the defined types were placed in this encounter.  No orders of the defined types were placed in this encounter.    History of Present Illness:    Meredith Butler is a 64 y.o. female who is being seen today for the evaluation of chest tightness at the request of Carnella Guadalajara I, NP.  Patient is a pleasant 64 year old female.  She has past medical history of essential hypertension, dyslipidemia and diabetes mellitus.  She is overweight.  She does not exercise on a regular basis.  She was a smoker but quit about a decade ago.  She mentions to me that consistently she has chest tightness when she is stressed out.  This goes to the neck into the arms.  This has caused concern to her.  When she rests she feels better.  She uses aspirin when this happens.  She has never used nitroglycerin.  At the time of my evaluation, the patient is alert awake oriented and in no distress.  Past Medical History:  Diagnosis Date  . Asthma   . Diabetes mellitus without complication ([place])   . Fibromyalgia   . Hypertension   . Lupus ([place])   . Peritonitis [facility])     Past Surgical History:  Procedure Laterality Date  . ABDOMINAL HYSTERECTOMY     complete  . ABDOMINAL SURGERY    . APPENDECTOMY    . CHOLECYSTECTOMY    .  COLON SURGERY    . JOINT REPLACEMENT Bilateral    knee  . SMALL INTESTINE SURGERY    . SPLENECTOMY, TOTAL    . TONSILLECTOMY      Current Medications: Current Meds  Medication Sig  . albuterol (VENTOLIN HFA) 108 (90 Base) MCG/ACT inhaler Inhale 2 puffs into the lungs every 6 (six) hours as needed for wheezing or shortness of breath.  . cetirizine (ZYRTEC) 10 MG tablet Take 1 tablet (10 mg total) by mouth daily.  . Cholecalciferol  (VITAMIN D3 PO) Take 1 Dose by mouth daily.  . cyclobenzaprine (FLEXERIL) 10 MG tablet Take 1 tablet (10 mg total) by mouth 3 (three) times daily as needed for muscle spasms.  Marland Kitchen EPINEPHrine (EPIPEN 2-PAK) 0.3 mg/0.3 mL IJ SOAJ injection Inject 0.3 mLs (0.3 mg total) into the muscle as needed for anaphylaxis.  Marland Kitchen ezetimibe (ZETIA) 10 MG tablet TAKE 1 TABLET BY MOUTH EVERY DAY  . glucose blood test strip Use as instructed  . lisinopril (ZESTRIL) 10 MG tablet Take 1 tablet (10 mg total) by mouth daily.  Marland Kitchen lisinopril-hydrochlorothiazide (ZESTORETIC) 20-25 MG tablet TAKE 1 TABLET BY MOUTH EVERY DAY  . Magnesium 250 MG TABS Take 250 mg by mouth daily as needed.  . metFORMIN (GLUCOPHAGE) 1000 MG tablet Take 1 tablet (1,000 mg total) by mouth 2 (two) times daily with a meal.  . Multiple Vitamins-Minerals (ZINC PO) Take 1 tablet by mouth once a week. Takes once on Monday  . umeclidinium-vilanterol (ANORO ELLIPTA) 62.5-25 MCG/INH AEPB Inhale 1 puff into the lungs daily.     Allergies:   Claforan [cefotaxime], Ibuprofen, Omnicef [cefdinir], Penicillins, Sulfa antibiotics, Tetanus toxoids, Voltaren [diclofenac sodium], Codeine, Diovan [valsartan], Rocephin [ceftriaxone], and Statins   Social History   Socioeconomic History  . Marital status: Married    Spouse name: Not on file  . Number of children: Not on file  . Years of education: Not on file  . Highest education level: Not on file  Occupational History  . Not on file  Tobacco Use  . Smoking status: Former Smoker    Packs/day: 2.00    Years: 30.00    Pack years: 60.00    Types: Cigarettes    Quit date: 11/20/2009    Years since quitting: 10.1  . Smokeless tobacco: Never Used  Substance and Sexual Activity  . Alcohol use: No  . Drug use: No  . Sexual activity: Yes  Other Topics Concern  . Not on file  Social History Narrative  . Not on file   Social Determinants of Health   Financial Resource Strain:   . Difficulty of Paying Living  Expenses: Not on file  Food Insecurity:   . Worried About Charity fundraiser in the Last Year: Not on file  . Ran Out of Food in the Last Year: Not on file  Transportation Needs:   . Lack of Transportation (Medical): Not on file  . Lack of Transportation (Non-Medical): Not on file  Physical Activity:   . Days of Exercise per Week: Not on file  . Minutes of Exercise per Session: Not on file  Stress:   . Feeling of Stress : Not on file  Social Connections:   . Frequency of Communication with Friends and Family: Not on file  . Frequency of Social Gatherings with Friends and Family: Not on file  . Attends Religious Services: Not on file  . Active Member of Clubs or Organizations: Not on file  . Attends Club  or Organization Meetings: Not on file  . Marital Status: Not on file     Family History: The patient's family history includes Cancer in her maternal grandmother, mother, paternal grandfather, and sister; Diabetes in her daughter, daughter, maternal aunt, maternal uncle, mother, and sister; Heart attack in her father and mother.  ROS:   Please see the history of present illness.    All other systems reviewed and are negative.  EKGs/Labs/Other Studies Reviewed:    The following studies were reviewed today: EKG reveals sinus rhythm and nonspecific ST-T changes.   Recent Labs: 07/15/2019: ALT 30; TSH 2.320 12/24/2019: BUN 14; Creatinine, Ser 0.93; Hemoglobin WILL FOLLOW; Platelets WILL FOLLOW; Potassium 4.0; Sodium 142  Recent Lipid Panel    Component Value Date/Time   CHOL 206 (H) 07/15/2019 1424   TRIG 152 (H) 07/15/2019 1424   HDL 50 07/15/2019 1424   LDLCALC 126 (H) 07/15/2019 1424    Physical Exam:    VS:  BP (!) 134/94   Pulse 79   Ht 5\' 8"  (1.727 m)   Wt 239 lb (108.4 kg)   SpO2 95%   BMI 36.34 kg/m     Wt Readings from Last 3 Encounters:  12/28/19 239 lb (108.4 kg)  12/24/19 238 lb (108 kg)  10/12/19 237 lb (107.5 kg)     GEN: Patient is in no acute  distress HEENT: Normal NECK: No JVD; No carotid bruits LYMPHATICS: No lymphadenopathy CARDIAC: S1 S2 regular, 2/6 systolic murmur at the apex. RESPIRATORY:  Clear to auscultation without rales, wheezing or rhonchi  ABDOMEN: Soft, non-tender, non-distended MUSCULOSKELETAL:  No edema; No deformity  SKIN: Warm and dry NEUROLOGIC:  Alert and oriented x 3 PSYCHIATRIC:  Normal affect    Signed, Jenean Lindau, MD  12/28/2019 2:11 PM    [facility]

## 2019-12-28 NOTE — Telephone Encounter (Signed)
Fax from pharmacy. States patient was previously on statin therapy but has not filled in the last 180 days. Would like to know if statin therapy is appropriate. Pharmacy request if so, to send in a Mura prescription. Routing to provider to advise.

## 2019-12-28 NOTE — Addendum Note (Signed)
Addended by: Truddie Hidden on: 12/28/2019 02:41 PM   Modules accepted: Orders

## 2019-12-29 ENCOUNTER — Other Ambulatory Visit (INDEPENDENT_AMBULATORY_CARE_PROVIDER_SITE_OTHER): Payer: BC Managed Care – PPO

## 2019-12-29 DIAGNOSIS — I700 Atherosclerosis of aorta: Secondary | ICD-10-CM

## 2019-12-29 DIAGNOSIS — E119 Type 2 diabetes mellitus without complications: Secondary | ICD-10-CM | POA: Diagnosis not present

## 2019-12-29 DIAGNOSIS — I209 Angina pectoris, unspecified: Secondary | ICD-10-CM | POA: Diagnosis not present

## 2019-12-29 DIAGNOSIS — I10 Essential (primary) hypertension: Secondary | ICD-10-CM

## 2019-12-29 DIAGNOSIS — E7800 Pure hypercholesterolemia, unspecified: Secondary | ICD-10-CM

## 2019-12-29 LAB — CBC WITH DIFFERENTIAL/PLATELET
Basophils Absolute: 0.1 10*3/uL (ref 0.0–0.2)
Basos: 1 %
EOS (ABSOLUTE): 0.2 10*3/uL (ref 0.0–0.4)
Eos: 3 %
Hematocrit: 44.2 % (ref 34.0–46.6)
Hemoglobin: 14.9 g/dL (ref 11.1–15.9)
Immature Grans (Abs): 0 10*3/uL (ref 0.0–0.1)
Immature Granulocytes: 0 %
Lymphocytes Absolute: 1.6 10*3/uL (ref 0.7–3.1)
Lymphs: 24 %
MCH: 30.3 pg (ref 26.6–33.0)
MCHC: 33.7 g/dL (ref 31.5–35.7)
MCV: 90 fL (ref 79–97)
Monocytes Absolute: 0.6 10*3/uL (ref 0.1–0.9)
Monocytes: 9 %
Neutrophils Absolute: 4.3 10*3/uL (ref 1.4–7.0)
Neutrophils: 63 %
Platelets: 329 10*3/uL (ref 150–450)
RBC: 4.91 x10E6/uL (ref 3.77–5.28)
RDW: 12.5 % (ref 11.7–15.4)
WBC: 6.7 10*3/uL (ref 3.4–10.8)

## 2019-12-29 LAB — BASIC METABOLIC PANEL
BUN/Creatinine Ratio: 20 (ref 12–28)
BUN: 17 mg/dL (ref 8–27)
CO2: 25 mmol/L (ref 20–29)
Calcium: 10.2 mg/dL (ref 8.7–10.3)
Chloride: 99 mmol/L (ref 96–106)
Creatinine, Ser: 0.83 mg/dL (ref 0.57–1.00)
GFR calc Af Amer: 87 mL/min/{1.73_m2} (ref >59)
GFR calc non Af Amer: 75 mL/min/{1.73_m2} (ref >59)
Glucose: 83 mg/dL (ref 65–99)
Potassium: 4.9 mmol/L (ref 3.5–5.2)
Sodium: 141 mmol/L (ref 134–144)

## 2019-12-30 ENCOUNTER — Telehealth: Payer: Self-pay

## 2019-12-30 NOTE — Telephone Encounter (Signed)
Copied from [place] [id number]. Topic: General - Inquiry >> Dec 30, 2019  3:25 PM Richardo Priest, NT wrote: Reason for CRM: Pt called in stating she was instructed to call when she took her BP medications and to notify PCP if BP dropped. Pt reports that BP is 114/56 and she took the Vespa medication at 10:30. Please advise.   Routing to provider to advise.

## 2019-12-31 ENCOUNTER — Encounter: Payer: Self-pay | Admitting: Cardiology

## 2019-12-31 ENCOUNTER — Telehealth: Payer: Self-pay | Admitting: Nurse Practitioner

## 2019-12-31 NOTE — Telephone Encounter (Signed)
I called and talked with patient - see previous note.

## 2019-12-31 NOTE — Telephone Encounter (Signed)
I called and talked with the patient about her blood pressure.  She started taking the extra 10mg  of Lisinopril on 02/05.  Her blood pressure 02/10 was 104/68 and today was 114/58.  She is not having any vision changes but is feeling a little dizzy.  Of note, she saw the Cardiologist on 02/08 and he started her on Imdur.  I advised her to stop taking the extra 10mg  of Lisinopril but to continue taking the Imdur per Cardiology and continue checking her blood pressures.  Patient to call clinic with any further concerns.

## 2019-12-31 NOTE — Telephone Encounter (Signed)
Looks like Meredith Butler saw her last

## 2020-01-01 ENCOUNTER — Telehealth: Payer: Self-pay | Admitting: Cardiology

## 2020-01-01 NOTE — Telephone Encounter (Signed)
Spoke with patient. Patient reports she starting taking IMDUR on Wednesday and since then she has had a terrible headache, vomiting and lethargy.   Patient reports she called her PCP yesterday about her BP readings and they stopped her Lisinopril. That did not affect her symptoms.   Patient's blood pressures have been 118/90, 115/87, 113/54 and today it is 104/68.   Advised patient I will send information to MD and primary nurse for input.

## 2020-01-01 NOTE — Telephone Encounter (Signed)
If she does not feel better she knows to go to the emergency room.  Or urgent care.

## 2020-01-01 NOTE — Telephone Encounter (Signed)
She needs to continue lisinopril and stop the isosorbide and let us know Monday how she is feeling.

## 2020-01-01 NOTE — Telephone Encounter (Signed)
Called and spoke with the patient regarding Dr. Julien Nordmann recommendations.  Pt verbalized understanding to stop Imdur and continue the Lisinopril.

## 2020-01-01 NOTE — Telephone Encounter (Signed)
Pt c/o medication issue:  1. Name of Medication:   isosorbide mononitrate (IMDUR) 60 MG     2. How are you currently taking this medication (dosage and times per day)? 1 tablet (60 mg total) by mouth daily  3. Are you having a reaction (difficulty breathing--STAT)? No  4. What is your medication issue? Patient states the medication is causing her to experience severe headaches and low BP.

## 2020-01-02 ENCOUNTER — Other Ambulatory Visit (HOSPITAL_COMMUNITY)
Admission: RE | Admit: 2020-01-02 | Discharge: 2020-01-02 | Disposition: A | Discharge status: Home / Self Care | Payer: BC Managed Care – PPO | Source: Ambulatory Visit | Attending: Cardiology | Admitting: Cardiology

## 2020-01-02 DIAGNOSIS — Z20822 Contact with and (suspected) exposure to covid-19: Secondary | ICD-10-CM | POA: Insufficient documentation

## 2020-01-02 DIAGNOSIS — Z01812 Encounter for preprocedural laboratory examination: Secondary | ICD-10-CM | POA: Insufficient documentation

## 2020-01-02 LAB — SARS CORONAVIRUS 2 (TAT 6-24 HRS): SARS Coronavirus 2: NEGATIVE

## 2020-01-04 ENCOUNTER — Telehealth: Payer: Self-pay | Admitting: *Deleted

## 2020-01-04 NOTE — Telephone Encounter (Signed)
Voicemail message. 

## 2020-01-04 NOTE — Telephone Encounter (Addendum)
Pt contacted pre-catheterization scheduled at [facility] for: Tuesday January 05, 2020 7:30 AM Verified arrival time and place: [facility]) at: 5:30 AM   No solid food after midnight prior to cath, clear liquids until 5 AM day of procedure. Contrast allergy: no  Hold: Lisinopril-HCT-AM of procedure. Metformin-day of procedure and 48 hours post procedure.   Except hold medications AM meds can be  taken pre-cath with sip of water including: ASA 81 mg   Confirmed patient has responsible adult to drive home post procedure and observe 24 hours after arriving home: yes  Currently, due to Covid-19 pandemic, only one person will be allowed with patient. Must be the same person for patient's entire stay and will be required to wear a mask. They will be asked to wait in the waiting room for the duration of the patient's stay.  Patients are required to wear a mask when they enter the hospital.      COVID-19 Pre-Screening Questions:  . In the past 7 to 10 days have you had a cough,  shortness of breath, headache, congestion, fever (100 or greater) body aches, chills, sore throat, or sudden loss of taste or sense of smell? no . Have you been around anyone with known Covid 19 in the past 7-10 days? no . Have you been around anyone who is awaiting Covid 19 test results in the past 7 to 10 days? no . Have you been around anyone who has been exposed to Covid 19, or has mentioned symptoms of Covid 19 within the past 7 to 10 days? no   I reviewed procedure/mask/visitor instructions, COVID-19 screening questions with patient, she verbalized understanding, thanked me for call.

## 2020-01-05 ENCOUNTER — Encounter (HOSPITAL_COMMUNITY)
Admission: RE | Disposition: A | Discharge status: Home / Self Care | Payer: Self-pay | Source: Home / Self Care | Attending: Cardiology

## 2020-01-05 ENCOUNTER — Ambulatory Visit (HOSPITAL_COMMUNITY)
Admission: RE | Admit: 2020-01-05 | Discharge: 2020-01-05 | Disposition: A | Discharge status: Home / Self Care | Payer: BC Managed Care – PPO | Attending: Cardiology | Admitting: Cardiology

## 2020-01-05 ENCOUNTER — Other Ambulatory Visit: Payer: Self-pay

## 2020-01-05 ENCOUNTER — Encounter: Payer: Self-pay | Admitting: *Deleted

## 2020-01-05 DIAGNOSIS — Z87891 Personal history of nicotine dependence: Secondary | ICD-10-CM | POA: Diagnosis not present

## 2020-01-05 DIAGNOSIS — M797 Fibromyalgia: Secondary | ICD-10-CM | POA: Insufficient documentation

## 2020-01-05 DIAGNOSIS — Z006 Encounter for examination for normal comparison and control in clinical research program: Secondary | ICD-10-CM

## 2020-01-05 DIAGNOSIS — R079 Chest pain, unspecified: Secondary | ICD-10-CM | POA: Diagnosis not present

## 2020-01-05 DIAGNOSIS — E663 Overweight: Secondary | ICD-10-CM | POA: Insufficient documentation

## 2020-01-05 DIAGNOSIS — Z79899 Other long term (current) drug therapy: Secondary | ICD-10-CM | POA: Diagnosis not present

## 2020-01-05 DIAGNOSIS — I209 Angina pectoris, unspecified: Secondary | ICD-10-CM | POA: Diagnosis present

## 2020-01-05 DIAGNOSIS — Z885 Allergy status to narcotic agent status: Secondary | ICD-10-CM | POA: Diagnosis not present

## 2020-01-05 DIAGNOSIS — Z888 Allergy status to other drugs, medicaments and biological substances status: Secondary | ICD-10-CM | POA: Insufficient documentation

## 2020-01-05 DIAGNOSIS — J45909 Unspecified asthma, uncomplicated: Secondary | ICD-10-CM | POA: Insufficient documentation

## 2020-01-05 DIAGNOSIS — Z882 Allergy status to sulfonamides status: Secondary | ICD-10-CM | POA: Insufficient documentation

## 2020-01-05 DIAGNOSIS — I10 Essential (primary) hypertension: Secondary | ICD-10-CM | POA: Diagnosis present

## 2020-01-05 DIAGNOSIS — E7800 Pure hypercholesterolemia, unspecified: Secondary | ICD-10-CM | POA: Diagnosis present

## 2020-01-05 DIAGNOSIS — Z8249 Family history of ischemic heart disease and other diseases of the circulatory system: Secondary | ICD-10-CM | POA: Diagnosis not present

## 2020-01-05 DIAGNOSIS — Z886 Allergy status to analgesic agent status: Secondary | ICD-10-CM | POA: Insufficient documentation

## 2020-01-05 DIAGNOSIS — E1169 Type 2 diabetes mellitus with other specified complication: Secondary | ICD-10-CM | POA: Diagnosis present

## 2020-01-05 DIAGNOSIS — E782 Mixed hyperlipidemia: Secondary | ICD-10-CM | POA: Diagnosis not present

## 2020-01-05 DIAGNOSIS — Z6835 Body mass index (BMI) 35.0-35.9, adult: Secondary | ICD-10-CM | POA: Insufficient documentation

## 2020-01-05 DIAGNOSIS — Z881 Allergy status to other antibiotic agents status: Secondary | ICD-10-CM | POA: Diagnosis not present

## 2020-01-05 DIAGNOSIS — Z880 Allergy status to penicillin: Secondary | ICD-10-CM | POA: Insufficient documentation

## 2020-01-05 DIAGNOSIS — Z7984 Long term (current) use of oral hypoglycemic drugs: Secondary | ICD-10-CM | POA: Insufficient documentation

## 2020-01-05 DIAGNOSIS — E119 Type 2 diabetes mellitus without complications: Secondary | ICD-10-CM | POA: Insufficient documentation

## 2020-01-05 DIAGNOSIS — M329 Systemic lupus erythematosus, unspecified: Secondary | ICD-10-CM | POA: Insufficient documentation

## 2020-01-05 DIAGNOSIS — E785 Hyperlipidemia, unspecified: Secondary | ICD-10-CM | POA: Diagnosis present

## 2020-01-05 HISTORY — PX: LEFT HEART CATH AND CORONARY ANGIOGRAPHY: CATH118249

## 2020-01-05 LAB — GLUCOSE, CAPILLARY: Glucose-Capillary: 150 mg/dL — ABNORMAL HIGH (ref 70–99)

## 2020-01-05 SURGERY — LEFT HEART CATH AND CORONARY ANGIOGRAPHY
Anesthesia: LOCAL

## 2020-01-05 MED ORDER — MIDAZOLAM HCL 2 MG/2ML IJ SOLN
INTRAMUSCULAR | Status: AC
  Filled 2020-01-05: qty 2

## 2020-01-05 MED ORDER — LIDOCAINE HCL (PF) 1 % IJ SOLN
INTRAMUSCULAR | Status: AC
  Filled 2020-01-05: qty 30

## 2020-01-05 MED ORDER — HEPARIN (PORCINE) IN NACL 1000-0.9 UT/500ML-% IV SOLN
INTRAVENOUS | Status: AC
  Filled 2020-01-05: qty 500

## 2020-01-05 MED ORDER — VERAPAMIL HCL 2.5 MG/ML IV SOLN
INTRAVENOUS | Status: AC
  Filled 2020-01-05: qty 2

## 2020-01-05 MED ORDER — ASPIRIN 81 MG PO CHEW: 81.0000 mg | CHEWABLE_TABLET | ORAL | Status: DC

## 2020-01-05 MED ORDER — HEPARIN SODIUM (PORCINE) 1000 UNIT/ML IJ SOLN
INTRAMUSCULAR | Status: AC
  Filled 2020-01-05: qty 1

## 2020-01-05 MED ORDER — ONDANSETRON HCL 4 MG/2ML IJ SOLN: 4.0000 mg | Freq: Four times a day (QID) | INTRAMUSCULAR | Status: DC | PRN

## 2020-01-05 MED ORDER — METFORMIN HCL 1000 MG PO TABS: 1000.0000 mg | ORAL_TABLET | Freq: Two times a day (BID) | ORAL | 0 refills | Status: DC

## 2020-01-05 MED ORDER — ACETAMINOPHEN 325 MG PO TABS
650.0000 mg | ORAL_TABLET | ORAL | Status: DC | PRN
  Administered 2020-01-05: 650 mg via ORAL
  Filled 2020-01-05: qty 2

## 2020-01-05 MED ORDER — FENTANYL CITRATE (PF) 100 MCG/2ML IJ SOLN
INTRAMUSCULAR | Status: AC
  Filled 2020-01-05: qty 2

## 2020-01-05 MED ORDER — SODIUM CHLORIDE 0.9% FLUSH: 3.0000 mL | Freq: Two times a day (BID) | INTRAVENOUS | Status: DC

## 2020-01-05 MED ORDER — MIDAZOLAM HCL 2 MG/2ML IJ SOLN
INTRAMUSCULAR | Status: DC | PRN
  Administered 2020-01-05: 1 mg via INTRAVENOUS

## 2020-01-05 MED ORDER — FENTANYL CITRATE (PF) 100 MCG/2ML IJ SOLN
INTRAMUSCULAR | Status: DC | PRN
  Administered 2020-01-05: 25 ug via INTRAVENOUS

## 2020-01-05 MED ORDER — IOHEXOL 350 MG/ML SOLN
INTRAVENOUS | Status: DC | PRN
  Administered 2020-01-05: 55 mL via INTRA_ARTERIAL

## 2020-01-05 MED ORDER — HEPARIN (PORCINE) IN NACL 1000-0.9 UT/500ML-% IV SOLN
INTRAVENOUS | Status: DC | PRN
  Administered 2020-01-05: 500 mL

## 2020-01-05 MED ORDER — HEPARIN SODIUM (PORCINE) 1000 UNIT/ML IJ SOLN
INTRAMUSCULAR | Status: DC | PRN
  Administered 2020-01-05: 5500 [IU] via INTRAVENOUS

## 2020-01-05 MED ORDER — VERAPAMIL HCL 2.5 MG/ML IV SOLN
INTRAVENOUS | Status: DC | PRN
  Administered 2020-01-05: 10 mL via INTRA_ARTERIAL

## 2020-01-05 MED ORDER — SODIUM CHLORIDE 0.9% FLUSH: 3.0000 mL | INTRAVENOUS | Status: DC | PRN

## 2020-01-05 MED ORDER — SODIUM CHLORIDE 0.9 % WEIGHT BASED INFUSION: 1.0000 mL/kg/h | INTRAVENOUS | Status: DC

## 2020-01-05 MED ORDER — SODIUM CHLORIDE 0.9 % IV SOLN: 250.0000 mL | INTRAVENOUS | Status: DC | PRN

## 2020-01-05 MED ORDER — SODIUM CHLORIDE 0.9 % WEIGHT BASED INFUSION
3.0000 mL/kg/h | INTRAVENOUS | Status: AC
  Administered 2020-01-05: 3 mL/kg/h via INTRAVENOUS

## 2020-01-05 MED ORDER — LIDOCAINE HCL (PF) 1 % IJ SOLN
INTRAMUSCULAR | Status: DC | PRN
  Administered 2020-01-05: 2 mL

## 2020-01-05 SURGICAL SUPPLY — 12 items
CATH 5FR JL3.5 JR4 ANG PIG MP (CATHETERS) ×2 IMPLANT
DEVICE RAD COMP TR BAND LRG (VASCULAR PRODUCTS) ×2 IMPLANT
GLIDESHEATH SLEND SS 6F .021 (SHEATH) ×2 IMPLANT
GUIDEWIRE INQWIRE 1.5J.035X260 (WIRE) ×1 IMPLANT
INQWIRE 1.5J .035X260CM (WIRE) ×2
KIT HEART LEFT (KITS) ×2 IMPLANT
PACK CARDIAC CATHETERIZATION (CUSTOM PROCEDURE TRAY) ×2 IMPLANT
SHEATH PROBE COVER 6X72 (BAG) ×2 IMPLANT
SYR MEDRAD MARK 7 150ML (SYRINGE) ×2 IMPLANT
TRANSDUCER W/STOPCOCK (MISCELLANEOUS) ×2 IMPLANT
TUBING CIL FLEX 10 FLL-RA (TUBING) ×2 IMPLANT
WIRE HI TORQ VERSACORE-J 145CM (WIRE) ×2 IMPLANT

## 2020-01-05 NOTE — Interval H&P Note (Signed)
History and Physical Interval Note:  01/05/2020 7:24 AM  Meredith Butler  has presented today for surgery, with the diagnosis of angina.  The various methods of treatment have been discussed with the patient and family. After consideration of risks, benefits and other options for treatment, the patient has consented to  Procedure(s): LEFT HEART CATH AND CORONARY ANGIOGRAPHY (N/A) as a surgical intervention.  The patient's history has been reviewed, patient examined, no change in status, stable for surgery.  I have reviewed the patient's chart and labs.  Questions were answered to the patient's satisfaction.    Cath Lab Visit (complete for each Cath Lab visit)  Clinical Evaluation Leading to the Procedure:   ACS: No.  Non-ACS:    Anginal Classification: CCS II  Anti-ischemic medical therapy: No Therapy  Non-Invasive Test Results: No non-invasive testing performed  Prior CABG: No previous CABG       Collier Salina [facility] 01/05/2020 7:24 AM

## 2020-01-05 NOTE — Research (Signed)
CADFEM Informed Consent                  Subject Name:   Meredith Butler   Subject met inclusion and exclusion criteria.  The informed consent form, study requirements and expectations were reviewed with the subject and questions and concerns were addressed prior to the signing of the consent form.  The subject verbalized understanding of the trial requirements.  The subject agreed to participate in the CADFEM trial and signed the informed consent.  The informed consent was obtained prior to performance of any protocol-specific procedures for the subject.  A copy of the signed informed consent was given to the subject and a copy was placed in the subject's medical record. This patient was consented by Philemon Kingdom.   [place] Chalmers, Research Assistant  01/05/2020  06:35

## 2020-01-05 NOTE — Discharge Instructions (Signed)
Radial Site Care  This sheet gives you information about how to care for yourself after your procedure. Your health care provider may also give you more specific instructions. If you have problems or questions, contact your health care provider. What can I expect after the procedure? After the procedure, it is common to have:  Bruising and tenderness at the catheter insertion area. Follow these instructions at home: Medicines  Take over-the-counter and prescription medicines only as told by your health care provider. Insertion site care  Follow instructions from your health care provider about how to take care of your insertion site. Make sure you: ? Wash your hands with soap and water before you change your bandage (dressing). If soap and water are not available, use hand sanitizer. ? Change your dressing as told by your health care provider. ? Leave stitches (sutures), skin glue, or adhesive strips in place. These skin closures may need to stay in place for 2 weeks or longer. If adhesive strip edges start to loosen and curl up, you may trim the loose edges. Do not remove adhesive strips completely unless your health care provider tells you to do that.  Check your insertion site every day for signs of infection. Check for: ? Redness, swelling, or pain. ? Fluid or blood. ? Pus or a bad smell. ? Warmth.  Do not take baths, swim, or use a hot tub until your health care provider approves.  You may shower 24-48 hours after the procedure, or as directed by your health care provider. ? Remove the dressing and gently wash the site with plain soap and water. ? Pat the area dry with a clean towel. ? Do not rub the site. That could cause bleeding.  Do not apply powder or lotion to the site. Activity   For 24 hours after the procedure, or as directed by your health care provider: ? Do not flex or bend the affected arm. ? Do not push or pull heavy objects with the affected arm. ? Do not  drive yourself home from the hospital or clinic. You may drive 24 hours after the procedure unless your health care provider tells you not to. ? Do not operate machinery or power tools.  Do not lift anything that is heavier than 10 lb (4.5 kg), or the limit that you are told, until your health care provider says that it is safe.  Ask your health care provider when it is okay to: ? Return to work or school. ? Resume usual physical activities or sports. ? Resume sexual activity. General instructions  If the catheter site starts to bleed, raise your arm and put firm pressure on the site. If the bleeding does not stop, get help right away. This is a medical emergency.  If you went home on the same day as your procedure, a responsible adult should be with you for the first 24 hours after you arrive home.  Keep all follow-up visits as told by your health care provider. This is important. Contact a health care provider if:  You have a fever.  You have redness, swelling, or yellow drainage around your insertion site. Get help right away if:  You have unusual pain at the radial site.  The catheter insertion area swells very fast.  The insertion area is bleeding, and the bleeding does not stop when you hold steady pressure on the area.  Your arm or hand becomes pale, cool, tingly, or numb. These symptoms may represent a serious problem   that is an emergency. Do not wait to see if the symptoms will go away. Get medical help right away. Call your local emergency services (911 in the [place] Do not drive yourself to the hospital. Summary  After the procedure, it is common to have bruising and tenderness at the site.  Follow instructions from your health care provider about how to take care of your radial site wound. Check the wound every day for signs of infection.  Do not lift anything that is heavier than 10 lb (4.5 kg), or the limit that you are told, until your health care provider says  that it is safe. This information is not intended to replace advice given to you by your health care provider. Make sure you discuss any questions you have with your health care provider. Document Revised: 12/11/2017 Document Reviewed: 12/11/2017 Elsevier Patient Education  2020 Elsevier Inc.  

## 2020-01-13 ENCOUNTER — Other Ambulatory Visit: Payer: Self-pay | Admitting: Family Medicine

## 2020-01-13 ENCOUNTER — Ambulatory Visit: Payer: BC Managed Care – PPO | Admitting: Family Medicine

## 2020-01-21 ENCOUNTER — Encounter: Payer: Self-pay | Admitting: Nurse Practitioner

## 2020-01-21 ENCOUNTER — Ambulatory Visit (INDEPENDENT_AMBULATORY_CARE_PROVIDER_SITE_OTHER): Payer: BC Managed Care – PPO | Admitting: Nurse Practitioner

## 2020-01-21 ENCOUNTER — Other Ambulatory Visit: Payer: Self-pay

## 2020-01-21 VITALS — BP 109/75 | HR 79 | Temp 98.0°F | Ht 68.0 in | Wt 236.6 lb

## 2020-01-21 DIAGNOSIS — J309 Allergic rhinitis, unspecified: Secondary | ICD-10-CM | POA: Insufficient documentation

## 2020-01-21 DIAGNOSIS — I10 Essential (primary) hypertension: Secondary | ICD-10-CM | POA: Diagnosis not present

## 2020-01-21 DIAGNOSIS — F339 Major depressive disorder, recurrent, unspecified: Secondary | ICD-10-CM | POA: Diagnosis not present

## 2020-01-21 DIAGNOSIS — J301 Allergic rhinitis due to pollen: Secondary | ICD-10-CM | POA: Diagnosis not present

## 2020-01-21 MED ORDER — SERTRALINE HCL 25 MG PO TABS: 25.0000 mg | ORAL_TABLET | Freq: Every day | ORAL | 0 refills | Status: DC

## 2020-01-21 MED ORDER — LISINOPRIL 10 MG PO TABS: 10.0000 mg | ORAL_TABLET | Freq: Every day | ORAL | 0 refills | Status: DC

## 2020-01-21 NOTE — Assessment & Plan Note (Signed)
Acute, ongoing.  PHQ-9 elevated today, declines counseling for now.  Will start Zoloft 25mg  daily.  Advised on long half life of medication and may not feel difference for up to 2 weeks, verbalized understanding.  Will follow up on mood in 4 weeks.  Patient to call or return to clinic in meantime with concerns.

## 2020-01-21 NOTE — Patient Instructions (Addendum)
So nice seeing you again today!  Take care.  Sertraline tablets What is this medicine? SERTRALINE (SER tra leen) is used to treat depression. It may also be used to treat obsessive compulsive disorder, panic disorder, post-trauma stress, premenstrual dysphoric disorder (PMDD) or social anxiety. This medicine may be used for other purposes; ask your health care provider or pharmacist if you have questions. COMMON BRAND NAME(S): Zoloft What should I tell my health care provider before I take this medicine? They need to know if you have any of these conditions:  bleeding disorders  bipolar disorder or a family history of bipolar disorder  glaucoma  heart disease  high blood pressure  history of irregular heartbeat  history of low levels of calcium, magnesium, or potassium in the blood  if you often drink alcohol  liver disease  receiving electroconvulsive therapy  seizures  suicidal thoughts, plans, or attempt; a previous suicide attempt by you or a family member  take medicines that treat or prevent blood clots  thyroid disease  an unusual or allergic reaction to sertraline, other medicines, foods, dyes, or preservatives  pregnant or trying to get pregnant  breast-feeding How should I use this medicine? Take this medicine by mouth with a glass of water. Follow the directions on the prescription label. You can take it with or without food. Take your medicine at regular intervals. Do not take your medicine more often than directed. Do not stop taking this medicine suddenly except upon the advice of your doctor. Stopping this medicine too quickly may cause serious side effects or your condition may worsen. A special MedGuide will be given to you by the pharmacist with each prescription and refill. Be sure to read this information carefully each time. Talk to your pediatrician regarding the use of this medicine in children. While this drug may be prescribed for children as  young as 7 years for selected conditions, precautions do apply. Overdosage: If you think you have taken too much of this medicine contact a poison control center or emergency room at once. NOTE: This medicine is only for you. Do not share this medicine with others. What if I miss a dose? If you miss a dose, take it as soon as you can. If it is almost time for your next dose, take only that dose. Do not take double or extra doses. What may interact with this medicine? Do not take this medicine with any of the following medications:  cisapride  dronedarone  linezolid  MAOIs like Carbex, Eldepryl, Marplan, Nardil, and Parnate  methylene blue (injected into a vein)  pimozide  thioridazine This medicine may also interact with the following medications:  alcohol  amphetamines  aspirin and aspirin-like medicines  certain medicines for depression, anxiety, or psychotic disturbances  certain medicines for fungal infections like ketoconazole, fluconazole, posaconazole, and itraconazole  certain medicines for irregular heart beat like flecainide, quinidine, propafenone  certain medicines for migraine headaches like almotriptan, eletriptan, frovatriptan, naratriptan, rizatriptan, sumatriptan, zolmitriptan  certain medicines for sleep  certain medicines for seizures like carbamazepine, valproic acid, phenytoin  certain medicines that treat or prevent blood clots like warfarin, enoxaparin, dalteparin  cimetidine  digoxin  diuretics  fentanyl  isoniazid  lithium  NSAIDs, medicines for pain and inflammation, like ibuprofen or naproxen  other medicines that prolong the QT interval (cause an abnormal heart rhythm) like dofetilide  rasagiline  safinamide  supplements like St. John's wort, kava kava, valerian  tolbutamide  tramadol  tryptophan This list may  not describe all possible interactions. Give your health care provider a list of all the medicines, herbs,  non-prescription drugs, or dietary supplements you use. Also tell them if you smoke, drink alcohol, or use illegal drugs. Some items may interact with your medicine. What should I watch for while using this medicine? Tell your doctor if your symptoms do not get better or if they get worse. Visit your doctor or health care professional for regular checks on your progress. Because it may take several weeks to see the full effects of this medicine, it is important to continue your treatment as prescribed by your doctor. Patients and their families should watch out for [place] or worsening thoughts of suicide or depression. Also watch out for sudden changes in feelings such as feeling anxious, agitated, panicky, irritable, hostile, aggressive, impulsive, severely restless, overly excited and hyperactive, or not being able to sleep. If this happens, especially at the beginning of treatment or after a change in dose, call your health care professional. Dennis Bast may get drowsy or dizzy. Do not drive, use machinery, or do anything that needs mental alertness until you know how this medicine affects you. Do not stand or sit up quickly, especially if you are an older patient. This reduces the risk of dizzy or fainting spells. Alcohol may interfere with the effect of this medicine. Avoid alcoholic drinks. Your mouth may get dry. Chewing sugarless gum or sucking hard candy, and drinking plenty of water may help. Contact your doctor if the problem does not go away or is severe. What side effects may I notice from receiving this medicine? Side effects that you should report to your doctor or health care professional as soon as possible:  allergic reactions like skin rash, itching or hives, swelling of the face, lips, or tongue  anxious  black, tarry stools  changes in vision  confusion  elevated mood, decreased need for sleep, racing thoughts, impulsive behavior  eye pain  fast, irregular heartbeat  feeling faint  or lightheaded, falls  feeling agitated, angry, or irritable  hallucination, loss of contact with reality  loss of balance or coordination  loss of memory  painful or prolonged erections  restlessness, pacing, inability to keep still  seizures  stiff muscles  suicidal thoughts or other mood changes  trouble sleeping  unusual bleeding or bruising  unusually weak or tired  vomiting Side effects that usually do not require medical attention (report to your doctor or health care professional if they continue or are bothersome):  change in appetite or weight  change in sex drive or performance  diarrhea  increased sweating  indigestion, nausea  tremors This list may not describe all possible side effects. Call your doctor for medical advice about side effects. You may report side effects to FDA at 1-800-FDA-1088. Where should I keep my medicine? Keep out of the reach of children. Store at room temperature between 15 and 30 degrees C (59 and 86 degrees F). Throw away any unused medicine after the expiration date. NOTE: This sheet is a summary. It may not cover all possible information. If you have questions about this medicine, talk to your doctor, pharmacist, or health care provider.  2020 Elsevier/Gold Standard (2018-10-28 10:09:27)

## 2020-01-21 NOTE — Assessment & Plan Note (Signed)
Well controlled on current regimen.  Continue lisinopril-hctz at current dose.  Encouraged to monitor at home and call or return to the office with any concerns.

## 2020-01-21 NOTE — Assessment & Plan Note (Addendum)
Acute, ongoing.  Likely is cause of ear pressure and eye itchiness.  Encouraged to restart on zyrtec especially while out gardening and with flowers.  Declines nasal spray for now.

## 2020-01-21 NOTE — Progress Notes (Signed)
BP 109/75 (BP Location: Left Arm, Patient Position: Sitting, Cuff Size: Normal)   Pulse 79   Temp 98 F (36.7 C) (Oral)   Ht 5\' 8"  (1.727 m)   Wt 236 lb 9.6 oz (107.3 kg)   SpO2 97%   BMI 35.97 kg/m    Subjective:    Patient ID: Meredith Butler, female    DOB: Jul 04, 1956, 64 y.o.   MRN: [id number]  HPI: Meredith Butler is a 64 y.o. female  Chief Complaint  Patient presents with  . Hypertension  . Diabetes   HYPERTENSION Hypertension status: controlled  Satisfied with current treatment? yes Duration of hypertension: chronic BP monitoring frequency:  daily BP range: 120/70s BP medication side effects:  no Medication compliance: excellent compliance Previous BP meds: Imdur, lisinopril-hctz Aspirin: yes Recurrent headaches: no Visual changes: no Palpitations: no Dyspnea: no Chest pain: no Lower extremity edema: no Dizzy/lightheaded: no   EAR PRESSURE Duration: weeks Involved ear(s): bilateral Severity:  moderate  Quality:  pressure-like Fever: no Otorrhea: yes; yellow/red Upper respiratory infection symptoms: no Pruritus: no  Eyes: feel Hearing loss: yes Water immersion no Using Q-tips: yes; sometimes Recurrent otitis media: no Status: worse Treatments attempted: none  Of note, has not been taking Zyrtec and has been around a lot of flowers lately.  DEPRESSION Mood status: uncontrolled Satisfied with current treatment?: no Symptom severity: mild  Duration of current treatment : not currently on treatment Side effects: n/a Medication compliance: n/a  Psychotherapy/counseling: no declines Previous psychiatric medications: zoloft Depressed mood: yes Anxious mood: no Anhedonia: no Significant weight loss or gain: no Insomnia: yes hard to stay asleep Fatigue: yes Feelings of worthlessness or guilt: no Impaired concentration/indecisiveness: yes Suicidal ideations: no Hopelessness: no Crying spells: yes Depression screen [facility] 2/9 01/21/2020 07/15/2019 04/10/2019  04/23/2018 01/30/2018  Decreased Interest 2 0 1 1 0  Down, Depressed, Hopeless 1 0 0 1 0  PHQ - 2 Score 3 0 1 2 0  Altered sleeping 2 0 1 2 1   Tired, decreased energy 1 0 0 2 1  Change in appetite 0 1 1 1 1   Feeling bad or failure about yourself  1 0 0 1 0  Trouble concentrating 1 0 0 1 0  Moving slowly or fidgety/restless 0 0 0 0 0  Suicidal thoughts 0 0 0 0 0  PHQ-9 Score 8 1 3 9 3   Difficult doing work/chores Somewhat difficult - - - -   Patient states she recently lost a family member and is having a hard time coping with it.  She is wondering if she can be restarted on her Zoloft.   Allergies  Allergen Reactions  . Claforan [Cefotaxime] Anaphylaxis  . Ibuprofen Anaphylaxis  . Omnicef [Cefdinir] Anaphylaxis  . Penicillins Anaphylaxis    Has patient had a PCN reaction causing immediate rash, facial/tongue/throat swelling, SOB or lightheadedness with hypotension: Yes Has patient had a PCN reaction causing severe rash involving mucus membranes or skin necrosis: No Has patient had a PCN reaction that required hospitalization: Yes Has patient had a PCN reaction occurring within the last 10 years: Yes If all of the above answers are "NO", then may proceed with Cephalosporin use.   . Sulfa Antibiotics Anaphylaxis  . Tetanus Toxoids Anaphylaxis  . Voltaren [Diclofenac Sodium] Anaphylaxis  . Codeine     Makes her crazy   . Diovan [Valsartan] Swelling  . Rocephin [Ceftriaxone]     Eyes turn blood red   . Statins     myalgias  Patient Active Problem List   Diagnosis Date Noted  . Allergic rhinitis 01/21/2020  . Statin intolerance 12/28/2019  . Angina pectoris ([place]) 12/28/2019  . Ex-smoker 12/28/2019  . Overweight 12/28/2019  . Aortic atherosclerosis ([place]) 10/13/2018  . Depression, major, single episode, moderate ([place]) 04/23/2018  . Type 2 diabetes mellitus without complication, without long-term current use of insulin ([place]) 12/24/2017  . Essential hypertension, benign  12/24/2017  . Hypercholesteremia 12/24/2017  . Fibromyalgia 12/24/2017  . Polyarthralgia 12/24/2017  . Asthma 12/24/2017  . Elevated TSH 12/24/2017   Past Medical History:  Diagnosis Date  . Asthma   . Diabetes mellitus without complication ([place])   . Fibromyalgia   . Hypertension   . Lupus ([place])   . Peritonitis [facility])    Patient Active Problem List   Diagnosis Date Noted  . Allergic rhinitis 01/21/2020  . Statin intolerance 12/28/2019  . Angina pectoris ([place]) 12/28/2019  . Ex-smoker 12/28/2019  . Overweight 12/28/2019  . Aortic atherosclerosis ([place]) 10/13/2018  . Depression, major, single episode, moderate ([place]) 04/23/2018  . Type 2 diabetes mellitus without complication, without long-term current use of insulin ([place]) 12/24/2017  . Essential hypertension, benign 12/24/2017  . Hypercholesteremia 12/24/2017  . Fibromyalgia 12/24/2017  . Polyarthralgia 12/24/2017  . Asthma 12/24/2017  . Elevated TSH 12/24/2017   Relevant past medical, surgical, family and social history reviewed and updated as indicated. Interim medical history since our last visit reviewed.  Review of Systems  Constitutional: Positive for fatigue. Negative for activity change, appetite change, chills and fever.  HENT: Positive for ear discharge and hearing loss. Negative for ear pain, nosebleeds, postnasal drip, rhinorrhea, sinus pain, sore throat and tinnitus.   Eyes: Positive for redness and itching. Negative for visual disturbance.  Respiratory: Negative.  Negative for cough, chest tightness, shortness of breath and wheezing.   Cardiovascular: Negative.  Negative for chest pain, palpitations and leg swelling.  Musculoskeletal: Negative.   Skin: Negative.  Negative for color change and pallor.  Neurological: Negative for dizziness, weakness, light-headedness and headaches.  Psychiatric/Behavioral: Positive for sleep disturbance. Negative for confusion and decreased concentration. The patient is  nervous/anxious.      Per HPI unless specifically indicated above     Objective:    BP 109/75 (BP Location: Left Arm, Patient Position: Sitting, Cuff Size: Normal)   Pulse 79   Temp 98 F (36.7 C) (Oral)   Ht 5\' 8"  (1.727 m)   Wt 236 lb 9.6 oz (107.3 kg)   SpO2 97%   BMI 35.97 kg/m   Wt Readings from Last 3 Encounters:  01/21/20 236 lb 9.6 oz (107.3 kg)  01/05/20 235 lb (106.6 kg)  12/28/19 239 lb (108.4 kg)    Physical Exam Vitals and nursing note reviewed.  Constitutional:      General: She is not in acute distress.    Appearance: Normal appearance. She is not toxic-appearing.  HENT:     Head: Normocephalic and atraumatic.     Right Ear: External ear normal.     Left Ear: External ear normal.  Eyes:     General: No scleral icterus.    Extraocular Movements: Extraocular movements intact.  Cardiovascular:     Rate and Rhythm: Normal rate and regular rhythm.     Heart sounds: Normal heart sounds. No murmur.  Pulmonary:     Effort: Pulmonary effort is normal. No respiratory distress.  Musculoskeletal:        General: Normal range of motion.     Cervical  back: Normal range of motion. No rigidity.     Right lower leg: No edema.     Left lower leg: No edema.  Skin:    General: Skin is warm and dry.     Coloration: Skin is not jaundiced or pale.  Neurological:     General: No focal deficit present.     Mental Status: She is alert and oriented to person, place, and time.     Motor: No weakness.     Gait: Gait normal.  Psychiatric:        Mood and Affect: Mood normal. Affect is tearful.        Behavior: Behavior normal.        Thought Content: Thought content normal.        Judgment: Judgment normal.       Assessment & Plan:   Problem List Items Addressed This Visit      Cardiovascular and Mediastinum   Essential hypertension, benign - Primary    Well controlled on current regimen.  Continue lisinopril-hctz at current dose.  Encouraged to monitor at home and  call or return to the office with any concerns.      Relevant Medications   lisinopril (ZESTRIL) 10 MG tablet     Respiratory   Allergic rhinitis    Acute, ongoing.  Likely is cause of ear pressure and eye itchiness.  Encouraged to restart on zyrtec especially while out gardening and with flowers.  Declines nasal spray for now.        Other   Depression, major, single episode, moderate (HCC)    Acute, ongoing.  PHQ-9 elevated today, declines counseling for now.  Will start Zoloft 25mg  daily.  Advised on long half life of medication and may not feel difference for up to 2 weeks, verbalized understanding.  Will follow up on mood in 4 weeks.  Patient to call or return to clinic in meantime with concerns.      Relevant Medications   sertraline (ZOLOFT) 25 MG tablet       Follow up plan: Return in about 4 weeks (around 02/18/2020) for mood f/u, can be virtual.

## 2020-01-25 ENCOUNTER — Ambulatory Visit (INDEPENDENT_AMBULATORY_CARE_PROVIDER_SITE_OTHER): Payer: BC Managed Care – PPO | Admitting: Cardiology

## 2020-01-25 ENCOUNTER — Encounter: Payer: Self-pay | Admitting: Cardiology

## 2020-01-25 ENCOUNTER — Other Ambulatory Visit: Payer: Self-pay

## 2020-01-25 VITALS — BP 140/94 | HR 86 | Ht 68.0 in | Wt 238.0 lb

## 2020-01-25 DIAGNOSIS — E119 Type 2 diabetes mellitus without complications: Secondary | ICD-10-CM | POA: Diagnosis not present

## 2020-01-25 DIAGNOSIS — E663 Overweight: Secondary | ICD-10-CM

## 2020-01-25 DIAGNOSIS — E7800 Pure hypercholesterolemia, unspecified: Secondary | ICD-10-CM | POA: Diagnosis not present

## 2020-01-25 DIAGNOSIS — I700 Atherosclerosis of aorta: Secondary | ICD-10-CM

## 2020-01-25 DIAGNOSIS — I10 Essential (primary) hypertension: Secondary | ICD-10-CM

## 2020-01-25 DIAGNOSIS — Z87891 Personal history of nicotine dependence: Secondary | ICD-10-CM

## 2020-01-25 NOTE — Progress Notes (Signed)
Cardiology Office Note:    Date:  01/25/2020   ID:  Meredith Butler, DOB [birth year], MRN [id number]  PCP:  Volney American, PA-C  Cardiologist:  Jenean Lindau, MD   Referring MD: Volney American,*    ASSESSMENT:    1. Essential hypertension, benign   2. Hypercholesteremia   3. Aortic atherosclerosis ([place])   4. Type 2 diabetes mellitus without complication, without long-term current use of insulin ([place])   5. Ex-smoker   6. Overweight    PLAN:    In order of problems listed above:  1. Primary prevention stressed to the patient.  Importance of compliance with diet and medication stressed and she vocalized understanding. 2. Essential hypertension: Blood pressure is stable and she keeps track of it. 3. Mixed dyslipidemia and diabetes mellitus: Lipids followed by primary care physician.  Coronary angiography report was detailed to the patient at length and I told her to walk regularly at least 30 minutes a day 5 days a week and lose weight.  She promises to do so. 4. She will be seen in follow-up appointment on a as needed basis.  She had multiple questions which were answered to her satisfaction.   Medication Adjustments/Labs and Tests Ordered: Current medicines are reviewed at length with the patient today.  Concerns regarding medicines are outlined above.  No orders of the defined types were placed in this encounter.  No orders of the defined types were placed in this encounter.    Chief Complaint  Patient presents with  . Follow-up    1 Month     History of Present Illness:    Meredith Butler is a 64 y.o. female.  Patient has past medical history of essential hypertension and dyslipidemia and overweight status.  She was evaluated for significant symptoms suggestive of angina and underwent coronary angiography.  Fortunately coronary angiography revealed normally coronary arteries with preserved systolic function and patient is happy about it.  She has started walking  some on a regular basis.  At the time of my evaluation, the patient is alert awake oriented and in no distress.  Past Medical History:  Diagnosis Date  . Asthma   . Diabetes mellitus without complication ([place])   . Fibromyalgia   . Hypertension   . Lupus ([place])   . Peritonitis [facility])     Past Surgical History:  Procedure Laterality Date  . ABDOMINAL HYSTERECTOMY     complete  . ABDOMINAL SURGERY    . APPENDECTOMY    . CHOLECYSTECTOMY    . COLON SURGERY    . JOINT REPLACEMENT Bilateral    knee  . LEFT HEART CATH AND CORONARY ANGIOGRAPHY N/A 01/05/2020   Procedure: LEFT HEART CATH AND CORONARY ANGIOGRAPHY;  Surgeon: Martinique, Peter M, MD;  Location: [place] [facility];  Service: Cardiovascular;  Laterality: N/A;  . SMALL INTESTINE SURGERY    . SPLENECTOMY, TOTAL    . TONSILLECTOMY      Current Medications: Current Meds  Medication Sig  . albuterol (VENTOLIN HFA) 108 (90 Base) MCG/ACT inhaler Inhale 2 puffs into the lungs every 6 (six) hours as needed for wheezing or shortness of breath.  Marland Kitchen aspirin EC 81 MG tablet Take 1 tablet (81 mg total) by mouth daily. (Patient taking differently: Take 81 mg by mouth every evening. )  . cetirizine (ZYRTEC) 10 MG tablet Take 1 tablet (10 mg total) by mouth daily. (Patient taking differently: Take 10 mg by mouth daily as needed (allergies.). )  .  Cholecalciferol (VITAMIN D3) 50 MCG (2000 UT) TABS Take 2,000 Units by mouth daily.  . cyclobenzaprine (FLEXERIL) 10 MG tablet Take 1 tablet (10 mg total) by mouth 3 (three) times daily as needed for muscle spasms.  Marland Kitchen EPINEPHrine (EPIPEN 2-PAK) 0.3 mg/0.3 mL IJ SOAJ injection Inject 0.3 mLs (0.3 mg total) into the muscle as needed for anaphylaxis.  Marland Kitchen ezetimibe (ZETIA) 10 MG tablet TAKE 1 TABLET BY MOUTH EVERY DAY (Patient taking differently: Take 10 mg by mouth daily. )  . glucose blood test strip Use as instructed  . lisinopril (ZESTRIL) 10 MG tablet Take 1 tablet (10 mg total) by mouth daily.  Marland Kitchen  lisinopril-hydrochlorothiazide (ZESTORETIC) 20-25 MG tablet TAKE 1 TABLET BY MOUTH EVERY DAY  . Magnesium 250 MG TABS Take 250 mg by mouth daily as needed (leg cramps.).   Marland Kitchen metFORMIN (GLUCOPHAGE) 1000 MG tablet Take 1 tablet (1,000 mg total) by mouth 2 (two) times daily with a meal.  . Multiple Vitamins-Minerals (ZINC PO) Take 50 mg by mouth every Monday.   . nitroGLYCERIN (NITROSTAT) 0.4 MG SL tablet Place 1 tablet (0.4 mg total) under the tongue every 5 (five) minutes as needed for chest pain.  Marland Kitchen sertraline (ZOLOFT) 25 MG tablet Take 1 tablet (25 mg total) by mouth daily.  . vitamin E (VITAMIN E) 180 MG (400 UNITS) capsule Take 400 Units by mouth daily.     Allergies:   Claforan [cefotaxime], Ibuprofen, Omnicef [cefdinir], Penicillins, Sulfa antibiotics, Tetanus toxoids, Voltaren [diclofenac sodium], Codeine, Diovan [valsartan], Rocephin [ceftriaxone], and Statins   Social History   Socioeconomic History  . Marital status: Married    Spouse name: Not on file  . Number of children: Not on file  . Years of education: Not on file  . Highest education level: Not on file  Occupational History  . Not on file  Tobacco Use  . Smoking status: Former Smoker    Packs/day: 2.00    Years: 30.00    Pack years: 60.00    Types: Cigarettes    Quit date: 11/20/2009    Years since quitting: 10.1  . Smokeless tobacco: Never Used  Substance and Sexual Activity  . Alcohol use: No  . Drug use: No  . Sexual activity: Yes  Other Topics Concern  . Not on file  Social History Narrative  . Not on file   Social Determinants of Health   Financial Resource Strain:   . Difficulty of Paying Living Expenses: Not on file  Food Insecurity:   . Worried About Charity fundraiser in the Last Year: Not on file  . Ran Out of Food in the Last Year: Not on file  Transportation Needs:   . Lack of Transportation (Medical): Not on file  . Lack of Transportation (Non-Medical): Not on file  Physical Activity:   .  Days of Exercise per Week: Not on file  . Minutes of Exercise per Session: Not on file  Stress:   . Feeling of Stress : Not on file  Social Connections:   . Frequency of Communication with Friends and Family: Not on file  . Frequency of Social Gatherings with Friends and Family: Not on file  . Attends Religious Services: Not on file  . Active Member of Clubs or Organizations: Not on file  . Attends Archivist Meetings: Not on file  . Marital Status: Not on file     Family History: The patient's family history includes Cancer in her maternal grandmother, mother,  paternal grandfather, and sister; Diabetes in her daughter, daughter, maternal aunt, maternal uncle, mother, and sister; Heart attack in her father and mother.  ROS:   Please see the history of present illness.    All other systems reviewed and are negative.    EKGs/Labs/Other Studies Reviewed:    The following studies were reviewed today: Martinique, Peter M, MD Study date: 01/05/20  MyChart Results Release  MyChart Status: [place]   Physicians  Panel Physicians Referring Physician Case Authorizing Physician  Martinique, Peter M, MD (Primary)    Procedures  LEFT HEART CATH AND CORONARY ANGIOGRAPHY  Conclusion    The left ventricular systolic function is normal.  LV end diastolic pressure is normal.  The left ventricular ejection fraction is 55-65% by visual estimate.   1. No significant CAD 2. Normal LV function 3. Normal LVEDP  Plan: risk factor modification.       Recent Labs: 07/15/2019: ALT 30; TSH 2.320 12/28/2019: BUN 17; Creatinine, Ser 0.83; Hemoglobin 14.9; Platelets 329; Potassium 4.9; Sodium 141  Recent Lipid Panel    Component Value Date/Time   CHOL 206 (H) 07/15/2019 1424   TRIG 152 (H) 07/15/2019 1424   HDL 50 07/15/2019 1424   LDLCALC 126 (H) 07/15/2019 1424    Physical Exam:    VS:  BP (!) 140/94   Pulse 86   Ht 5\' 8"  (1.727 m)   Wt 238 lb (108 kg)   SpO2 96%   BMI  36.19 kg/m     Wt Readings from Last 3 Encounters:  01/25/20 238 lb (108 kg)  01/21/20 236 lb 9.6 oz (107.3 kg)  01/05/20 235 lb (106.6 kg)     GEN: Patient is in no acute distress HEENT: Normal NECK: No JVD; No carotid bruits LYMPHATICS: No lymphadenopathy CARDIAC: Hear sounds regular, 2/6 systolic murmur at the apex. RESPIRATORY:  Clear to auscultation without rales, wheezing or rhonchi  ABDOMEN: Soft, non-tender, non-distended MUSCULOSKELETAL:  No edema; No deformity  SKIN: Warm and dry NEUROLOGIC:  Alert and oriented x 3 PSYCHIATRIC:  Normal affect   Signed, Jenean Lindau, MD  01/25/2020 11:25 AM    [place]

## 2020-01-25 NOTE — Patient Instructions (Signed)
Medication Instructions:  No medication changes *If you need a refill on your cardiac medications before your next appointment, please call your pharmacy*   Lab Work: None labs orderd If you have labs (blood work) drawn today and your tests are completely normal, you will receive your results only by: Marland Kitchen MyChart Message (if you have MyChart) OR . A paper copy in the mail If you have any lab test that is abnormal or we need to change your treatment, we will call you to review the results.   Testing/Procedures: None orderd   Follow-Up: At [facility], you and your health needs are our priority.  As part of our continuing mission to provide you with exceptional heart care, we have created designated Provider Care Teams.  These Care Teams include your primary Cardiologist (physician) and Advanced Practice Providers (APPs -  Physician Assistants and Nurse Practitioners) who all work together to provide you with the care you need, when you need it.  We recommend signing up for the patient portal called "MyChart".  Sign up information is provided on this After Visit Summary.  MyChart is used to connect with patients for Virtual Visits (Telemedicine).  Patients are able to view lab/test results, encounter notes, upcoming appointments, etc.  Non-urgent messages can be sent to your provider as well.   To learn more about what you can do with MyChart, go to [email or web address].    Follow up with Dr. Geraldo Pitter as needed.  Other Instructions NA

## 2020-02-16 ENCOUNTER — Telehealth: Payer: Self-pay | Admitting: Family Medicine

## 2020-02-16 NOTE — Telephone Encounter (Signed)
Patient's husband calling to inquire id Merrie Roof would be willing to send RX to pharmacy for poison ivy on both of patients forearms.

## 2020-02-17 ENCOUNTER — Other Ambulatory Visit: Payer: Self-pay

## 2020-02-17 ENCOUNTER — Ambulatory Visit (INDEPENDENT_AMBULATORY_CARE_PROVIDER_SITE_OTHER): Payer: BC Managed Care – PPO | Admitting: Family Medicine

## 2020-02-17 ENCOUNTER — Encounter: Payer: Self-pay | Admitting: Family Medicine

## 2020-02-17 VITALS — BP 129/87 | HR 86 | Temp 99.0°F | Wt 237.0 lb

## 2020-02-17 DIAGNOSIS — R21 Rash and other nonspecific skin eruption: Secondary | ICD-10-CM | POA: Diagnosis not present

## 2020-02-17 MED ORDER — TRIAMCINOLONE ACETONIDE 0.1 % EX CREA: 1.0000 "application " | TOPICAL_CREAM | Freq: Two times a day (BID) | CUTANEOUS | 0 refills | Status: DC

## 2020-02-17 MED ORDER — PREDNISONE 10 MG PO TABS: ORAL_TABLET | ORAL | 0 refills | Status: DC

## 2020-02-17 NOTE — Progress Notes (Signed)
   BP 129/87   Pulse 86   Temp 99 F (37.2 C) (Oral)   Wt 237 lb (107.5 kg)   SpO2 96%   BMI 36.04 kg/m    Subjective:    Patient ID: Meredith Butler, female    DOB: [birth year], 64 y.o.   MRN: [id number]  HPI: Meredith Butler is a 64 y.o. female  Chief Complaint  Patient presents with  . Rash    bilateral arms and face since last Sunday    Rash on both arms and face x 4 days since doing some yard work. Taking allergy medications and calamine lotion with no relief. Blistering, weeping b/l arms and spreading over time.   Relevant past medical, surgical, family and social history reviewed and updated as indicated. Interim medical history since our last visit reviewed. Allergies and medications reviewed and updated.  Review of Systems  Per HPI unless specifically indicated above     Objective:    BP 129/87   Pulse 86   Temp 99 F (37.2 C) (Oral)   Wt 237 lb (107.5 kg)   SpO2 96%   BMI 36.04 kg/m   Wt Readings from Last 3 Encounters:  02/17/20 237 lb (107.5 kg)  01/25/20 238 lb (108 kg)  01/21/20 236 lb 9.6 oz (107.3 kg)    Physical Exam Vitals and nursing note reviewed.  Constitutional:      Appearance: Normal appearance. She is not ill-appearing.  HENT:     Head: Atraumatic.  Eyes:     Extraocular Movements: Extraocular movements intact.     Conjunctiva/sclera: Conjunctivae normal.  Cardiovascular:     Rate and Rhythm: Normal rate and regular rhythm.     Heart sounds: Normal heart sounds.  Pulmonary:     Effort: Pulmonary effort is normal.     Breath sounds: Normal breath sounds.  Musculoskeletal:        General: Normal range of motion.     Cervical back: Normal range of motion and neck supple.  Skin:    General: Skin is warm.     Findings: Rash (erythematous blistering rash b/l arms and face) present.  Neurological:     Mental Status: She is alert and oriented to person, place, and time.  Psychiatric:        Mood and Affect: Mood normal.        Thought Content:  Thought content normal.        Judgment: Judgment normal.     Results for orders placed or performed during the hospital encounter of 01/05/20  Glucose, capillary  Result Value Ref Range   Glucose-Capillary 150 (H) 70 - 99 mg/dL   Comment 1 Notify RN    Comment 2 Document in Chart       Assessment & Plan:   Problem List Items Addressed This Visit    None    Visit Diagnoses    Rash    -  Primary   Poison ivy vs poison oak, tx with prednisone taper and triamcinolone cream. F/u if not resolving       Follow up plan: Return for as scheduled.

## 2020-02-17 NOTE — Telephone Encounter (Signed)
Unable to lvm to make apt for today , Pt does have apt for 02/18/2020.

## 2020-02-17 NOTE — Telephone Encounter (Signed)
Needs appt, can be video. Ok to work in today if desired

## 2020-02-17 NOTE — Telephone Encounter (Signed)
Pt called back, scheduled appt for 4 today

## 2020-02-23 ENCOUNTER — Telehealth (INDEPENDENT_AMBULATORY_CARE_PROVIDER_SITE_OTHER): Payer: BC Managed Care – PPO | Admitting: Family Medicine

## 2020-02-23 ENCOUNTER — Encounter: Payer: Self-pay | Admitting: Family Medicine

## 2020-02-23 VITALS — Wt 237.0 lb

## 2020-02-23 DIAGNOSIS — F321 Major depressive disorder, single episode, moderate: Secondary | ICD-10-CM

## 2020-02-23 DIAGNOSIS — R21 Rash and other nonspecific skin eruption: Secondary | ICD-10-CM | POA: Diagnosis not present

## 2020-02-23 NOTE — Progress Notes (Signed)
Wt 237 lb (107.5 kg)   BMI 36.04 kg/m    Subjective:    Patient ID: Meredith Butler, female    DOB: 09/16/1956, 64 y.o.   MRN: [id number]  HPI: Meredith Butler is a 64 y.o. female  Chief Complaint  Patient presents with  . Depression  . Rash    pt states rash on arms are is not getting better    . This visit was completed via MyChart due to the restrictions of the COVID-19 pandemic. All issues as above were discussed and addressed. Physical exam was done as above through visual confirmation on MyChart. If it was felt that the patient should be evaluated in the office, they were directed there. The patient verbally consented to this visit. . Location of the patient: home . Location of the provider: home . Those involved with this call:  . Provider: Merrie Roof, PA-C . CMA: Lesle Chris, [place] . Front Desk/Registration: Jill Side  . Time spent on call: 15 minutes with patient face to face via video conference. More than 50% of this time was spent in counseling and coordination of care. 5 minutes total spent in review of patient's record and preparation of their chart. I verified patient identity using two factors (patient name and date of birth). Patient consents verbally to being seen via telemedicine visit today.   Presenting today for mood f/u after starting low dose zoloft. Feeling better, wanting to try coming off the meidcation. Tolerated it well just doesn't feel like it's needed any longer. Denies SI/HI.   Rash doesn't seem to be healing fully, both arms still itchy, scabbed. Face better though right eyelid area seems to be itching now. Denies spreading to other areas. Using cream daily and nearly done with prednisone taper.   Depression screen [facility] 2/9 02/23/2020 01/21/2020 07/15/2019  Decreased Interest 0 2 0  Down, Depressed, Hopeless 0 1 0  PHQ - 2 Score 0 3 0  Altered sleeping 0 2 0  Tired, decreased energy 0 1 0  Change in appetite 2 0 1  Feeling bad or failure about yourself  0  1 0  Trouble concentrating 0 1 0  Moving slowly or fidgety/restless 0 0 0  Suicidal thoughts 0 0 0  PHQ-9 Score 2 8 1   Difficult doing work/chores - Somewhat difficult -   GAD 7 : Generalized Anxiety Score 07/15/2019 04/10/2019  Nervous, Anxious, on Edge 0 1  Control/stop worrying 1 0  Worry too much - different things 0 0  Trouble relaxing 1 1  Restless 0 1  Easily annoyed or irritable - 1  Afraid - awful might happen 0 0  Total GAD 7 Score - 4  Anxiety Difficulty Not difficult at all -     Relevant past medical, surgical, family and social history reviewed and updated as indicated. Interim medical history since our last visit reviewed. Allergies and medications reviewed and updated.  Review of Systems  Per HPI unless specifically indicated above     Objective:    Wt 237 lb (107.5 kg)   BMI 36.04 kg/m   Wt Readings from Last 3 Encounters:  02/23/20 237 lb (107.5 kg)  02/17/20 237 lb (107.5 kg)  01/25/20 238 lb (108 kg)    Physical Exam Vitals and nursing note reviewed.  Constitutional:      General: She is not in acute distress.    Appearance: Normal appearance.  HENT:     Head: Atraumatic.     Right Ear:  External ear normal.     Left Ear: External ear normal.     Nose: Nose normal. No congestion.     Mouth/Throat:     Mouth: Mucous membranes are moist.     Pharynx: Oropharynx is clear. No posterior oropharyngeal erythema.  Eyes:     Extraocular Movements: Extraocular movements intact.     Conjunctiva/sclera: Conjunctivae normal.  Cardiovascular:     Comments: Unable to assess via virtual visit Pulmonary:     Effort: Pulmonary effort is normal. No respiratory distress.  Musculoskeletal:        General: Normal range of motion.     Cervical back: Normal range of motion.  Skin:    General: Skin is dry.     Findings: Rash (areas of scabbing b/l upper extremities) present. No erythema.  Neurological:     Mental Status: She is alert and oriented to person,  place, and time.  Psychiatric:        Mood and Affect: Mood normal.        Thought Content: Thought content normal.        Judgment: Judgment normal.    Results for orders placed or performed during the hospital encounter of 01/05/20  Glucose, capillary  Result Value Ref Range   Glucose-Capillary 150 (H) 70 - 99 mg/dL   Comment 1 Notify RN    Comment 2 Document in Chart       Assessment & Plan:   Problem List Items Addressed This Visit      Other   Depression, major, single episode, moderate ([place]) - Primary    Wanting to taper off zoloft as she's feeling better. Discussed a good taper schedule and to follow up if feeling worse off of it.        Other Visit Diagnoses    Rash       Recent allergic plant rash. Rash not fully healed but does appear to be drying/scabbing. Continue cream, finish prednisone. may ned to extend prednisone       Follow up plan: Return for as scheduled.

## 2020-02-29 ENCOUNTER — Other Ambulatory Visit: Payer: Self-pay | Admitting: Family Medicine

## 2020-02-29 NOTE — Telephone Encounter (Signed)
Requested Prescriptions  Pending Prescriptions Disp Refills  . ezetimibe (ZETIA) 10 MG tablet [Pharmacy Med Name: EZETIMIBE 10 MG TABLET] 90 tablet 0    Sig: TAKE 1 TABLET BY MOUTH EVERY DAY     Cardiovascular:  Antilipid - Sterol Transport Inhibitors Failed - 02/29/2020  3:14 PM      Failed - Total Cholesterol in normal range and within 360 days    Cholesterol, Total  Date Value Ref Range Status  07/15/2019 206 (H) 100 - 199 mg/dL Final         Failed - LDL in normal range and within 360 days    LDL Calculated  Date Value Ref Range Status  07/15/2019 126 (H) 0 - 99 mg/dL Final    Comment:    **Effective July 20, 2019, [facility] is implementing an improved** equation to calculate Low Density Lipoprotein Cholesterol (LDL-C) concentrations, to be used in all lipid panels that report calculated LDL-C. This equation was developed through a collaboration with the [employer], Lung and [place] (NIH).[1] The NIH calculation overcomes the limitations of the existing Friedewald LDL-C equation and performs equally well in both fasting and non-fasting individuals. 1. Pauline Good Q, et al. A Kloepfer equation for calculation of low-density lipoprotein cholesterol in patients with normolipidemia and/or hypertriglyceridemia. JAMA Cardiol. 2020 Feb 26. doi:10.1001/jamacardio.2020.0013          Failed - Triglycerides in normal range and within 360 days    Triglycerides  Date Value Ref Range Status  07/15/2019 152 (H) 0 - 149 mg/dL Final         Passed - HDL in normal range and within 360 days    HDL  Date Value Ref Range Status  07/15/2019 50 >39 mg/dL Final         Passed - Valid encounter within last 12 months    Recent Outpatient Visits          6 days ago Depression, major, single episode, moderate ([place])   [place], Lilia Argue, [place]   1 week ago [place], [place], [place]   1 month ago  Essential hypertension, benign   [place], NP   2 months ago Essential hypertension, benign   [place], NP   4 months ago Acute maxillary sinusitis, recurrence not specified   [facility] Merrie Roof [place], [place]

## 2020-02-29 NOTE — Assessment & Plan Note (Signed)
Wanting to taper off zoloft as she's feeling better. Discussed a good taper schedule and to follow up if feeling worse off of it.

## 2020-04-17 ENCOUNTER — Other Ambulatory Visit: Payer: Self-pay | Admitting: Family Medicine

## 2020-04-17 NOTE — Telephone Encounter (Signed)
Requested Prescriptions  Pending Prescriptions Disp Refills  . lisinopril-hydrochlorothiazide (ZESTORETIC) 20-25 MG tablet [Pharmacy Med Name: LISINOPRIL-HCTZ 20-25 MG TAB] 90 tablet 1    Sig: TAKE 1 TABLET BY MOUTH EVERY DAY     Cardiovascular:  ACEI + Diuretic Combos Passed - 04/17/2020  9:33 AM      Passed - Na in normal range and within 180 days    Sodium  Date Value Ref Range Status  12/28/2019 141 134 - 144 mmol/L Final         Passed - K in normal range and within 180 days    Potassium  Date Value Ref Range Status  12/28/2019 4.9 3.5 - 5.2 mmol/L Final         Passed - Cr in normal range and within 180 days    Creatinine, Ser  Date Value Ref Range Status  12/28/2019 0.83 0.57 - 1.00 mg/dL Final         Passed - Ca in normal range and within 180 days    Calcium  Date Value Ref Range Status  12/28/2019 10.2 8.7 - 10.3 mg/dL Final         Passed - Patient is not pregnant      Passed - Last BP in normal range    BP Readings from Last 1 Encounters:  02/17/20 129/87         Passed - Valid encounter within last 6 months    Recent Outpatient Visits          1 month ago Depression, major, single episode, moderate ([place])   [facility] Volney American, [place]   2 months ago [place], [place], [place]   2 months ago Essential hypertension, benign   [place], Jessica I, NP   3 months ago Essential hypertension, benign   [facility] Carnella [place] I, NP   6 months ago Acute maxillary sinusitis, recurrence not specified   [place], [place], [place]

## 2020-04-26 ENCOUNTER — Other Ambulatory Visit: Payer: Self-pay | Admitting: Family Medicine

## 2020-04-26 DIAGNOSIS — E119 Type 2 diabetes mellitus without complications: Secondary | ICD-10-CM

## 2020-04-26 DIAGNOSIS — I10 Essential (primary) hypertension: Secondary | ICD-10-CM

## 2020-04-26 NOTE — Telephone Encounter (Signed)
Medication Refill - Medication: metFORMIN (GLUCOPHAGE) 1000 MG tablet lisinopril (ZESTRIL) 10 MG tablet   Has the patient contacted their pharmacy? Yes.   (Agent: If no, request that the patient contact the pharmacy for the refill.) (Agent: If yes, when and what did the pharmacy advise?)  Preferred Pharmacy (with phone number or street name):  [id number] [place], [address]  [place] [address]  Phone: [phone] Fax: [phone]     Agent: Please be advised that RX refills may take up to 3 business days. We ask that you follow-up with your pharmacy.

## 2020-04-27 ENCOUNTER — Other Ambulatory Visit: Payer: Self-pay

## 2020-04-27 DIAGNOSIS — E119 Type 2 diabetes mellitus without complications: Secondary | ICD-10-CM

## 2020-04-27 MED ORDER — METFORMIN HCL 1000 MG PO TABS: 1000.0000 mg | ORAL_TABLET | Freq: Two times a day (BID) | ORAL | 0 refills | Status: DC

## 2020-04-27 MED ORDER — LISINOPRIL 10 MG PO TABS: 10.0000 mg | ORAL_TABLET | Freq: Every day | ORAL | 0 refills | Status: DC

## 2020-04-27 NOTE — Telephone Encounter (Signed)
Routing to provider  

## 2020-05-02 ENCOUNTER — Telehealth: Payer: Self-pay | Admitting: Family Medicine

## 2020-05-02 NOTE — Telephone Encounter (Signed)
Copied from [place] [id number]. Topic: General - Other >> May 02, 2020  1:08 PM Rainey Pines A wrote: Patient stated that she called in last week to request her precriptions but the pharmacy still does not have them. Patient stated that her metFORMIN (GLUCOPHAGE) 1000 MG tablet and lisinopril (ZESTRIL) 10 MG tablet was left out and now she is completely out of medications. Patient stated that the pharmacy has tried reaching out multiple times to the office but hasn't gotten a response back. Patient is requesting a callback from the office once her medication has been sent over today. Please advise

## 2020-05-02 NOTE — Telephone Encounter (Signed)
RX for Metformin was sent 04/27/20 but says no print so it did not get to the pharmacy. Lisinopril was confirmed by pharmacy on 04/27/20. [facility] for the patient. Called in Metformin verbally.  Called patient and let her know what happened and that I called in the Metformin. Apologized to the patient for the delay as medication didn't make it to the pharmacy.

## 2020-05-05 ENCOUNTER — Other Ambulatory Visit: Payer: Self-pay | Admitting: Family Medicine

## 2020-05-24 ENCOUNTER — Other Ambulatory Visit: Payer: Self-pay | Admitting: Family Medicine

## 2020-06-15 ENCOUNTER — Encounter: Payer: Self-pay | Admitting: Family Medicine

## 2020-06-15 ENCOUNTER — Ambulatory Visit: Payer: BC Managed Care – PPO | Admitting: Family Medicine

## 2020-06-15 ENCOUNTER — Other Ambulatory Visit: Payer: Self-pay

## 2020-06-15 ENCOUNTER — Other Ambulatory Visit: Payer: Self-pay | Admitting: Family Medicine

## 2020-06-15 VITALS — BP 144/92 | HR 84 | Temp 97.9°F | Wt 241.0 lb

## 2020-06-15 DIAGNOSIS — I700 Atherosclerosis of aorta: Secondary | ICD-10-CM

## 2020-06-15 DIAGNOSIS — R8290 Unspecified abnormal findings in urine: Secondary | ICD-10-CM

## 2020-06-15 DIAGNOSIS — I10 Essential (primary) hypertension: Secondary | ICD-10-CM

## 2020-06-15 DIAGNOSIS — N760 Acute vaginitis: Secondary | ICD-10-CM | POA: Diagnosis not present

## 2020-06-15 DIAGNOSIS — B9689 Other specified bacterial agents as the cause of diseases classified elsewhere: Secondary | ICD-10-CM

## 2020-06-15 DIAGNOSIS — M791 Myalgia, unspecified site: Secondary | ICD-10-CM

## 2020-06-15 DIAGNOSIS — J069 Acute upper respiratory infection, unspecified: Secondary | ICD-10-CM

## 2020-06-15 DIAGNOSIS — T466X5A Adverse effect of antihyperlipidemic and antiarteriosclerotic drugs, initial encounter: Secondary | ICD-10-CM

## 2020-06-15 DIAGNOSIS — E119 Type 2 diabetes mellitus without complications: Secondary | ICD-10-CM | POA: Diagnosis not present

## 2020-06-15 DIAGNOSIS — E7800 Pure hypercholesterolemia, unspecified: Secondary | ICD-10-CM

## 2020-06-15 LAB — MICROSCOPIC EXAMINATION: WBC, UA: NONE SEEN /hpf (ref 0–5)

## 2020-06-15 LAB — UA/M W/RFLX CULTURE, ROUTINE
Bilirubin, UA: NEGATIVE
Ketones, UA: NEGATIVE
Leukocytes,UA: NEGATIVE
Nitrite, UA: NEGATIVE
Protein,UA: NEGATIVE
Specific Gravity, UA: 1.01 (ref 1.005–1.030)
Urobilinogen, Ur: 0.2 mg/dL (ref 0.2–1.0)
pH, UA: 5.5 (ref 5.0–7.5)

## 2020-06-15 LAB — WET PREP FOR TRICH, YEAST, CLUE
Clue Cell Exam: POSITIVE — AB
Trichomonas Exam: NEGATIVE
Yeast Exam: NEGATIVE

## 2020-06-15 MED ORDER — FLUTICASONE PROPIONATE 50 MCG/ACT NA SUSP
2.0000 | Freq: Two times a day (BID) | NASAL | 6 refills | Status: DC | PRN
Start: 2020-06-15 — End: 2021-03-10

## 2020-06-15 MED ORDER — FLUCONAZOLE 150 MG PO TABS
150.0000 mg | ORAL_TABLET | Freq: Once | ORAL | 0 refills | Status: AC
Start: 2020-06-15 — End: 2020-06-15

## 2020-06-15 MED ORDER — BREO ELLIPTA 200-25 MCG/INH IN AEPB: 1.0000 | INHALATION_SPRAY | Freq: Every day | RESPIRATORY_TRACT | 2 refills | Status: DC

## 2020-06-15 MED ORDER — METRONIDAZOLE 500 MG PO TABS
500.0000 mg | ORAL_TABLET | Freq: Three times a day (TID) | ORAL | 0 refills | Status: DC
Start: 2020-06-15 — End: 2020-07-13

## 2020-06-15 MED ORDER — CETIRIZINE HCL 10 MG PO TABS: 10.0000 mg | ORAL_TABLET | Freq: Every day | ORAL | 11 refills | Status: DC

## 2020-06-15 MED ORDER — LISINOPRIL 20 MG PO TABS: 20.0000 mg | ORAL_TABLET | Freq: Every day | ORAL | 0 refills | Status: DC

## 2020-06-15 NOTE — Patient Instructions (Signed)
Mucinex (not the DM version because of your blood pressures) twice daily for several weeks if needed

## 2020-06-15 NOTE — Progress Notes (Signed)
BP (!) 144/92   Pulse 84   Temp 97.9 F (36.6 C) (Oral)   Wt (!) 241 lb (109.3 kg)   SpO2 97%   BMI 36.64 kg/m    Subjective:    Patient ID: Meredith Butler, female    DOB: Dec 07, 1955, 64 y.o.   MRN: [id number]  HPI: Meredith Butler is a 64 y.o. female  Chief Complaint  Patient presents with  . Headache    x 4 days  . Ear Problem    bilateral pressure  . Shortness of Breath  . Medication Refill    epipen injection   Lower abdominal cramping, crusting eyes, ear pressure and pain, nasal congestion, SOB, mild cough.  for about 4 days now. Does have asthma and allergies, compliant with medications. Denies sick contacts, recent travel, fever, chills, CP, N/V. Not trying anything OTC for sxs at this time.   Overdue for DM f/u. Taking metformin daily without side effects. Home BSs in the AM running around 130s. Recently went to [place] on vacation and states she ate a lot of sweets. Denies low blood sugar spells.   HTN - home readings running 140s/90s. Taking medication faithfully without side effects. Denies CP, visual changes, HAs, dizziness.   HLD, aortic atherosclerosis - on zetia, intolerant to statins.   Relevant past medical, surgical, family and social history reviewed and updated as indicated. Interim medical history since our last visit reviewed. Allergies and medications reviewed and updated.  Review of Systems  Per HPI unless specifically indicated above     Objective:    BP (!) 144/92   Pulse 84   Temp 97.9 F (36.6 C) (Oral)   Wt (!) 241 lb (109.3 kg)   SpO2 97%   BMI 36.64 kg/m   Wt Readings from Last 3 Encounters:  06/15/20 (!) 241 lb (109.3 kg)  02/23/20 237 lb (107.5 kg)  02/17/20 237 lb (107.5 kg)    Physical Exam Vitals and nursing note reviewed.  Constitutional:      Appearance: Normal appearance. She is not ill-appearing.  HENT:     Head: Atraumatic.     Right Ear: Tympanic membrane normal.     Left Ear: Tympanic membrane normal.     Nose: Congestion  present.     Mouth/Throat:     Mouth: Mucous membranes are moist.     Pharynx: Oropharynx is clear. Posterior oropharyngeal erythema present.  Eyes:     Extraocular Movements: Extraocular movements intact.     Conjunctiva/sclera: Conjunctivae normal.  Cardiovascular:     Rate and Rhythm: Normal rate and regular rhythm.     Heart sounds: Normal heart sounds.  Pulmonary:     Effort: Pulmonary effort is normal. No respiratory distress.     Breath sounds: Wheezing (minimal, diffuse) present. No rales.  Abdominal:     General: Bowel sounds are normal. There is no distension.     Palpations: Abdomen is soft.     Tenderness: There is no abdominal tenderness. There is no right CVA tenderness, left CVA tenderness or guarding.  Musculoskeletal:        General: Normal range of motion.     Cervical back: Normal range of motion and neck supple.  Skin:    General: Skin is warm and dry.  Neurological:     Mental Status: She is alert and oriented to person, place, and time.  Psychiatric:        Mood and Affect: Mood normal.  Thought Content: Thought content normal.        Judgment: Judgment normal.     Results for orders placed or performed in visit on 06/15/20  WET PREP FOR [place], YEAST, CLUE   Specimen: Vaginal Fluid   Vaginal Flui  Result Value Ref Range   Trichomonas Exam Negative Negative   Yeast Exam Negative Negative   Clue Cell Exam Positive (A) Negative  Microscopic Examination   Urine  Result Value Ref Range   WBC, UA None seen 0 - 5 /hpf   RBC 0-2 0 - 2 /hpf   Epithelial Cells (non renal) 0-10 0 - 10 /hpf   Bacteria, UA Few (A) None seen/Few  UA/M w/rflx Culture, Routine   Specimen: Urine   Urine  Result Value Ref Range   Specific Gravity, UA 1.010 1.005 - 1.030   pH, UA 5.5 5.0 - 7.5   Color, UA Yellow Yellow   Appearance Ur Clear Clear   Leukocytes,UA Negative Negative   Protein,UA Negative Negative/Trace   Glucose, UA 1+ (A) Negative   Ketones, UA Negative  Negative   RBC, UA Trace (A) Negative   Bilirubin, UA Negative Negative   Urobilinogen, Ur 0.2 0.2 - 1.0 mg/dL   Nitrite, UA Negative Negative   Microscopic Examination See below:   Comprehensive metabolic panel  Result Value Ref Range   Glucose 193 (H) 65 - 99 mg/dL   BUN 10 8 - 27 mg/dL   Creatinine, Ser 0.71 0.57 - 1.00 mg/dL   GFR calc non Af Amer 90 >59 mL/min/1.73   GFR calc Af Amer 104 >59 mL/min/1.73   BUN/Creatinine Ratio 14 12 - 28   Sodium 138 134 - 144 mmol/L   Potassium 4.5 3.5 - 5.2 mmol/L   Chloride 98 96 - 106 mmol/L   CO2 25 20 - 29 mmol/L   Calcium 9.4 8.7 - 10.3 mg/dL   Total Protein 7.0 6.0 - 8.5 g/dL   Albumin 4.3 3.8 - 4.8 g/dL   Globulin, Total 2.7 1.5 - 4.5 g/dL   Albumin/Globulin Ratio 1.6 1.2 - 2.2   Bilirubin Total 0.9 0.0 - 1.2 mg/dL   Alkaline Phosphatase 78 48 - 121 IU/L   AST 21 0 - 40 IU/L   ALT 31 0 - 32 IU/L  Lipid Panel w/o Chol/HDL Ratio  Result Value Ref Range   Cholesterol, Total 148 100 - 199 mg/dL   Triglycerides 204 (H) 0 - 149 mg/dL   HDL 49 >39 mg/dL   VLDL Cholesterol Cal 33 5 - 40 mg/dL   LDL Chol Calc (NIH) 66 0 - 99 mg/dL  HgB A1c  Result Value Ref Range   Hgb A1c MFr Bld 7.3 (H) 4.8 - 5.6 %   Est. average glucose Bld gHb Est-mCnc 163 mg/dL      Assessment & Plan:   Problem List Items Addressed This Visit      Cardiovascular and Mediastinum   Essential hypertension, benign    Increase lisinopril to 20 mg to take in addition to lisinopril HCTZ combo tab. Monitor home readings daily. Continue to work on [employer], exercise      Relevant Medications   lisinopril (ZESTRIL) 20 MG tablet   Other Relevant Orders   Comprehensive metabolic panel (Completed)   Aortic atherosclerosis (HCC)   Relevant Medications   lisinopril (ZESTRIL) 20 MG tablet   Other Relevant Orders   Lipid Panel w/o Chol/HDL Ratio (Completed)     Endocrine   Type 2 diabetes  mellitus without complication, without long-term current use of insulin  (HCC) (Chronic)    Recheck A1C, adjust as needed. Continue metformin regimen      Relevant Medications   lisinopril (ZESTRIL) 20 MG tablet   Other Relevant Orders   HgB A1c (Completed)     Other   Hypercholesteremia    Recheck lipids, adjust as needed. Continue zetia due to statin intolerance and lifestyle modifications      Relevant Medications   lisinopril (ZESTRIL) 20 MG tablet   Myalgia due to statin    Other Visit Diagnoses    Viral URI    -  Primary   Discussed supportive care, breo given for SOB issues. Strict return precautions given   Relevant Medications   metroNIDAZOLE (FLAGYL) 500 MG tablet   BV (bacterial vaginosis)       Tx with flagyl, probiotics, good vaginal hygiene. Unclear if this is related to her current abdominal sxs. Monitor closely for improvement   Relevant Medications   metroNIDAZOLE (FLAGYL) 500 MG tablet   Other Relevant Orders   WET PREP FOR [place], YEAST, CLUE (Completed)   Bad odor of urine       Relevant Orders   UA/M w/rflx Culture, Routine (Completed)       Follow up plan: Return in about 4 weeks (around 07/13/2020) for BP recheck.

## 2020-06-15 NOTE — Telephone Encounter (Signed)
Spoke with pharmacy and they have script on file and will get ready for patient

## 2020-06-15 NOTE — Telephone Encounter (Signed)
Medication Refill - Medication: EPINEPHRINE 0.3 mg/0.3 mL IJ SOAJ injection    Preferred Pharmacy (with phone number or street name):  [id number] [place] , [place] Phone:  [phone]  Fax:  [phone]       Agent: Please be advised that RX refills may take up to 3 business days. We ask that you follow-up with your pharmacy.

## 2020-06-16 LAB — LIPID PANEL W/O CHOL/HDL RATIO
Cholesterol, Total: 148 mg/dL (ref 100–199)
HDL: 49 mg/dL (ref >39)
LDL Chol Calc (NIH): 66 mg/dL (ref 0–99)
Triglycerides: 204 mg/dL — ABNORMAL HIGH (ref 0–149)
VLDL Cholesterol Cal: 33 mg/dL (ref 5–40)

## 2020-06-16 LAB — COMPREHENSIVE METABOLIC PANEL
ALT: 31 IU/L (ref 0–32)
AST: 21 IU/L (ref 0–40)
Albumin/Globulin Ratio: 1.6 (ref 1.2–2.2)
Albumin: 4.3 g/dL (ref 3.8–4.8)
Alkaline Phosphatase: 78 IU/L (ref 48–121)
BUN/Creatinine Ratio: 14 (ref 12–28)
BUN: 10 mg/dL (ref 8–27)
Bilirubin Total: 0.9 mg/dL (ref 0.0–1.2)
CO2: 25 mmol/L (ref 20–29)
Calcium: 9.4 mg/dL (ref 8.7–10.3)
Chloride: 98 mmol/L (ref 96–106)
Creatinine, Ser: 0.71 mg/dL (ref 0.57–1.00)
GFR calc Af Amer: 104 mL/min/{1.73_m2} (ref >59)
GFR calc non Af Amer: 90 mL/min/{1.73_m2} (ref >59)
Globulin, Total: 2.7 g/dL (ref 1.5–4.5)
Glucose: 193 mg/dL — ABNORMAL HIGH (ref 65–99)
Potassium: 4.5 mmol/L (ref 3.5–5.2)
Sodium: 138 mmol/L (ref 134–144)
Total Protein: 7 g/dL (ref 6.0–8.5)

## 2020-06-16 LAB — HEMOGLOBIN A1C
Est. average glucose Bld gHb Est-mCnc: 163 mg/dL
Hgb A1c MFr Bld: 7.3 % — ABNORMAL HIGH (ref 4.8–5.6)

## 2020-06-18 DIAGNOSIS — T466X5A Adverse effect of antihyperlipidemic and antiarteriosclerotic drugs, initial encounter: Secondary | ICD-10-CM

## 2020-06-18 HISTORY — DX: Adverse effect of antihyperlipidemic and antiarteriosclerotic drugs, initial encounter: T46.6X5A

## 2020-06-18 NOTE — Assessment & Plan Note (Signed)
Recheck A1C, adjust as needed. Continue metformin regimen

## 2020-06-18 NOTE — Assessment & Plan Note (Signed)
Increase lisinopril to 20 mg to take in addition to lisinopril HCTZ combo tab. Monitor home readings daily. Continue to work on [employer], exercise

## 2020-06-18 NOTE — Assessment & Plan Note (Signed)
Recheck lipids, adjust as needed. Continue zetia due to statin intolerance and lifestyle modifications

## 2020-07-07 ENCOUNTER — Other Ambulatory Visit: Payer: Self-pay | Admitting: Family Medicine

## 2020-07-13 ENCOUNTER — Ambulatory Visit (INDEPENDENT_AMBULATORY_CARE_PROVIDER_SITE_OTHER): Payer: BC Managed Care – PPO | Admitting: Family Medicine

## 2020-07-13 ENCOUNTER — Encounter: Payer: Self-pay | Admitting: Family Medicine

## 2020-07-13 VITALS — BP 121/86 | HR 72 | Temp 98.0°F | Wt 232.0 lb

## 2020-07-13 DIAGNOSIS — E119 Type 2 diabetes mellitus without complications: Secondary | ICD-10-CM

## 2020-07-13 DIAGNOSIS — I10 Essential (primary) hypertension: Secondary | ICD-10-CM | POA: Diagnosis not present

## 2020-07-13 DIAGNOSIS — N760 Acute vaginitis: Secondary | ICD-10-CM

## 2020-07-13 DIAGNOSIS — B9689 Other specified bacterial agents as the cause of diseases classified elsewhere: Secondary | ICD-10-CM | POA: Diagnosis not present

## 2020-07-13 MED ORDER — METFORMIN HCL 1000 MG PO TABS: 1000.0000 mg | ORAL_TABLET | Freq: Two times a day (BID) | ORAL | 1 refills | Status: DC

## 2020-07-13 MED ORDER — METRONIDAZOLE 500 MG PO TABS: 500.0000 mg | ORAL_TABLET | Freq: Two times a day (BID) | ORAL | 0 refills | Status: DC

## 2020-07-13 MED ORDER — LISINOPRIL-HYDROCHLOROTHIAZIDE 20-25 MG PO TABS: 1.0000 | ORAL_TABLET | Freq: Every day | ORAL | 1 refills | Status: DC

## 2020-07-13 NOTE — Patient Instructions (Addendum)
Boric acid suppositories for vaginal health Female health probiotic

## 2020-07-13 NOTE — Progress Notes (Signed)
BP 121/86   Pulse 72   Temp 98 F (36.7 C) (Oral)   Wt 232 lb (105.2 kg)   SpO2 97%   BMI 35.28 kg/m    Subjective:    Patient ID: Meredith Butler, female    DOB: 07/17/1956, 64 y.o.   MRN: [id number]  HPI: Meredith Butler is a 64 y.o. female  Chief Complaint  Patient presents with  . Hypertension    pt would like to discuss about her last blood work results   Here today for HTN f/u. Currently on lisinopril HCTZ with additional 20 mg lisinopril, tolerating very well without issue. Denies CP, SOB, HAs, dizziness. Home BP readings have been 120s/80s. Trying to eat low sodium and exercise regularly.   Notes she took the flagyl for her BV but still having some discharge and discomfort since stopping it. Denies Ehrlich sxs, fever, chills, N/V.   Relevant past medical, surgical, family and social history reviewed and updated as indicated. Interim medical history since our last visit reviewed. Allergies and medications reviewed and updated.  Review of Systems  Per HPI unless specifically indicated above     Objective:    BP 121/86   Pulse 72   Temp 98 F (36.7 C) (Oral)   Wt 232 lb (105.2 kg)   SpO2 97%   BMI 35.28 kg/m   Wt Readings from Last 3 Encounters:  07/13/20 232 lb (105.2 kg)  06/15/20 (!) 241 lb (109.3 kg)  02/23/20 237 lb (107.5 kg)    Physical Exam Vitals and nursing note reviewed.  Constitutional:      Appearance: Normal appearance. She is not ill-appearing.  HENT:     Head: Atraumatic.  Eyes:     Extraocular Movements: Extraocular movements intact.     Conjunctiva/sclera: Conjunctivae normal.  Cardiovascular:     Rate and Rhythm: Normal rate and regular rhythm.     Heart sounds: Normal heart sounds.  Pulmonary:     Effort: Pulmonary effort is normal.     Breath sounds: Normal breath sounds.  Abdominal:     General: Bowel sounds are normal. There is no distension.     Palpations: Abdomen is soft.     Tenderness: There is no abdominal tenderness. There is no  right CVA tenderness or left CVA tenderness.  Musculoskeletal:        General: Normal range of motion.     Cervical back: Normal range of motion and neck supple.  Skin:    General: Skin is warm and dry.  Neurological:     Mental Status: She is alert and oriented to person, place, and time.  Psychiatric:        Mood and Affect: Mood normal.        Thought Content: Thought content normal.        Judgment: Judgment normal.     Results for orders placed or performed in visit on 06/15/20  WET PREP FOR [place], YEAST, CLUE   Specimen: Vaginal Fluid   Vaginal Flui  Result Value Ref Range   Trichomonas Exam Negative Negative   Yeast Exam Negative Negative   Clue Cell Exam Positive (A) Negative  Microscopic Examination   Urine  Result Value Ref Range   WBC, UA None seen 0 - 5 /hpf   RBC 0-2 0 - 2 /hpf   Epithelial Cells (non renal) 0-10 0 - 10 /hpf   Bacteria, UA Few (A) None seen/Few  UA/M w/rflx Culture, Routine   Specimen: Urine  Urine  Result Value Ref Range   Specific Gravity, UA 1.010 1.005 - 1.030   pH, UA 5.5 5.0 - 7.5   Color, UA Yellow Yellow   Appearance Ur Clear Clear   Leukocytes,UA Negative Negative   Protein,UA Negative Negative/Trace   Glucose, UA 1+ (A) Negative   Ketones, UA Negative Negative   RBC, UA Trace (A) Negative   Bilirubin, UA Negative Negative   Urobilinogen, Ur 0.2 0.2 - 1.0 mg/dL   Nitrite, UA Negative Negative   Microscopic Examination See below:   Comprehensive metabolic panel  Result Value Ref Range   Glucose 193 (H) 65 - 99 mg/dL   BUN 10 8 - 27 mg/dL   Creatinine, Ser 0.71 0.57 - 1.00 mg/dL   GFR calc non Af Amer 90 >59 mL/min/1.73   GFR calc Af Amer 104 >59 mL/min/1.73   BUN/Creatinine Ratio 14 12 - 28   Sodium 138 134 - 144 mmol/L   Potassium 4.5 3.5 - 5.2 mmol/L   Chloride 98 96 - 106 mmol/L   CO2 25 20 - 29 mmol/L   Calcium 9.4 8.7 - 10.3 mg/dL   Total Protein 7.0 6.0 - 8.5 g/dL   Albumin 4.3 3.8 - 4.8 g/dL   Globulin, Total  2.7 1.5 - 4.5 g/dL   Albumin/Globulin Ratio 1.6 1.2 - 2.2   Bilirubin Total 0.9 0.0 - 1.2 mg/dL   Alkaline Phosphatase 78 48 - 121 IU/L   AST 21 0 - 40 IU/L   ALT 31 0 - 32 IU/L  Lipid Panel w/o Chol/HDL Ratio  Result Value Ref Range   Cholesterol, Total 148 100 - 199 mg/dL   Triglycerides 204 (H) 0 - 149 mg/dL   HDL 49 >39 mg/dL   VLDL Cholesterol Cal 33 5 - 40 mg/dL   LDL Chol Calc (NIH) 66 0 - 99 mg/dL  HgB A1c  Result Value Ref Range   Hgb A1c MFr Bld 7.3 (H) 4.8 - 5.6 %   Est. average glucose Bld gHb Est-mCnc 163 mg/dL      Assessment & Plan:   Problem List Items Addressed This Visit      Cardiovascular and Mediastinum   Essential hypertension, benign - Primary    Improved, WNL. Continue current medications, DASH diet, exercise. Call with persistent abnormal home readings      Relevant Medications   lisinopril-hydrochlorothiazide (ZESTORETIC) 20-25 MG tablet     Endocrine   Type 2 diabetes mellitus without complication, without long-term current use of insulin (HCC) (Chronic)   Relevant Medications   metFORMIN (GLUCOPHAGE) 1000 MG tablet   lisinopril-hydrochlorothiazide (ZESTORETIC) 20-25 MG tablet    Other Visit Diagnoses    BV (bacterial vaginosis)       Restart flagyl, recommended probiotics, boric acid suppositories, good hygiene practices. F/u for retesting if not resolving   Relevant Medications   metroNIDAZOLE (FLAGYL) 500 MG tablet       Follow up plan: Return in about 5 months (around 12/13/2020) for 6 month f/u.

## 2020-07-15 LAB — HM DIABETES EYE EXAM

## 2020-07-15 NOTE — Assessment & Plan Note (Signed)
Improved, WNL. Continue current medications, DASH diet, exercise. Call with persistent abnormal home readings

## 2020-07-26 ENCOUNTER — Other Ambulatory Visit: Payer: Self-pay | Admitting: Family Medicine

## 2020-07-26 DIAGNOSIS — I10 Essential (primary) hypertension: Secondary | ICD-10-CM

## 2020-08-28 ENCOUNTER — Other Ambulatory Visit: Payer: Self-pay | Admitting: Family Medicine

## 2020-10-02 ENCOUNTER — Other Ambulatory Visit: Payer: Self-pay | Admitting: Family Medicine

## 2020-10-07 ENCOUNTER — Other Ambulatory Visit: Payer: Self-pay | Admitting: *Deleted

## 2020-10-07 DIAGNOSIS — Z87891 Personal history of nicotine dependence: Secondary | ICD-10-CM

## 2020-10-07 DIAGNOSIS — Z122 Encounter for screening for malignant neoplasm of respiratory organs: Secondary | ICD-10-CM

## 2020-10-07 NOTE — Progress Notes (Signed)
Contacted and scheduled for lung screening scan, patient is a former smoker, quit 11/20/09, 60 pack year history.

## 2020-10-24 ENCOUNTER — Telehealth (INDEPENDENT_AMBULATORY_CARE_PROVIDER_SITE_OTHER): Payer: BC Managed Care – PPO | Admitting: Family Medicine

## 2020-10-24 ENCOUNTER — Encounter: Payer: Self-pay | Admitting: Family Medicine

## 2020-10-24 DIAGNOSIS — Z20822 Contact with and (suspected) exposure to covid-19: Secondary | ICD-10-CM

## 2020-10-24 DIAGNOSIS — N3000 Acute cystitis without hematuria: Secondary | ICD-10-CM

## 2020-10-24 DIAGNOSIS — J0100 Acute maxillary sinusitis, unspecified: Secondary | ICD-10-CM | POA: Diagnosis not present

## 2020-10-24 MED ORDER — DOXYCYCLINE HYCLATE 100 MG PO TABS: 100.0000 mg | ORAL_TABLET | Freq: Two times a day (BID) | ORAL | 0 refills | Status: DC

## 2020-10-24 NOTE — Progress Notes (Signed)
There were no vitals taken for this visit.   Subjective:    Patient ID: Meredith Butler, female    DOB: 19-Feb-1956, 64 y.o.   MRN: [id number]  HPI: Meredith Butler is a 64 y.o. female  Chief Complaint  Patient presents with  . Sinusitis    For past week,  . Red eyes    about a week, yellow drainage, nad have been matted shut in morning  . painful urination    Painful when urinationg   UPPER RESPIRATORY TRACT INFECTION- has not been covid tested Duration: about a week Worst symptom: congestion, headache  Fever: no- 99.1 Cough: no Shortness of breath: yes Wheezing: no Chest pain: no Chest tightness: no Chest congestion: no Nasal congestion: yes Runny nose: yes Post nasal drip: yes Sneezing: no Sore throat: no Swollen glands: no Sinus pressure: yes Headache: yes Face pain: no Toothache: yes Ear pain: yes "right Ear pressure: yes bilateral Eyes red/itching:yes Eye drainage/crusting: yes  Vomiting: no Rash: no Fatigue: yes Sick contacts: no Strep contacts: no  Context: worse Recurrent sinusitis: no Relief with OTC cold/cough medications: no  Treatments attempted: tylenol   URINARY SYMPTOMS Duration: about a week Dysuria: yes Urinary frequency: yes Urgency: yes Small volume voids: no Symptom severity: no Urinary incontinence: yes Foul odor: yes Hematuria: no Abdominal pain: yes Back pain: yes Suprapubic pain/pressure: no Flank pain: no Fever:  no Vomiting: no Relief with cranberry juice: not taken Relief with pyridium: no Status: stable Previous urinary tract infection: yes Recurrent urinary tract infection: no Vaginal discharge: yes Treatments attempted: pyridium and increasing fluids     Relevant past medical, surgical, family and social history reviewed and updated as indicated. Interim medical history since our last visit reviewed. Allergies and medications reviewed and updated.  Review of Systems  Constitutional: Negative.   HENT: Positive for  congestion, ear pain, postnasal drip, rhinorrhea and sinus pressure. Negative for dental problem, drooling, ear discharge, facial swelling, hearing loss, mouth sores, nosebleeds, sinus pain, sneezing, sore throat, tinnitus, trouble swallowing and voice change.   Respiratory: Positive for shortness of breath. Negative for apnea, cough, choking, chest tightness, wheezing and stridor.   Cardiovascular: Negative.   Gastrointestinal: Negative.   Genitourinary: Positive for dysuria, frequency, urgency and vaginal discharge. Negative for decreased urine volume, difficulty urinating, dyspareunia, enuresis, flank pain, genital sores, hematuria, menstrual problem, pelvic pain, vaginal bleeding and vaginal pain.  Neurological: Negative.   Psychiatric/Behavioral: Negative.     Per HPI unless specifically indicated above     Objective:    There were no vitals taken for this visit.  Wt Readings from Last 3 Encounters:  07/13/20 232 lb (105.2 kg)  06/15/20 (!) 241 lb (109.3 kg)  02/23/20 237 lb (107.5 kg)    Physical Exam Vitals and nursing note reviewed.  Pulmonary:     Effort: Pulmonary effort is normal. No respiratory distress.     Comments: Speaking in full sentences Neurological:     Mental Status: She is alert.  Psychiatric:        Mood and Affect: Mood normal.        Behavior: Behavior normal.        Thought Content: Thought content normal.        Judgment: Judgment normal.     Results for orders placed or performed in visit on 06/15/20  WET [place], YEAST, CLUE   Specimen: Vaginal Fluid   Vaginal Flui  Result Value Ref Range   Trichomonas Exam Negative Negative  Yeast Exam Negative Negative   Clue Cell Exam Positive (A) Negative  Microscopic Examination   Urine  Result Value Ref Range   WBC, UA None seen 0 - 5 /hpf   RBC 0-2 0 - 2 /hpf   Epithelial Cells (non renal) 0-10 0 - 10 /hpf   Bacteria, UA Few (A) None seen/Few  UA/M w/rflx Culture, Routine   Specimen:  Urine   Urine  Result Value Ref Range   Specific Gravity, UA 1.010 1.005 - 1.030   pH, UA 5.5 5.0 - 7.5   Color, UA Yellow Yellow   Appearance Ur Clear Clear   Leukocytes,UA Negative Negative   Protein,UA Negative Negative/Trace   Glucose, UA 1+ (A) Negative   Ketones, UA Negative Negative   RBC, UA Trace (A) Negative   Bilirubin, UA Negative Negative   Urobilinogen, Ur 0.2 0.2 - 1.0 mg/dL   Nitrite, UA Negative Negative   Microscopic Examination See below:   Comprehensive metabolic panel  Result Value Ref Range   Glucose 193 (H) 65 - 99 mg/dL   BUN 10 8 - 27 mg/dL   Creatinine, Ser 0.71 0.57 - 1.00 mg/dL   GFR calc non Af Amer 90 >59 mL/min/1.73   GFR calc Af Amer 104 >59 mL/min/1.73   BUN/Creatinine Ratio 14 12 - 28   Sodium 138 134 - 144 mmol/L   Potassium 4.5 3.5 - 5.2 mmol/L   Chloride 98 96 - 106 mmol/L   CO2 25 20 - 29 mmol/L   Calcium 9.4 8.7 - 10.3 mg/dL   Total Protein 7.0 6.0 - 8.5 g/dL   Albumin 4.3 3.8 - 4.8 g/dL   Globulin, Total 2.7 1.5 - 4.5 g/dL   Albumin/Globulin Ratio 1.6 1.2 - 2.2   Bilirubin Total 0.9 0.0 - 1.2 mg/dL   Alkaline Phosphatase 78 48 - 121 IU/L   AST 21 0 - 40 IU/L   ALT 31 0 - 32 IU/L  Lipid Panel w/o Chol/HDL Ratio  Result Value Ref Range   Cholesterol, Total 148 100 - 199 mg/dL   Triglycerides 204 (H) 0 - 149 mg/dL   HDL 49 >39 mg/dL   VLDL Cholesterol Cal 33 5 - 40 mg/dL   LDL Chol Calc (NIH) 66 0 - 99 mg/dL  HgB A1c  Result Value Ref Range   Hgb A1c MFr Bld 7.3 (H) 4.8 - 5.6 %   Est. average glucose Bld gHb Est-mCnc 163 mg/dL      Assessment & Plan:   Problem List Items Addressed This Visit    None    Visit Diagnoses    Suspected COVID-19 virus infection    -  Primary   Will get her swabbed. Self-quarantine until results are back. Continue to monitor.    Relevant Orders   Novel Coronavirus, NAA ([facility])   Acute non-recurrent maxillary sinusitis       Will treat with doxycycline. Await covid results. Call if not  getting better or getting worse.    Relevant Medications   doxycycline (VIBRA-TABS) 100 MG tablet   Acute cystitis without hematuria       Will treat empirically with doxycycline. Call if not getting better- will need UA with culture at that point. Continue to monitor. Call with any concerns.       Follow up plan: Return if symptoms worsen or fail to improve.   . This visit was completed via telephone due to the restrictions of the COVID-19 pandemic. All issues as above were  discussed and addressed but no physical exam was performed. If it was felt that the patient should be evaluated in the office, they were directed there. The patient verbally consented to this visit. Patient was unable to complete an audio/visual visit due to Lack of equipment. Due to the catastrophic nature of the COVID-19 pandemic, this visit was done through audio contact only. . Location of the patient: home . Location of the provider: work . Those involved with this call:  . Provider: Park Liter, DO . CMA: Frazier Butt, CMA . Front Desk/Registration: Jill Side  . Time spent on call: 25 minutes on the phone discussing health concerns. 40 minutes total spent in review of patient's record and preparation of their chart.

## 2020-10-25 ENCOUNTER — Ambulatory Visit
Admission: RE | Admit: 2020-10-25 | Discharge: 2020-10-25 | Disposition: A | Discharge status: Home / Self Care | Payer: BC Managed Care – PPO | Source: Ambulatory Visit | Attending: Nurse Practitioner | Admitting: Nurse Practitioner

## 2020-10-25 ENCOUNTER — Other Ambulatory Visit: Payer: Self-pay

## 2020-10-25 DIAGNOSIS — Z122 Encounter for screening for malignant neoplasm of respiratory organs: Secondary | ICD-10-CM | POA: Diagnosis present

## 2020-10-25 DIAGNOSIS — Z87891 Personal history of nicotine dependence: Secondary | ICD-10-CM | POA: Insufficient documentation

## 2020-10-25 NOTE — Addendum Note (Signed)
Addended by: Gerrit Halls L on: 10/25/2020 10:58 AM   Modules accepted: Orders

## 2020-10-26 LAB — SARS-COV-2, NAA 2 DAY TAT

## 2020-10-26 LAB — NOVEL CORONAVIRUS, NAA: SARS-CoV-2, NAA: NOT DETECTED

## 2020-10-31 ENCOUNTER — Encounter: Payer: Self-pay | Admitting: *Deleted

## 2020-11-17 ENCOUNTER — Other Ambulatory Visit: Payer: Self-pay | Admitting: Family Medicine

## 2020-11-17 NOTE — Telephone Encounter (Signed)
Requested medication (s) are due for refill today: yes  Requested medication (s) are on the active medication list: yes  Last refill:  08/12/19  #90  1 refill  Future visit scheduled: yes with Jolene Cannady  Notes to clinic:  Not delegated    Requested Prescriptions  Pending Prescriptions Disp Refills   cyclobenzaprine (FLEXERIL) 10 MG tablet [Pharmacy Med Name: CYCLOBENZAPRINE 10 MG TABLET] 90 tablet 1    Sig: TAKE 1 TABLET BY MOUTH THREE TIMES A DAY AS NEEDED FOR MUSCLE SPASMS      Not Delegated - Analgesics:  Muscle Relaxants Failed - 11/17/2020 10:59 AM      Failed - This refill cannot be delegated      Passed - Valid encounter within last 6 months    Recent Outpatient Visits           3 weeks ago Suspected COVID-19 virus infection   [facility] [place], [place], DO   4 months ago Essential hypertension, benign   [facility] [place], [place], [place]   5 months ago Viral URI   [facility] [place], [place], [place]   8 months ago Depression, major, single episode, moderate [facility])   [facility] [place], [place]   9 months ago Rash   [facility] [place], [place], [place]       Future Appointments             In 3 weeks Cannady, Dorie Rank, NP [employer], [facility]

## 2020-11-27 ENCOUNTER — Other Ambulatory Visit: Payer: Self-pay | Admitting: Nurse Practitioner

## 2020-12-09 ENCOUNTER — Encounter: Payer: Self-pay | Admitting: Nurse Practitioner

## 2020-12-09 DIAGNOSIS — I152 Hypertension secondary to endocrine disorders: Secondary | ICD-10-CM | POA: Insufficient documentation

## 2020-12-09 DIAGNOSIS — J432 Centrilobular emphysema: Secondary | ICD-10-CM | POA: Insufficient documentation

## 2020-12-13 ENCOUNTER — Encounter: Payer: Self-pay | Admitting: Nurse Practitioner

## 2020-12-13 ENCOUNTER — Ambulatory Visit: Payer: BC Managed Care – PPO | Admitting: Nurse Practitioner

## 2020-12-13 ENCOUNTER — Telehealth: Payer: Self-pay

## 2020-12-13 ENCOUNTER — Other Ambulatory Visit: Payer: Self-pay

## 2020-12-13 DIAGNOSIS — E1169 Type 2 diabetes mellitus with other specified complication: Secondary | ICD-10-CM

## 2020-12-13 DIAGNOSIS — Z1231 Encounter for screening mammogram for malignant neoplasm of breast: Secondary | ICD-10-CM

## 2020-12-13 DIAGNOSIS — I700 Atherosclerosis of aorta: Secondary | ICD-10-CM

## 2020-12-13 DIAGNOSIS — R7989 Other specified abnormal findings of blood chemistry: Secondary | ICD-10-CM

## 2020-12-13 DIAGNOSIS — E785 Hyperlipidemia, unspecified: Secondary | ICD-10-CM

## 2020-12-13 DIAGNOSIS — J432 Centrilobular emphysema: Secondary | ICD-10-CM | POA: Diagnosis not present

## 2020-12-13 DIAGNOSIS — R350 Frequency of micturition: Secondary | ICD-10-CM

## 2020-12-13 DIAGNOSIS — E1159 Type 2 diabetes mellitus with other circulatory complications: Secondary | ICD-10-CM | POA: Diagnosis not present

## 2020-12-13 DIAGNOSIS — I209 Angina pectoris, unspecified: Secondary | ICD-10-CM

## 2020-12-13 DIAGNOSIS — E538 Deficiency of other specified B group vitamins: Secondary | ICD-10-CM

## 2020-12-13 DIAGNOSIS — I152 Hypertension secondary to endocrine disorders: Secondary | ICD-10-CM

## 2020-12-13 DIAGNOSIS — Z87891 Personal history of nicotine dependence: Secondary | ICD-10-CM

## 2020-12-13 LAB — WET PREP FOR TRICH, YEAST, CLUE
Clue Cell Exam: POSITIVE — AB
Trichomonas Exam: NEGATIVE
Yeast Exam: NEGATIVE

## 2020-12-13 LAB — URINALYSIS, ROUTINE W REFLEX MICROSCOPIC
Bilirubin, UA: NEGATIVE
Glucose, UA: NEGATIVE
Ketones, UA: NEGATIVE
Nitrite, UA: NEGATIVE
Protein,UA: NEGATIVE
RBC, UA: NEGATIVE
Specific Gravity, UA: 1.015 (ref 1.005–1.030)
Urobilinogen, Ur: 1 mg/dL (ref 0.2–1.0)
pH, UA: 7 (ref 5.0–7.5)

## 2020-12-13 LAB — MICROALBUMIN, URINE WAIVED
Creatinine, Urine Waived: 50 mg/dL (ref 10–300)
Microalb, Ur Waived: 30 mg/L — ABNORMAL HIGH (ref 0–19)

## 2020-12-13 LAB — MICROSCOPIC EXAMINATION
Bacteria, UA: NONE SEEN
RBC, Urine: NONE SEEN /hpf (ref 0–2)

## 2020-12-13 LAB — BAYER DCA HB A1C WAIVED: HB A1C (BAYER DCA - WAIVED): 6.8 % (ref <7.0)

## 2020-12-13 MED ORDER — METRONIDAZOLE 500 MG PO TABS: 500.0000 mg | ORAL_TABLET | Freq: Two times a day (BID) | ORAL | 0 refills | Status: AC

## 2020-12-13 NOTE — Assessment & Plan Note (Signed)
BMI 37.04.  Recommended eating smaller high protein, low fat meals more frequently and exercising 30 mins a day 5 times a week with a goal of 10-15lb weight loss in the next 3 months. Patient voiced their understanding and motivation to adhere to these recommendations.

## 2020-12-13 NOTE — Assessment & Plan Note (Addendum)
History of smoking, quit 11 years ago.  Centrilobular emphysema noted on CT lung screening. Currently using Albuterol 2-3 times a day, found benefit from [facility] but could not afford.  Referral to CCM team for assistance as patient needs maintenance inhaler on board.  Spirometry next visit to get baseline function.  Recommend continued cessation of smoking.  In future may want to change Lisinopril to Losartan due to underlying COPD. Return in 3 months.

## 2020-12-13 NOTE — Assessment & Plan Note (Signed)
Chronic, ongoing.  A1c downward trend today to 6.8% and urine ALB 30.  Continue current medication regimen and adjust as needed, monitor kidney function closely with Metformin use.  Continue Lisinopril for kidney protection.  Recommend she check BS daily and document for visits + focus on diet changes.  Return in 3 months.

## 2020-12-13 NOTE — Assessment & Plan Note (Signed)
Ongoing issue, not using NTG and was unsure when to use.  Provided at length education to her today on when to use NTG and different types of chest pain.  If more GERD related recommend Pepcid as needed, but if more heart related educated her on how to use NTG and when to call EMS.  She was able to verbalize this back.  Continue collaboration with cardiology and if ongoing episodes CP have return for follow-up with them.

## 2020-12-13 NOTE — Chronic Care Management (AMB) (Unsigned)
  Care Management   Outreach Note  12/13/2020 Name: Meredith Butler MRN: [id number] DOB: 26-Feb-1956  Referred by: Volney American, PA-C Reason for referral : Care Coordination (Outreach to schedule referral for Pharm D )   Meredith Butler is enrolled in a Managed Medicaid Health Plan: No  An unsuccessful telephone outreach was attempted today. The patient was referred to the case management team for assistance with care management and care coordination.   Follow Up Plan: A HIPAA compliant phone message was left for the patient providing contact information and requesting a return call.  The care management team will reach out to the patient again over the next 2 days.  If patient returns call to provider office, please advise to call [facility] [place], [address], [place], [place] [address] Direct Dial: [phone] [name]

## 2020-12-13 NOTE — Assessment & Plan Note (Addendum)
Chronic, ongoing with initial BP elevated and repeat close to goal + home BP often at goal with occasional elevations.  Continue Lisinopril and HCTZ, may benefit from low dose Amlodipine in future if elevations above goal, but would monitor for worsening edema closely.  In future may want to change Lisinopril to Losartan due to underlying COPD. Recommend she monitor BP at least a few mornings a week at home and document.  DASH diet at home.  Continue current medication regimen and adjust as needed.  Labs today: BMP and TSH.  Return in 3 months.

## 2020-12-13 NOTE — Assessment & Plan Note (Signed)
Recommend continued cessation and continue annual lung CT screening.

## 2020-12-13 NOTE — Assessment & Plan Note (Deleted)
Noted on lung screening 10/25/20.  Recommend continue ASA daily and Zetia.  Recommend continued cessation of smoking.

## 2020-12-13 NOTE — Assessment & Plan Note (Signed)
Noted on lung screening 10/25/20.  Recommend continue ASA daily and Zetia.  Recommend continued cessation of smoking. 

## 2020-12-13 NOTE — Progress Notes (Signed)
BP 130/86   Pulse 80   Temp 97.9 F (36.6 C) (Oral)   Ht 5' 7.91" (1.725 m)   Wt 243 lb (110.2 kg)   SpO2 98%   BMI 37.04 kg/m    Subjective:    Patient ID: Meredith Butler, female    DOB: 1956/08/30, 65 y.o.   MRN: [id number]  HPI: Meredith Butler is a 65 y.o. female  Chief Complaint  Patient presents with  . Diabetes  . Hypertension  . Urinary Frequency   DIABETES Continues on Metformin with last A1c 7.3% in July Hypoglycemic episodes:no Polydipsia/polyuria: yes Visual disturbance: no Chest pain: no Paresthesias: no Glucose Monitoring: yes  Accucheck frequency: daily when possible  Fasting glucose: 110 on Sunday  Post prandial:  Evening:  Before meals: Taking Insulin?: no  Long acting insulin:  Short acting insulin: Blood Pressure Monitoring: daily Retinal Examination: Up to Date -- this January at [place] Exam: Up to Date Pneumovax: refused Influenza: refused Aspirin: yes   HYPERTENSION / HYPERLIPIDEMIA Continues on Lisinopril, HCTZ, Zetia, ASA, and NTG.  Followed cardiology 01/25/20.  Does endorse some occasional chest pain, not using NTG.  Stressors increase this. Satisfied with current treatment? yes Duration of hypertension: chronic BP monitoring frequency: daily BP range: 125/86 on Friday -- 120-150/80-100 -- coffee elevates numbers BP medication side effects: no Duration of hyperlipidemia: chronic Cholesterol medication side effects: no Cholesterol supplements: none Medication compliance: good compliance Aspirin: yes Recent stressors: yes Recurrent headaches: occasionally -- twice a week Visual changes: occasional with reading Palpitations: no Dyspnea: no Chest pain: occasional  Lower extremity edema: occasional -- baseline Dizzy/lightheaded: occasional   COPD Continues on Albuterol.  Last lung CA screening 10/25/20 -- noting emphysema and aortic atherosclerosis. Was ordered Breo, but was too expensive -- worked well, breathed better. Quit smoking  11 years ago. COPD status: stable Satisfied with current treatment?: yes Oxygen use: no Dyspnea frequency: occasional Cough frequency: none Rescue inhaler frequency:  2-3 times a day Limitation of activity: no Productive cough:  Last Spirometry: unknown Pneumovax: refused Influenza: refused  URINARY SYMPTOMS Started this morning, has history of UTI frequently in past.  Was last treated 10/24/20 -- treated with Doxycycline. Dysuria: no Urinary frequency: yes Urgency: yes Small volume voids: yes Symptom severity: yes Urinary incontinence: no Foul odor: no Hematuria: no Abdominal pain: yes Back pain: no Suprapubic pain/pressure: no Flank pain: no Fever:  no Vomiting: no Status: stable Previous urinary tract infection: yes Recurrent urinary tract infection: no Sexual activity: No sexually active History of sexually transmitted disease: no Treatments attempted: increasing fluids   Relevant past medical, surgical, family and social history reviewed and updated as indicated. Interim medical history since our last visit reviewed. Allergies and medications reviewed and updated.  Review of Systems  Constitutional: Negative for activity change, appetite change, diaphoresis, fatigue and fever.  Respiratory: Positive for shortness of breath (occasional, baseline). Negative for cough, chest tightness and wheezing.   Cardiovascular: Positive for chest pain (occasional, uses ASA and goes away, not using NTG) and leg swelling (baseline). Negative for palpitations.  Gastrointestinal: Negative.   Endocrine: Positive for polyphagia and polyuria. Negative for cold intolerance, heat intolerance and polydipsia.  Genitourinary: Positive for frequency and urgency. Negative for dysuria, hematuria, pelvic pain and vaginal discharge.  Neurological: Negative.   Psychiatric/Behavioral: Negative.     Per HPI unless specifically indicated above     Objective:    BP 130/86   Pulse 80   Temp  97.9 F (36.6 C) (  Oral)   Ht 5' 7.91" (1.725 m)   Wt 243 lb (110.2 kg)   SpO2 98%   BMI 37.04 kg/m   Wt Readings from Last 3 Encounters:  12/13/20 243 lb (110.2 kg)  10/25/20 232 lb (105.2 kg)  07/13/20 232 lb (105.2 kg)    Physical Exam Vitals and nursing note reviewed.  Constitutional:      General: She is awake. She is not in acute distress.    Appearance: She is well-developed and well-groomed. She is morbidly obese. She is not ill-appearing.  HENT:     Head: Normocephalic.     Right Ear: Hearing normal.     Left Ear: Hearing normal.  Eyes:     General: Lids are normal.        Right eye: No discharge.        Left eye: No discharge.     Conjunctiva/sclera: Conjunctivae normal.     Pupils: Pupils are equal, round, and reactive to light.  Neck:     Thyroid: No thyromegaly.     Vascular: No carotid bruit.  Cardiovascular:     Rate and Rhythm: Normal rate and regular rhythm.     Heart sounds: Normal heart sounds. No murmur heard. No gallop.   Pulmonary:     Effort: Pulmonary effort is normal. No accessory muscle usage or respiratory distress.     Breath sounds: Normal breath sounds.  Abdominal:     General: Bowel sounds are normal.     Palpations: Abdomen is soft. There is no hepatomegaly.     Tenderness: There is no abdominal tenderness. There is no right CVA tenderness or left CVA tenderness.  Musculoskeletal:     Cervical back: Normal range of motion and neck supple.     Right lower leg: No edema.     Left lower leg: No edema.  Lymphadenopathy:     Cervical: No cervical adenopathy.  Skin:    General: Skin is warm and dry.  Neurological:     Mental Status: She is alert and oriented to person, place, and time.     Deep Tendon Reflexes: Reflexes are normal and symmetric.  Psychiatric:        Attention and Perception: Attention normal.        Mood and Affect: Mood normal.        Speech: Speech normal.        Behavior: Behavior normal. Behavior is cooperative.         Thought Content: Thought content normal.     Results for orders placed or performed in visit on 10/24/20  Novel Coronavirus, NAA ([facility])   Specimen: Saline  Result Value Ref Range   SARS-CoV-2, NAA Not Detected Not Detected  SARS-COV-2, NAA 2 DAY TAT  Result Value Ref Range   SARS-CoV-2, NAA 2 DAY TAT Performed       Assessment & Plan:   Problem List Items Addressed This Visit      Cardiovascular and Mediastinum   Aortic atherosclerosis ([place])    Noted on lung screening 10/25/20.  Recommend continue ASA daily and Zetia.  Recommend continued cessation of smoking.      Angina pectoris ([place])    Ongoing issue, not using NTG and was unsure when to use.  Provided at length education to her today on when to use NTG and different types of chest pain.  If more GERD related recommend Pepcid as needed, but if more heart related educated her on how to use NTG  and when to call EMS.  She was able to verbalize this back.  Continue collaboration with cardiology and if ongoing episodes CP have return for follow-up with them.      Relevant Orders   AMB Referral to Community Care Coordinaton   Hypertension associated with diabetes ([place])    Chronic, ongoing with initial BP elevated and repeat close to goal + home BP often at goal with occasional elevations.  Continue Lisinopril and HCTZ, may benefit from low dose Amlodipine in future if elevations above goal, but would monitor for worsening edema closely.  In future may want to change Lisinopril to Losartan due to underlying COPD. Recommend she monitor BP at least a few mornings a week at home and document.  DASH diet at home.  Continue current medication regimen and adjust as needed.  Labs today: BMP and TSH.  Return in 3 months.       Relevant Orders   Bayer DCA Hb A1c Waived   Basic metabolic panel   Microalbumin, Urine Waived   TSH   AMB Referral to Community Care Coordinaton     Respiratory   Centrilobular emphysema ([place])    History  of smoking, quit 11 years ago.  Centrilobular emphysema noted on CT lung screening. Currently using Albuterol 2-3 times a day, found benefit from [facility] but could not afford.  Referral to CCM team for assistance as patient needs maintenance inhaler on board.  Spirometry next visit to get baseline function.  Recommend continued cessation of smoking.  In future may want to change Lisinopril to Losartan due to underlying COPD. Return in 3 months.      Relevant Orders   AMB Referral to [facility] Coordinaton     Endocrine   Type 2 diabetes mellitus with morbid obesity ([place]) - Primary    Chronic, ongoing.  A1c downward trend today to 6.8% and urine ALB 30.  Continue current medication regimen and adjust as needed, monitor kidney function closely with Metformin use.  Continue Lisinopril for kidney protection.  Recommend she check BS daily and document for visits + focus on diet changes.  Return in 3 months.        Relevant Orders   Bayer DCA Hb A1c Waived   AMB Referral to [facility] Coordinaton   Hyperlipidemia associated with type 2 diabetes mellitus (HCC)    Chronic, ongoing.  Continue current medication regimen and adjust as needed.  Lipid panel today.        Relevant Orders   Bayer DCA Hb A1c Waived   Lipid Panel w/o Chol/HDL Ratio     Other   Elevated TSH    Recheck TSH today on labs.      Ex-smoker    Recommend continued cessation and continue annual lung CT screening.      Morbid obesity (HCC)    BMI 37.04.  Recommended eating smaller high protein, low fat meals more frequently and exercising 30 mins a day 5 times a week with a goal of 10-15lb weight loss in the next 3 months. Patient voiced their understanding and motivation to adhere to these recommendations.       Urinary frequency    Acute with history of frequent urine infections, UA today noting 1+ LEUK and 6-10 WBC.  Wet prep + clue cells and negative yeast and trich.  At this time educated her on bacterial  vaginosis and script sent for Flagyl 500 MG BID x 7 days.  Suspect some underlying atrophic vaginitis.  Relevant Orders   Urinalysis, Routine w reflex microscopic   WET PREP FOR TRICH, YEAST, CLUE    Other Visit Diagnoses    B12 deficiency       History of low levels reported, check today and start supplement as needed.   Relevant Orders   Vitamin B12   Encounter for screening mammogram for malignant neoplasm of breast       Mammogram ordered.   Relevant Orders   MM Digital Diagnostic Bilat       Follow up plan: Return in about 3 months (around 03/13/2021) for T2DM, HTN/HLD, COPD -- with spirometry.

## 2020-12-13 NOTE — Assessment & Plan Note (Signed)
Chronic, ongoing.  Continue current medication regimen and adjust as needed. Lipid panel today. 

## 2020-12-13 NOTE — Assessment & Plan Note (Signed)
Acute with history of frequent urine infections, UA today noting 1+ LEUK and 6-10 WBC.  Wet prep + clue cells and negative yeast and trich.  At this time educated her on bacterial vaginosis and script sent for Flagyl 500 MG BID x 7 days.  Suspect some underlying atrophic vaginitis.

## 2020-12-13 NOTE — Assessment & Plan Note (Signed)
Recheck TSH today on labs.

## 2020-12-13 NOTE — Patient Instructions (Addendum)
Think about Cologuard and flu vaccine and pneumonia vaccine.   Diabetes Mellitus and Nutrition, Adult When you have diabetes, or diabetes mellitus, it is very important to have healthy eating habits because your blood sugar (glucose) levels are greatly affected by what you eat and drink. Eating healthy foods in the right amounts, at about the same times every day, can help you:  Control your blood glucose.  Lower your risk of heart disease.  Improve your blood pressure.  Reach or maintain a healthy weight. What can affect my meal plan? Every person with diabetes is different, and each person has different needs for a meal plan. Your health care provider may recommend that you work with a dietitian to make a meal plan that is best for you. Your meal plan may vary depending on factors such as:  The calories you need.  The medicines you take.  Your weight.  Your blood glucose, blood pressure, and cholesterol levels.  Your activity level.  Other health conditions you have, such as heart or kidney disease. How do carbohydrates affect me? Carbohydrates, also called carbs, affect your blood glucose level more than any other type of food. Eating carbs naturally raises the amount of glucose in your blood. Carb counting is a method for keeping track of how many carbs you eat. Counting carbs is important to keep your blood glucose at a healthy level, especially if you use insulin or take certain oral diabetes medicines. It is important to know how many carbs you can safely have in each meal. This is different for every person. Your dietitian can help you calculate how many carbs you should have at each meal and for each snack. How does alcohol affect me? Alcohol can cause a sudden decrease in blood glucose (hypoglycemia), especially if you use insulin or take certain oral diabetes medicines. Hypoglycemia can be a life-threatening condition. Symptoms of hypoglycemia, such as sleepiness, dizziness,  and confusion, are similar to symptoms of having too much alcohol.  Do not drink alcohol if: ? Your health care provider tells you not to drink. ? You are pregnant, may be pregnant, or are planning to become pregnant.  If you drink alcohol: ? Do not drink on an empty stomach. ? Limit how much you use to:  0-1 drink a day for women.  0-2 drinks a day for men. ? Be aware of how much alcohol is in your drink. In the [place] one drink equals one 12 oz bottle of beer (355 mL), one 5 oz glass of wine (148 mL), or one 1 oz glass of hard liquor (44 mL). ? Keep yourself hydrated with water, diet soda, or unsweetened iced tea.  Keep in mind that regular soda, juice, and other mixers may contain a lot of sugar and must be counted as carbs. What are tips for following this plan? Reading food labels  Start by checking the serving size on the "Nutrition Facts" label of packaged foods and drinks. The amount of calories, carbs, fats, and other nutrients listed on the label is based on one serving of the item. Many items contain more than one serving per package.  Check the total grams (g) of carbs in one serving. You can calculate the number of servings of carbs in one serving by dividing the total carbs by 15. For example, if a food has 30 g of total carbs per serving, it would be equal to 2 servings of carbs.  Check the number of grams (g) of saturated  fats and trans fats in one serving. Choose foods that have a low amount or none of these fats.  Check the number of milligrams (mg) of salt (sodium) in one serving. Most people should limit total sodium intake to less than 2,300 mg per day.  Always check the nutrition information of foods labeled as "low-fat" or "nonfat." These foods may be higher in added sugar or refined carbs and should be avoided.  Talk to your dietitian to identify your daily goals for nutrients listed on the label. Shopping  Avoid buying canned, pre-made, or processed foods.  These foods tend to be high in fat, sodium, and added sugar.  Shop around the outside edge of the grocery store. This is where you will most often find fresh fruits and vegetables, bulk grains, fresh meats, and fresh dairy. Cooking  Use low-heat cooking methods, such as baking, instead of high-heat cooking methods like deep frying.  Cook using healthy oils, such as olive, canola, or sunflower oil.  Avoid cooking with butter, cream, or high-fat meats. Meal planning  Eat meals and snacks regularly, preferably at the same times every day. Avoid going long periods of time without eating.  Eat foods that are high in fiber, such as fresh fruits, vegetables, beans, and whole grains. Talk with your dietitian about how many servings of carbs you can eat at each meal.  Eat 4-6 oz (112-168 g) of lean protein each day, such as lean meat, chicken, fish, eggs, or tofu. One ounce (oz) of lean protein is equal to: ? 1 oz (28 g) of meat, chicken, or fish. ? 1 egg. ?  cup (62 g) of tofu.  Eat some foods each day that contain healthy fats, such as avocado, nuts, seeds, and fish.   What foods should I eat? Fruits Berries. Apples. Oranges. Peaches. Apricots. Plums. Grapes. Mango. Papaya. Pomegranate. Kiwi. Cherries. Vegetables Lettuce. Spinach. Leafy greens, including kale, chard, collard greens, and mustard greens. Beets. Cauliflower. Cabbage. Broccoli. Carrots. Green beans. Tomatoes. Peppers. Onions. Cucumbers. Brussels sprouts. Grains Whole grains, such as whole-wheat or whole-grain bread, crackers, tortillas, cereal, and pasta. Unsweetened oatmeal. Quinoa. Brown or wild rice. Meats and other proteins Seafood. Poultry without skin. Lean cuts of poultry and beef. Tofu. Nuts. Seeds. Dairy Low-fat or fat-free dairy products such as milk, yogurt, and cheese. The items listed above may not be a complete list of foods and beverages you can eat. Contact a dietitian for more information. What foods should I  avoid? Fruits Fruits canned with syrup. Vegetables Canned vegetables. Frozen vegetables with butter or cream sauce. Grains Refined white flour and flour products such as bread, pasta, snack foods, and cereals. Avoid all processed foods. Meats and other proteins Fatty cuts of meat. Poultry with skin. Breaded or fried meats. Processed meat. Avoid saturated fats. Dairy Full-fat yogurt, cheese, or milk. Beverages Sweetened drinks, such as soda or iced tea. The items listed above may not be a complete list of foods and beverages you should avoid. Contact a dietitian for more information. Questions to ask a health care provider  Do I need to meet with a diabetes educator?  Do I need to meet with a dietitian?  What number can I call if I have questions?  When are the best times to check my blood glucose? Where to find more information:  American Diabetes Association: diabetes.org  Academy of Nutrition and Dietetics: www.eatright.[employer] of Diabetes and Digestive and Kidney Diseases: [employer]  Association of Diabetes Care and Education  Specialists: www.diabeteseducator.org Summary  It is important to have healthy eating habits because your blood sugar (glucose) levels are greatly affected by what you eat and drink.  A healthy meal plan will help you control your blood glucose and maintain a healthy lifestyle.  Your health care provider may recommend that you work with a dietitian to make a meal plan that is best for you.  Keep in mind that carbohydrates (carbs) and alcohol have immediate effects on your blood glucose levels. It is important to count carbs and to use alcohol carefully. This information is not intended to replace advice given to you by your health care provider. Make sure you discuss any questions you have with your health care provider. Document Revised: 10/13/2019 Document Reviewed: 10/13/2019 Elsevier Patient Education  2021 [employer].

## 2020-12-14 LAB — BASIC METABOLIC PANEL
BUN/Creatinine Ratio: 15 (ref 12–28)
BUN: 13 mg/dL (ref 8–27)
CO2: 26 mmol/L (ref 20–29)
Calcium: 9.9 mg/dL (ref 8.7–10.3)
Chloride: 98 mmol/L (ref 96–106)
Creatinine, Ser: 0.88 mg/dL (ref 0.57–1.00)
GFR calc Af Amer: 80 mL/min/{1.73_m2} (ref >59)
GFR calc non Af Amer: 70 mL/min/{1.73_m2} (ref >59)
Glucose: 113 mg/dL — ABNORMAL HIGH (ref 65–99)
Potassium: 4.3 mmol/L (ref 3.5–5.2)
Sodium: 139 mmol/L (ref 134–144)

## 2020-12-14 LAB — LIPID PANEL W/O CHOL/HDL RATIO
Cholesterol, Total: 170 mg/dL (ref 100–199)
HDL: 44 mg/dL (ref >39)
LDL Chol Calc (NIH): 89 mg/dL (ref 0–99)
Triglycerides: 221 mg/dL — ABNORMAL HIGH (ref 0–149)
VLDL Cholesterol Cal: 37 mg/dL (ref 5–40)

## 2020-12-14 LAB — VITAMIN B12: Vitamin B-12: 462 pg/mL (ref 232–1245)

## 2020-12-14 LAB — TSH: TSH: 2.7 u[IU]/mL (ref 0.450–4.500)

## 2020-12-14 NOTE — Progress Notes (Signed)
Good morning, please let Meredith Butler know her labs have returned.  Kidney function remains stable.  Cholesterol levels stable, continue Zetia -- but for better control in future we could consider adding and injectable cholesterol medication that has less side effects then statins, which she tolerated poorly in past, and offers better control.  This is called Repatha.  Thyroid and B12 level are normal.  Continue all current medications.  If any questions let me know.  Have a great day!! Keep being awesome!!  Thank you for allowing me to participate in your care. Kindest regards, Gary Gabrielsen

## 2020-12-14 NOTE — Chronic Care Management (AMB) (Signed)
  Care Management   Note  12/14/2020 Name: Meredith Butler MRN: [id number] DOB: 1956-09-23  Meredith Butler is a 65 y.o. year old female who is a primary care patient of [name], NP. I reached out to Otelia Sergeant by phone today in response to a referral sent by Ms. Meredith Butler PCP [name], NP.    Meredith Butler was given information about care management services today including:  1. Care management services include personalized support from designated clinical staff supervised by her physician, including individualized plan of care and coordination with other care providers 2. 24/7 contact phone numbers for assistance for urgent and routine care needs. 3. The patient may stop care management services at any time by phone call to the office staff.  Patient agreed to services and verbal consent obtained.   Follow up plan: Telephone appointment with care management team member scheduled for:12/21/2020  Noreene Larsson, [place], [place], [place] [address] Direct Dial: [phone] [email or web address]

## 2020-12-20 ENCOUNTER — Telehealth: Payer: Self-pay | Admitting: Pharmacist

## 2020-12-20 NOTE — Progress Notes (Addendum)
Chronic Care Management Pharmacy Assistant   Name: Meredith Butler  MRN: [id number] DOB: 12-21-1955  Reason for Encounter: Appointment reminder and initial questions for appointment on 12-21-2020.   PCP : [name], NP  Allergies:   Allergies  Allergen Reactions   Claforan [Cefotaxime] Anaphylaxis   Ibuprofen Anaphylaxis   Omnicef [Cefdinir] Anaphylaxis   Penicillins Anaphylaxis    Has patient had a PCN reaction causing immediate rash, facial/tongue/throat swelling, SOB or lightheadedness with hypotension: Yes Has patient had a PCN reaction causing severe rash involving mucus membranes or skin necrosis: No Has patient had a PCN reaction that required hospitalization: Yes Has patient had a PCN reaction occurring within the last 10 years: Yes If all of the above answers are "NO", then may proceed with Cephalosporin use.    Sulfa Antibiotics Anaphylaxis and Shortness Of Breath   Tetanus Toxoids Anaphylaxis   Voltaren [Diclofenac Sodium] Anaphylaxis   Codeine     Makes her crazy    Diovan [Valsartan] Swelling   Rocephin [Ceftriaxone]     Eyes turn blood red    Statins     myalgias    Medications: Outpatient Encounter Medications as of 12/20/2020  Medication Sig   albuterol (VENTOLIN HFA) 108 (90 Base) MCG/ACT inhaler Inhale 2 puffs into the lungs every 6 (six) hours as needed for wheezing or shortness of breath.   aspirin EC 81 MG tablet Take 1 tablet (81 mg total) by mouth daily. (Patient taking differently: Take 81 mg by mouth every evening.)   cetirizine (ZYRTEC) 10 MG tablet Take 1 tablet (10 mg total) by mouth daily.   cyclobenzaprine (FLEXERIL) 10 MG tablet TAKE 1 TABLET BY MOUTH THREE TIMES A DAY AS NEEDED FOR MUSCLE SPASMS   doxycycline (VIBRA-TABS) 100 MG tablet Take 1 tablet (100 mg total) by mouth 2 (two) times daily. (Patient not taking: Reported on 12/13/2020)   EPINEPHRINE 0.3 mg/0.3 mL IJ SOAJ injection INJECT 0.3 MLS (0.3 MG TOTAL) INTO THE MUSCLE AS NEEDED FOR  ANAPHYLAXIS.   ezetimibe (ZETIA) 10 MG tablet TAKE 1 TABLET BY MOUTH EVERY DAY   fluticasone (FLONASE) 50 MCG/ACT nasal spray Place 2 sprays into both nostrils 2 (two) times daily as needed for allergies or rhinitis. (Patient not taking: No sig reported)   fluticasone furoate-vilanterol (BREO ELLIPTA) 200-25 MCG/INH AEPB Inhale 1 puff into the lungs daily. (Patient not taking: Reported on 12/13/2020)   glucose blood test strip Use as instructed   lisinopril (ZESTRIL) 20 MG tablet TAKE 1 TABLET BY MOUTH EVERY DAY   lisinopril-hydrochlorothiazide (ZESTORETIC) 20-25 MG tablet Take 1 tablet by mouth daily.   Magnesium 250 MG TABS Take 250 mg by mouth daily as needed (leg cramps.).    metFORMIN (GLUCOPHAGE) 1000 MG tablet Take 1 tablet (1,000 mg total) by mouth 2 (two) times daily with a meal.   metroNIDAZOLE (FLAGYL) 500 MG tablet Take 1 tablet (500 mg total) by mouth 2 (two) times daily for 7 days.   nitroGLYCERIN (NITROSTAT) 0.4 MG SL tablet Place 1 tablet (0.4 mg total) under the tongue every 5 (five) minutes as needed for chest pain.   No facility-administered encounter medications on file as of 12/20/2020.    Current Diagnosis: Patient Active Problem List   Diagnosis Date Noted   Urinary frequency 12/13/2020   Centrilobular emphysema ([place]) 12/09/2020   Hypertension associated with diabetes ([place]) 12/09/2020   Myalgia due to statin 06/18/2020   Allergic rhinitis 01/21/2020   Angina pectoris ([employer]) 12/28/2019   Ex-smoker  12/28/2019   Morbid obesity () 12/28/2019   Aortic atherosclerosis ([place]) 10/13/2018   Type 2 diabetes mellitus with morbid obesity ([place]) 12/24/2017   Hyperlipidemia associated with type 2 diabetes mellitus ([place]) 12/24/2017   Fibromyalgia 12/24/2017   Polyarthralgia 12/24/2017   Elevated TSH 12/24/2017   12/20/2020:   Any changes in your medications or health?  Patient reported she is unable to afford her daily inhaler due to a copy of $ 50.00.   Any side effects  from any medications?  The patient reported that at times in the late afternoon around 3 pm or 4 pm she experiences "lightheadedness". She reported when these events occur she does not check her blood pressure or blood sugars. She also reported she does eat breakfast, lunch, and dinner. Explained she might have these "lightheaded periods" twice a week. She reported that these events do not occur when changing positions from sitting to standing. Encouraged the patient to check her blood sugar and blood pressure when these "lightheaded" periods occur. She verbalized understanding.   Do you have an symptoms or problems not managed by your medications?  None noted or reported.   Any concerns about your health right now?  The patient expressed she is concerned about the co-pay related to her inhaler. During phone call she was unable to tell me the name of the medication.   Has your provider asked that you check blood pressure, blood sugar, or follow special diet at home?  The patient checks her blood sugars daily every morning before meals. She reported her blood sugars are ranging from 130 to 160. She attempts to follow the DASH diet.   Do you get any type of exercise on a regular basis? No   Can you think of a goal you would like to reach for your health?  During the phone call she could not think of a goal.   Do you have any problems getting your medications?  She reported she was having a hard time affording her co-pay of 50.00 for her daily inhaler.   Is there anything that you would like to discuss during the appointment?  The patient could not think of any specific topics she would want to discuss with the PharmD.   Called the patient to remind her of her upcoming appointment with PharmD Birdena Crandall on 12-21-2020 at 2:30 pm. Performed initial questions with the patient. The patient explained she was having a hard time affording her daily inhaler due to a co-pay of $50.00. She did express she  has been using her daily inhaler as prescribed.   She checks her blood pressure daily with readings ranging from 118-140 over 70-90. Reported she checks her blood pressure daily about 30 minutes after waking up.   She checks her blood sugars once daily before breakfast. Reported readings between 120-150. She also explained she has not experienced any blood sugars at or below 70.   The patient reported that at times she experiences "lightheadedness". She continued to explain these periods occur maybe twice weekly. They usually occur during the late afternoon hours between 3-5 pm. During these events, she does not check her blood pressure or blood sugars. Also reported these periods do not occur after changing positions. Stated it might last between 2-3 minutes. Encouraged the patient to check her blood pressure and blood sugar the next time these events occur. She verbalized understanding to information given.   Cloretta Ned, LPN Clinical Pharmacist Assistant  [phone]  Follow-Up:  Scheduled Follow-Up With Clinical Pharmacist  I have reviewed the care management and care coordination activities outlined in this encounter and I am certifying that I agree with the content of this note.  Mercer Pod. Tiburcio Pea PharmD, BCPS Clinical Pharmacist [facility] [phone]

## 2020-12-21 ENCOUNTER — Ambulatory Visit: Payer: BC Managed Care – PPO

## 2020-12-22 ENCOUNTER — Telehealth: Payer: Self-pay | Admitting: Pharmacist

## 2020-12-23 ENCOUNTER — Telehealth: Payer: BC Managed Care – PPO

## 2020-12-25 NOTE — Progress Notes (Signed)
Chronic Care Management Pharmacy Assistant   Name: Meredith Butler  MRN: [id number] DOB: [birth year]  Reason for Encounter: Patient assistance application/Breo    PCP : [name], NP  Allergies:   Allergies  Allergen Reactions  . Claforan [Cefotaxime] Anaphylaxis  . Ibuprofen Anaphylaxis  . Omnicef [Cefdinir] Anaphylaxis  . Penicillins Anaphylaxis    Has patient had a PCN reaction causing immediate rash, facial/tongue/throat swelling, SOB or lightheadedness with hypotension: Yes Has patient had a PCN reaction causing severe rash involving mucus membranes or skin necrosis: No Has patient had a PCN reaction that required hospitalization: Yes Has patient had a PCN reaction occurring within the last 10 years: Yes If all of the above answers are "NO", then may proceed with Cephalosporin use.   . Sulfa Antibiotics Anaphylaxis and Shortness Of Breath  . Tetanus Toxoids Anaphylaxis  . Voltaren [Diclofenac Sodium] Anaphylaxis  . Codeine     Makes her crazy   . Diovan [Valsartan] Swelling  . Rocephin [Ceftriaxone]     Eyes turn blood red   . Statins     myalgias    Medications: Outpatient Encounter Medications as of 12/22/2020  Medication Sig  . albuterol (VENTOLIN HFA) 108 (90 Base) MCG/ACT inhaler Inhale 2 puffs into the lungs every 6 (six) hours as needed for wheezing or shortness of breath.  Marland Kitchen aspirin EC 81 MG tablet Take 1 tablet (81 mg total) by mouth daily. (Patient taking differently: Take 81 mg by mouth every evening.)  . cetirizine (ZYRTEC) 10 MG tablet Take 1 tablet (10 mg total) by mouth daily.  . cyclobenzaprine (FLEXERIL) 10 MG tablet TAKE 1 TABLET BY MOUTH THREE TIMES A DAY AS NEEDED FOR MUSCLE SPASMS  . doxycycline (VIBRA-TABS) 100 MG tablet Take 1 tablet (100 mg total) by mouth 2 (two) times daily. (Patient not taking: Reported on 12/13/2020)  . EPINEPHRINE 0.3 mg/0.3 mL IJ SOAJ injection INJECT 0.3 MLS (0.3 MG TOTAL) INTO THE MUSCLE AS NEEDED FOR ANAPHYLAXIS.  Marland Kitchen  ezetimibe (ZETIA) 10 MG tablet TAKE 1 TABLET BY MOUTH EVERY DAY  . fluticasone (FLONASE) 50 MCG/ACT nasal spray Place 2 sprays into both nostrils 2 (two) times daily as needed for allergies or rhinitis. (Patient not taking: No sig reported)  . fluticasone furoate-vilanterol (BREO ELLIPTA) 200-25 MCG/INH AEPB Inhale 1 puff into the lungs daily. (Patient not taking: Reported on 12/13/2020)  . glucose blood test strip Use as instructed  . lisinopril (ZESTRIL) 20 MG tablet TAKE 1 TABLET BY MOUTH EVERY DAY  . lisinopril-hydrochlorothiazide (ZESTORETIC) 20-25 MG tablet Take 1 tablet by mouth daily.  . Magnesium 250 MG TABS Take 250 mg by mouth daily as needed (leg cramps.).   Marland Kitchen metFORMIN (GLUCOPHAGE) 1000 MG tablet Take 1 tablet (1,000 mg total) by mouth 2 (two) times daily with a meal.   No facility-administered encounter medications on file as of 12/22/2020.    Current Diagnosis: Patient Active Problem List   Diagnosis Date Noted  . Urinary frequency 12/13/2020  . Centrilobular emphysema ([place]) 12/09/2020  . Hypertension associated with diabetes ([place]) 12/09/2020  . Myalgia due to statin 06/18/2020  . Allergic rhinitis 01/21/2020  . Angina pectoris ([place]) 12/28/2019  . Ex-smoker 12/28/2019  . Morbid obesity ([place]) 12/28/2019  . Aortic atherosclerosis ([place]) 10/13/2018  . Type 2 diabetes mellitus with morbid obesity ([place]) 12/24/2017  . Hyperlipidemia associated with type 2 diabetes mellitus ([place]) 12/24/2017  . Fibromyalgia 12/24/2017  . Polyarthralgia 12/24/2017  . Elevated TSH 12/24/2017   Patient assistance application  prefilled for [facility]. Highlighted sections the patient is needing to complete. Also provided typed instructions of how to complete and what the patient needs to provide. Mailed the application to the patient's address on file.  Cloretta Ned, LPN Clinical Pharmacist Assistant   [phone]    Follow-Up:  Patient Assistance Coordination

## 2020-12-29 ENCOUNTER — Other Ambulatory Visit: Payer: Self-pay | Admitting: Nurse Practitioner

## 2020-12-30 ENCOUNTER — Telehealth: Payer: BC Managed Care – PPO

## 2021-01-04 ENCOUNTER — Ambulatory Visit: Payer: BC Managed Care – PPO | Admitting: Pharmacist

## 2021-01-16 NOTE — Progress Notes (Signed)
Chronic Care Management   Note  01/16/2021 Name: Meredith Butler MRN: [id number] DOB: 01-May-1956  Reason for referral: Medication assistaance  Referral source: PCP Referral medication(s): Breo Ellipta Current insurance:BCBS  PMHx: History of smoking, quit 11 years ago. Centrilobular emphysema noted on CT lung screening. Currently using Albuterol 2-3 times a day, found benefit from [facility] but could not afford      Objective: Allergies  Allergen Reactions  . Claforan [Cefotaxime] Anaphylaxis  . Ibuprofen Anaphylaxis  . Omnicef [Cefdinir] Anaphylaxis  . Penicillins Anaphylaxis    Has patient had a PCN reaction causing immediate rash, facial/tongue/throat swelling, SOB or lightheadedness with hypotension: Yes Has patient had a PCN reaction causing severe rash involving mucus membranes or skin necrosis: No Has patient had a PCN reaction that required hospitalization: Yes Has patient had a PCN reaction occurring within the last 10 years: Yes If all of the above answers are "NO", then may proceed with Cephalosporin use.   . Sulfa Antibiotics Anaphylaxis and Shortness Of Breath  . Tetanus Toxoids Anaphylaxis  . Voltaren [Diclofenac Sodium] Anaphylaxis  . Codeine     Makes her crazy   . Diovan [Valsartan] Swelling  . Rocephin [Ceftriaxone]     Eyes turn blood red   . Statins     myalgias    Medications Reviewed Today    Reviewed by Venita Lick, NP (Nurse Practitioner) on 12/13/20 at 1140  Med List Status: <None>  Medication Order Taking? Sig Documenting Provider Last Dose Status Informant  albuterol (VENTOLIN HFA) 108 (90 Base) MCG/ACT inhaler [id number] Yes Inhale 2 puffs into the lungs every 6 (six) hours as needed for wheezing or shortness of breath. Volney American, [place] Taking Active Self  aspirin EC 81 MG tablet [id number] Yes Take 1 tablet (81 mg total) by mouth daily.  Patient taking differently: Take 81 mg by mouth every evening.   Revankar, Reita Cliche, MD Taking  Active Self  cetirizine (ZYRTEC) 10 MG tablet [id number] Yes Take 1 tablet (10 mg total) by mouth daily. Volney American, [place] Taking Active   cyclobenzaprine (FLEXERIL) 10 MG tablet [id number] Yes TAKE 1 TABLET BY MOUTH THREE TIMES A DAY AS NEEDED FOR MUSCLE SPASMS Johnson, Megan P, DO Taking Active   doxycycline (VIBRA-TABS) 100 MG tablet [id number] No Take 1 tablet (100 mg total) by mouth 2 (two) times daily.  Patient not taking: Reported on 12/13/2020   Valerie Roys, DO Not Taking Active   EPINEPHRINE 0.3 mg/0.3 mL IJ SOAJ injection [id number] Yes INJECT 0.3 MLS (0.3 MG TOTAL) INTO THE MUSCLE AS NEEDED FOR ANAPHYLAXIS. Volney American, [place] Taking Active   ezetimibe (ZETIA) 10 MG tablet [id number] Yes TAKE 1 TABLET BY MOUTH EVERY DAY Cannady, Jolene T, NP Taking Active   fluticasone (FLONASE) 50 MCG/ACT nasal spray [id number] No Place 2 sprays into both nostrils 2 (two) times daily as needed for allergies or rhinitis.  Patient not taking: No sig reported   Volney American, PA-C Not Taking Active   fluticasone furoate-vilanterol (BREO ELLIPTA) 200-25 MCG/INH AEPB [id number] No Inhale 1 puff into the lungs daily.  Patient not taking: Reported on 12/13/2020   Volney American, [place] Not Taking Active   glucose blood test strip [id number] Yes Use as instructed Kathrine Haddock, NP Taking Active Self  lisinopril (ZESTRIL) 20 MG tablet [id number] Yes TAKE 1 TABLET BY MOUTH EVERY DAY Cannady, Jolene T, NP Taking Active   lisinopril-hydrochlorothiazide (ZESTORETIC) 20-25 MG tablet [id number] Yes Take 1 tablet by  mouth daily. Volney American, [place] Taking Active   Magnesium 250 MG TABS [id number] Yes Take 250 mg by mouth daily as needed (leg cramps.).  [provider] Taking Active Self  metFORMIN (GLUCOPHAGE) 1000 MG tablet [id number] Yes Take 1 tablet (1,000 mg total) by mouth 2 (two) times daily with a meal. Volney American, PA-C Taking Active   nitroGLYCERIN  (NITROSTAT) 0.4 MG SL tablet [id number]  Place 1 tablet (0.4 mg total) under the tongue every 5 (five) minutes as needed for chest pain. Jenean Lindau, MD  Expired 03/27/20 2359 Self          Assessment: Patient unable to afford copay   Medication Review Findings:  . Patient without maintenance inhaler for several months  Medication Assistance Findings:  Extra Help:   _0  Already receiving Full Extra Help  _1  Already receiving Partial Extra Help  _2  Eligible based on reported income and assets  _3  Not Eligible based on reported income and assets  Patient Assistance Programs: 1) Breo Ellipta made by [place] requirement met: _4  Yes _5  No _6  Unknown o Out-of-pocket prescription expenditure met:    _7  Yes _8  No  _9  Unknown  <MMITVIFXGXIVHSJW>_9<\/GRMBOBOFPULGSPJS>_41  Not applicable - $991 OOP required by [place]        2)  Breztri made by [employer] o Income requirement met: _11  Yes _12  No  _13  Unknown o Out-of-pocket prescription expenditure met:   _14  Yes _15  No   _16  Unknown <ACQPEAKLTYVDPBAQ>_5<\/OHCSPZZCKICHTVGV>_02  Not applicable - [place] no longer requires OOP and have one month vouchers. Could pursue if PCP agrees with triple therapy   Additional medication assistance options reviewed with patient as warranted:  [employer], Tier Exception request, Visual merchandiser     Plan: Patient is to provide additional documents as necessary.   Follow up:  2 weeks with CPA  Junita Push. Kenton Kingfisher PharmD, [place] [facility] [phone]

## 2021-02-02 ENCOUNTER — Other Ambulatory Visit: Payer: Self-pay | Admitting: Family Medicine

## 2021-02-02 DIAGNOSIS — E119 Type 2 diabetes mellitus without complications: Secondary | ICD-10-CM

## 2021-02-02 NOTE — Telephone Encounter (Signed)
Requested Prescriptions  Pending Prescriptions Disp Refills  . metFORMIN (GLUCOPHAGE) 1000 MG tablet [Pharmacy Med Name: METFORMIN HCL 1,000 MG TABLET] 180 tablet 1    Sig: TAKE 1 TABLET (1,000 MG TOTAL) BY MOUTH 2 (TWO) TIMES DAILY WITH A MEAL.     Endocrinology:  Diabetes - Biguanides Passed - 02/02/2021  1:35 AM      Passed - Cr in normal range and within 360 days    Creatinine, Ser  Date Value Ref Range Status  12/13/2020 0.88 0.57 - 1.00 mg/dL Final         Passed - HBA1C is between 0 and 7.9 and within 180 days    HB A1C (BAYER DCA - WAIVED)  Date Value Ref Range Status  12/13/2020 6.8 <7.0 % Final    Comment:                                          Diabetic Adult            <7.0                                       Healthy Adult        4.3 - 5.7                                                           (DCCT/NGSP) American Diabetes Association's Summary of Glycemic Recommendations for Adults with Diabetes: Hemoglobin A1c <7.0%. More stringent glycemic goals (A1c <6.0%) may further reduce complications at the cost of increased risk of hypoglycemia.          Passed - eGFR in normal range and within 360 days    GFR calc Af Amer  Date Value Ref Range Status  12/13/2020 80 >59 mL/min/1.73 Final    Comment:    **In accordance with recommendations from the NKF-ASN Task force,**   [facility] is in the process of updating its eGFR calculation to the   2021 CKD-EPI creatinine equation that estimates kidney function   without a race variable.    GFR calc non Af Amer  Date Value Ref Range Status  12/13/2020 70 >59 mL/min/1.73 Final         Passed - Valid encounter within last 6 months    Recent Outpatient Visits          1 month ago Type 2 diabetes mellitus with morbid obesity ([place])   [place], Irmo T, NP   3 months ago Suspected COVID-19 virus infection   [facility] [place], [place], DO   6 months ago Essential hypertension, benign    [place], [place], [place]   7 months ago Viral URI   [place], [place]   11 months ago Depression, major, single episode, moderate [facility])   [place], Lilia Argue, [place]      Future Appointments            In 1 month [name], NP [facility], [place]

## 2021-02-08 ENCOUNTER — Telehealth: Payer: Self-pay

## 2021-02-08 NOTE — Telephone Encounter (Signed)
1x per day

## 2021-02-08 NOTE — Telephone Encounter (Signed)
Patient requesting refill on one touch test strips sent to her pharmacy. Patient is scheduled for 03/20/21.

## 2021-02-08 NOTE — Telephone Encounter (Signed)
Ready for signature

## 2021-02-08 NOTE — Telephone Encounter (Signed)
How often does she test, I can get it written up for you to sign

## 2021-02-08 NOTE — Telephone Encounter (Signed)
Can we send the form to the pharmacy for one touch strips?

## 2021-02-13 ENCOUNTER — Telehealth: Payer: Self-pay | Admitting: Nurse Practitioner

## 2021-02-13 MED ORDER — BREO ELLIPTA 200-25 MCG/INH IN AEPB
1.0000 | INHALATION_SPRAY | Freq: Every day | RESPIRATORY_TRACT | 2 refills | Status: DC
Start: 2021-02-13 — End: 2021-03-23

## 2021-02-13 MED ORDER — ALBUTEROL SULFATE HFA 108 (90 BASE) MCG/ACT IN AERS: 2.0000 | INHALATION_SPRAY | Freq: Four times a day (QID) | RESPIRATORY_TRACT | 1 refills | Status: DC | PRN

## 2021-02-13 NOTE — Telephone Encounter (Signed)
Pt called asking if she could get a refill on her Breo inhaler and the Ventolin inhaler  [facility] [place].  CB#  [phone]

## 2021-02-13 NOTE — Telephone Encounter (Signed)
Routing to provider  

## 2021-02-14 NOTE — Telephone Encounter (Signed)
Called and LVM letting patient know that her medications were sent in for her.

## 2021-03-10 ENCOUNTER — Ambulatory Visit (INDEPENDENT_AMBULATORY_CARE_PROVIDER_SITE_OTHER): Payer: BC Managed Care – PPO | Admitting: Nurse Practitioner

## 2021-03-10 ENCOUNTER — Encounter: Payer: Self-pay | Admitting: Nurse Practitioner

## 2021-03-10 ENCOUNTER — Other Ambulatory Visit: Payer: Self-pay

## 2021-03-10 VITALS — BP 135/95

## 2021-03-10 DIAGNOSIS — J0100 Acute maxillary sinusitis, unspecified: Secondary | ICD-10-CM | POA: Insufficient documentation

## 2021-03-10 MED ORDER — PREDNISONE 10 MG PO TABS: ORAL_TABLET | ORAL | 0 refills | Status: DC

## 2021-03-10 MED ORDER — AZITHROMYCIN 250 MG PO TABS: ORAL_TABLET | ORAL | 0 refills | Status: AC

## 2021-03-10 NOTE — Assessment & Plan Note (Signed)
Has not taken covid-19 test or been vaccinated. She is unable to come to our office for a covid-19 test. Encouraged her to have her husband pick up a home test or go to a testing center. She can also come to our office at 11am or 4pm and we can give her a test. Will treat with zithromax and prednisone taper. Continue tylenol prn pain/fever. Can use saline nasal spray, encourage fluids and rest. Follow up if symptoms do not improve or worsen.

## 2021-03-10 NOTE — Progress Notes (Signed)
Acute Office Visit  Subjective:    Patient ID: Meredith Butler, female    DOB: 05-10-1956, 65 y.o.   MRN: [id number]  Chief Complaint  Patient presents with  . Headache    Pt states she has had a headache and fever for the last 3 days.     HPI Patient is in today for headache and fever for 3 days  UPPER RESPIRATORY TRACT INFECTION  Worst symptom: headache Fever: yes Cough: yes Shortness of breath: yes , with walking Wheezing: no Chest pain: no Chest tightness: no Chest congestion: no Nasal congestion: no Runny nose: no--dry nose Post nasal drip: no Sneezing: yes Sore throat: yes Swollen glands: no Sinus pressure: no Headache: yes Face pain: yes Toothache: no Ear pain: no  Ear pressure: yes bilateral Eyes red/itching:no Eye drainage/crusting: no  Vomiting: no Rash: no Fatigue: yes Sick contacts: no  Strep contacts: no  Context: stable Recurrent sinusitis: no Relief with OTC cold/cough medications: no  Treatments attempted: tylenol, aspirin    Past Medical History:  Diagnosis Date  . Asthma   . Diabetes mellitus without complication ([place])   . Fibromyalgia   . Hypertension   . Lupus ([place])   . Peritonitis [facility])     Past Surgical History:  Procedure Laterality Date  . ABDOMINAL HYSTERECTOMY     complete  . ABDOMINAL SURGERY    . APPENDECTOMY    . CHOLECYSTECTOMY    . COLON SURGERY    . JOINT REPLACEMENT Bilateral    knee  . LEFT HEART CATH AND CORONARY ANGIOGRAPHY N/A 01/05/2020   Procedure: LEFT HEART CATH AND CORONARY ANGIOGRAPHY;  Surgeon: Martinique, Peter M, MD;  Location: [place] CV [facility];  Service: Cardiovascular;  Laterality: N/A;  . SMALL INTESTINE SURGERY    . SPLENECTOMY, TOTAL    . TONSILLECTOMY      Family History  Problem Relation Age of Onset  . Heart attack Mother   . Cancer Mother   . Diabetes Mother   . Heart attack Father   . Diabetes Sister   . Diabetes Daughter   . Cancer Maternal Grandmother        ovarian  . Cancer  Paternal Grandfather        colon  . Cancer Sister        lung  . Diabetes Daughter   . Diabetes Maternal Aunt   . Diabetes Maternal Uncle     Social History   Socioeconomic History  . Marital status: Married    Spouse name: Not on file  . Number of children: Not on file  . Years of education: Not on file  . Highest education level: Not on file  Occupational History  . Not on file  Tobacco Use  . Smoking status: Former Smoker    Packs/day: 2.00    Years: 30.00    Pack years: 60.00    Types: Cigarettes    Quit date: 11/20/2009    Years since quitting: 11.3  . Smokeless tobacco: Never Used  Vaping Use  . Vaping Use: Never used  Substance and Sexual Activity  . Alcohol use: No  . Drug use: No  . Sexual activity: Yes  Other Topics Concern  . Not on file  Social History Narrative  . Not on file   Social Determinants of Health   Financial Resource Strain: Not on file  Food Insecurity: Not on file  Transportation Needs: Not on file  Physical Activity: Not on file  Stress: Not  on file  Social Connections: Not on file  Intimate Partner Violence: Not on file    Outpatient Medications Prior to Visit  Medication Sig Dispense Refill  . albuterol (VENTOLIN HFA) 108 (90 Base) MCG/ACT inhaler Inhale 2 puffs into the lungs every 6 (six) hours as needed for wheezing or shortness of breath. 18 g 1  . aspirin EC 81 MG tablet Take 1 tablet (81 mg total) by mouth daily. (Patient taking differently: Take 81 mg by mouth every evening.) 90 tablet 3  . cholecalciferol (VITAMIN D3) 25 MCG (1000 UNIT) tablet Take 1,000 Units by mouth daily.    . cyclobenzaprine (FLEXERIL) 10 MG tablet TAKE 1 TABLET BY MOUTH THREE TIMES A DAY AS NEEDED FOR MUSCLE SPASMS 30 tablet 1  . EPINEPHRINE 0.3 mg/0.3 mL IJ SOAJ injection INJECT 0.3 MLS (0.3 MG TOTAL) INTO THE MUSCLE AS NEEDED FOR ANAPHYLAXIS. 2 each 2  . ezetimibe (ZETIA) 10 MG tablet TAKE 1 TABLET BY MOUTH EVERY DAY 90 tablet 1  . fluticasone  furoate-vilanterol (BREO ELLIPTA) 200-25 MCG/INH AEPB Inhale 1 puff into the lungs daily. 60 each 2  . glucose blood test strip Use as instructed 100 each 12  . lisinopril (ZESTRIL) 20 MG tablet TAKE 1 TABLET BY MOUTH EVERY DAY 90 tablet 0  . lisinopril-hydrochlorothiazide (ZESTORETIC) 20-25 MG tablet Take 1 tablet by mouth daily. 90 tablet 1  . Magnesium 250 MG TABS Take 250 mg by mouth daily as needed (leg cramps.).     Marland Kitchen metFORMIN (GLUCOPHAGE) 1000 MG tablet TAKE 1 TABLET (1,000 MG TOTAL) BY MOUTH 2 (TWO) TIMES DAILY WITH A MEAL. 180 tablet 1  . cetirizine (ZYRTEC) 10 MG tablet Take 1 tablet (10 mg total) by mouth daily. (Patient not taking: Reported on 03/10/2021) 30 tablet 11  . nitroGLYCERIN (NITROSTAT) 0.4 MG SL tablet Place 1 tablet (0.4 mg total) under the tongue every 5 (five) minutes as needed for chest pain. 90 tablet 3  . doxycycline (VIBRA-TABS) 100 MG tablet Take 1 tablet (100 mg total) by mouth 2 (two) times daily. (Patient not taking: Reported on 12/13/2020) 20 tablet 0  . fluticasone (FLONASE) 50 MCG/ACT nasal spray Place 2 sprays into both nostrils 2 (two) times daily as needed for allergies or rhinitis. (Patient not taking: No sig reported) 16 g 6   No facility-administered medications prior to visit.    Allergies  Allergen Reactions  . Claforan [Cefotaxime] Anaphylaxis  . Ibuprofen Anaphylaxis  . Omnicef [Cefdinir] Anaphylaxis  . Penicillins Anaphylaxis    Has patient had a PCN reaction causing immediate rash, facial/tongue/throat swelling, SOB or lightheadedness with hypotension: Yes Has patient had a PCN reaction causing severe rash involving mucus membranes or skin necrosis: No Has patient had a PCN reaction that required hospitalization: Yes Has patient had a PCN reaction occurring within the last 10 years: Yes If all of the above answers are "NO", then may proceed with Cephalosporin use.   . Sulfa Antibiotics Anaphylaxis and Shortness Of Breath  . Tetanus Toxoids  Anaphylaxis  . Voltaren [Diclofenac Sodium] Anaphylaxis  . Codeine     Makes her crazy   . Diovan [Valsartan] Swelling  . Rocephin [Ceftriaxone]     Eyes turn blood red   . Statins     myalgias    Review of Systems  Constitutional: Positive for fatigue and fever.  HENT: Positive for ear pain, sinus pressure and sore throat. Negative for congestion, postnasal drip and rhinorrhea.   Eyes: Negative.   Respiratory: Positive  for cough.   Cardiovascular: Negative.   Gastrointestinal: Negative.   Genitourinary: Negative.   Skin: Negative.   Neurological: Positive for headaches.       Objective:    Physical Exam Vitals and nursing note reviewed.  Pulmonary:     Comments: Able to talk in complete sentences Neurological:     Mental Status: She is alert and oriented to person, place, and time.     BP (!) 135/95  Wt Readings from Last 3 Encounters:  12/13/20 243 lb (110.2 kg)  10/25/20 232 lb (105.2 kg)  07/13/20 232 lb (105.2 kg)    Health Maintenance Due  Topic Date Due  . Hepatitis C Screening  Never done  . FOOT EXAM  Never done  . HIV Screening  Never done  . MAMMOGRAM  Never done    There are no preventive care reminders to display for this patient.   Lab Results  Component Value Date   TSH 2.700 12/13/2020   Lab Results  Component Value Date   WBC 6.7 12/28/2019   HGB 14.9 12/28/2019   HCT 44.2 12/28/2019   MCV 90 12/28/2019   PLT 329 12/28/2019   Lab Results  Component Value Date   NA 139 12/13/2020   K 4.3 12/13/2020   CO2 26 12/13/2020   GLUCOSE 113 (H) 12/13/2020   BUN 13 12/13/2020   CREATININE 0.88 12/13/2020   BILITOT 0.9 06/15/2020   ALKPHOS 78 06/15/2020   AST 21 06/15/2020   ALT 31 06/15/2020   PROT 7.0 06/15/2020   ALBUMIN 4.3 06/15/2020   CALCIUM 9.9 12/13/2020   ANIONGAP 8 04/04/2017   Lab Results  Component Value Date   CHOL 170 12/13/2020   Lab Results  Component Value Date   HDL 44 12/13/2020   Lab Results   Component Value Date   LDLCALC 89 12/13/2020   Lab Results  Component Value Date   TRIG 221 (H) 12/13/2020   No results found for: CHOLHDL Lab Results  Component Value Date   HGBA1C 6.8 12/13/2020       Assessment & Plan:   Problem List Items Addressed This Visit      Respiratory   Acute non-recurrent maxillary sinusitis - Primary    Has not taken covid-19 test or been vaccinated. She is unable to come to our office for a covid-19 test. Encouraged her to have her husband pick up a home test or go to a testing center. She can also come to our office at 11am or 4pm and we can give her a test. Will treat with zithromax and prednisone taper. Continue tylenol prn pain/fever. Can use saline nasal spray, encourage fluids and rest. Follow up if symptoms do not improve or worsen.      Relevant Medications   azithromycin (ZITHROMAX) 250 MG tablet   predniSONE (DELTASONE) 10 MG tablet       Meds ordered this encounter  Medications  . azithromycin (ZITHROMAX) 250 MG tablet    Sig: Take 2 tablets on day 1, then 1 tablet daily on days 2 through 5    Dispense:  6 tablet    Refill:  0  . predniSONE (DELTASONE) 10 MG tablet    Sig: Take 6 tablets today, then 5 tablets tomorrow, then decrease by 1 tablet every day until gone    Dispense:  21 tablet    Refill:  0      . This visit was completed via telephone due to the restrictions of  the COVID-19 pandemic. All issues as above were discussed and addressed but no physical exam was performed. If it was felt that the patient should be evaluated in the office, they were directed there. The patient verbally consented to this visit. Patient was unable to complete an audio/visual visit due to Lack of equipment. Due to the catastrophic nature of the COVID-19 pandemic, this visit was done through audio contact only. . Location of the patient: home . Location of the provider: work . Those involved with this call:  . Provider: Billy Fischer,  DNP . CMA: Yvonna Alanis, CMA . Front Desk/Registration: Jill Side  . Time spent on call: 15 minutes on the phone discussing health concerns. 10 minutes total spent in review of patient's record and preparation of their chart.    Charyl Dancer, NP

## 2021-03-19 ENCOUNTER — Other Ambulatory Visit: Payer: Self-pay | Admitting: Family Medicine

## 2021-03-19 NOTE — Telephone Encounter (Signed)
Please review medications> Pt is on lisinopril 20 mg and also lisinopril/HCTZ in active med list.

## 2021-03-20 ENCOUNTER — Ambulatory Visit: Payer: BC Managed Care – PPO | Admitting: Nurse Practitioner

## 2021-03-20 NOTE — Telephone Encounter (Signed)
Appt scheuled 

## 2021-03-20 NOTE — Telephone Encounter (Signed)
Pt last apt on 03/10/2021 no f.u per chart per nurse "Please review medications> Pt is on lisinopril 20 mg and also lisinopril/HCTZ in active med list. " Would pt need apt?

## 2021-03-20 NOTE — Telephone Encounter (Signed)
Patient is due for follow up.  Please find out if she is taking Lisinopril or Lisinopril/HCTZ.  I will send her a 30 day supply.

## 2021-03-23 ENCOUNTER — Other Ambulatory Visit: Payer: Self-pay

## 2021-03-23 ENCOUNTER — Ambulatory Visit: Payer: BC Managed Care – PPO | Admitting: Nurse Practitioner

## 2021-03-23 ENCOUNTER — Encounter: Payer: Self-pay | Admitting: Nurse Practitioner

## 2021-03-23 DIAGNOSIS — M797 Fibromyalgia: Secondary | ICD-10-CM

## 2021-03-23 DIAGNOSIS — E1169 Type 2 diabetes mellitus with other specified complication: Secondary | ICD-10-CM

## 2021-03-23 DIAGNOSIS — I152 Hypertension secondary to endocrine disorders: Secondary | ICD-10-CM

## 2021-03-23 DIAGNOSIS — J432 Centrilobular emphysema: Secondary | ICD-10-CM | POA: Diagnosis not present

## 2021-03-23 DIAGNOSIS — E1159 Type 2 diabetes mellitus with other circulatory complications: Secondary | ICD-10-CM

## 2021-03-23 DIAGNOSIS — F339 Major depressive disorder, recurrent, unspecified: Secondary | ICD-10-CM | POA: Insufficient documentation

## 2021-03-23 MED ORDER — METFORMIN HCL 500 MG PO TABS: 500.0000 mg | ORAL_TABLET | Freq: Two times a day (BID) | ORAL | 3 refills | Status: DC

## 2021-03-23 MED ORDER — LISINOPRIL 20 MG PO TABS: 1.0000 | ORAL_TABLET | Freq: Every day | ORAL | 1 refills | Status: DC

## 2021-03-23 MED ORDER — ALBUTEROL SULFATE HFA 108 (90 BASE) MCG/ACT IN AERS: 2.0000 | INHALATION_SPRAY | Freq: Four times a day (QID) | RESPIRATORY_TRACT | 1 refills | Status: DC | PRN

## 2021-03-23 MED ORDER — NORTRIPTYLINE HCL 25 MG PO CAPS
25.0000 mg | ORAL_CAPSULE | Freq: Every day | ORAL | 1 refills | Status: DC
Start: 2021-03-23 — End: 2021-07-12

## 2021-03-23 MED ORDER — ANORO ELLIPTA 62.5-25 MCG/INH IN AEPB: 1.0000 | INHALATION_SPRAY | Freq: Every day | RESPIRATORY_TRACT | 2 refills | Status: DC

## 2021-03-23 NOTE — Patient Instructions (Addendum)
It was great to see you!  Your goal for your blood sugar in the morning is to be less than 130. If you are noticing multiple days above this, call our office. Take half a tablet of your metformin twice a day. If your blood sugar is lower than 70 or having symptoms of low blood sugar frequently (once a week), call our office. Voltaren gel can help with joint pain.   Let's follow-up in 4 weeks, sooner if you have concerns.  If a referral was placed today, you will be contacted for an appointment. Please note that routine referrals can sometimes take up to 3-4 weeks to process. Please call our office if you haven't heard anything after this time frame.  Take care,  [name], NP

## 2021-03-23 NOTE — Assessment & Plan Note (Addendum)
Blood pressure is elevated today. Has been checking her blood pressure at home and it has been 130s/80s. She said that it has gone up since she took the prednisone for her URI. Continue current regimen and monitoring BP daily at home. Refills sent to the pharmacy. Follow-up 1 month.

## 2021-03-23 NOTE — Assessment & Plan Note (Signed)
PHQ-9 11 today. She is tearful in the room. She has tried cymbalta in the past and didn't tolerate it. Will start nortriptyline at night. This can help with her mood and fibromyalgia pain. Currently denies SI/HI. Call our office or go to ER if develop SI/HI. Follow-up in 4 weeks.

## 2021-03-23 NOTE — Progress Notes (Signed)
Established Patient Office Visit  Subjective:  Patient ID: Meredith Butler, female    DOB: May 23, 1956  Age: 65 y.o. MRN: [id number]  CC:  Chief Complaint  Patient presents with  . Fibromyalgia    Pt states she thinks her fibromyalgia has been acting up, states she has been basically just sleepy for the last 2 weeks  . Hypertension  . Diabetes    HPI Meredith Butler presents for diabetes, hypertension, and worsening fibromyalgia pain.  DIABETES  Hypoglycemic episodes:yes, about once a week her sugars drop to the 60s. Polydipsia/polyuria: no Visual disturbance: no Chest pain: no Paresthesias: no Glucose Monitoring: yes  Accucheck frequency: Daily  Fasting glucose: 110s  Post prandial:  Evening:  Before meals: Taking Insulin?: no  Long acting insulin:  Short acting insulin: Blood Pressure Monitoring: daily Retinal Examination: Up to Date Foot Exam: Not up to Date Diabetic Education: Completed Pneumovax: unknown Influenza: Not up to Date Aspirin: no  FIBROMYALGIA  Pain status: exacerbated Satisfied with current treatment?: yes Medication side effects: no Medication compliance: excellent compliance Duration: last 2 weeks, her pain has been worse Location: joints, muscles, headache Quality: sore Current pain level: 6/10 Previous pain level: 2/10 Aggravating factors: none Alleviating factors: APAP Previous pain specialty evaluation: no Non-narcotic analgesic meds: no Narcotic contract:n/a Treatments attempted: APAP  Has tried gabapentin in the past, stopped because she didn't want to take so many pills.  DEPRESSION  Mood status: worse Satisfied with current treatment?: no Symptom severity: moderate  Duration of current treatment : months Side effects: no Medication compliance: excellent compliance Psychotherapy/counseling: no  Previous psychiatric medications: cymbalta--didn't like that it made her feel like a zombie Depressed mood: yes Anxious mood:  yes Anhedonia: no Significant weight loss or gain: no Insomnia: yes hard to fall asleep Fatigue: yes Feelings of worthlessness or guilt: no Impaired concentration/indecisiveness: no Suicidal ideations: no Hopelessness: no Crying spells: no Depression screen [facility] 2/9 03/23/2021 12/13/2020 10/24/2020 06/15/2020 02/23/2020  Decreased Interest 1 0 0 0 0  Down, Depressed, Hopeless 1 0 0 0 0  PHQ - 2 Score 2 0 0 0 0  Altered sleeping 3 0 0 1 0  Tired, decreased energy 3 0 3 1 0  Change in appetite 3 0 0 0 2  Feeling bad or failure about yourself  0 0 0 0 0  Trouble concentrating 0 0 0 0 0  Moving slowly or fidgety/restless 0 0 0 0 0  Suicidal thoughts 0 0 0 0 0  PHQ-9 Score 11 0 3 2 2   Difficult doing work/chores Not difficult at all - Not difficult at all - -  Some recent data might be hidden     Past Medical History:  Diagnosis Date  . Asthma   . Diabetes mellitus without complication ([place])   . Fibromyalgia   . Hypertension   . Lupus ([place])   . Peritonitis [facility])     Past Surgical History:  Procedure Laterality Date  . ABDOMINAL HYSTERECTOMY     complete  . ABDOMINAL SURGERY    . APPENDECTOMY    . CHOLECYSTECTOMY    . COLON SURGERY    . JOINT REPLACEMENT Bilateral    knee  . LEFT HEART CATH AND CORONARY ANGIOGRAPHY N/A 01/05/2020   Procedure: LEFT HEART CATH AND CORONARY ANGIOGRAPHY;  Surgeon: Martinique, Peter M, MD;  Location: [facility];  Service: Cardiovascular;  Laterality: N/A;  . SMALL INTESTINE SURGERY    . SPLENECTOMY, TOTAL    . TONSILLECTOMY  Family History  Problem Relation Age of Onset  . Heart attack Mother   . Cancer Mother   . Diabetes Mother   . Heart attack Father   . Diabetes Sister   . Diabetes Daughter   . Cancer Maternal Grandmother        ovarian  . Cancer Paternal Grandfather        colon  . Cancer Sister        lung  . Diabetes Daughter   . Diabetes Maternal Aunt   . Diabetes Maternal Uncle     Social History   Socioeconomic  History  . Marital status: Married    Spouse name: Not on file  . Number of children: Not on file  . Years of education: Not on file  . Highest education level: Not on file  Occupational History  . Not on file  Tobacco Use  . Smoking status: Former Smoker    Packs/day: 2.00    Years: 30.00    Pack years: 60.00    Types: Cigarettes    Quit date: 11/20/2009    Years since quitting: 11.3  . Smokeless tobacco: Never Used  Vaping Use  . Vaping Use: Never used  Substance and Sexual Activity  . Alcohol use: No  . Drug use: No  . Sexual activity: Yes  Other Topics Concern  . Not on file  Social History Narrative  . Not on file   Social Determinants of Health   Financial Resource Strain: Not on file  Food Insecurity: Not on file  Transportation Needs: Not on file  Physical Activity: Not on file  Stress: Not on file  Social Connections: Not on file  Intimate Partner Violence: Not on file    Outpatient Medications Prior to Visit  Medication Sig Dispense Refill  . aspirin EC 81 MG tablet Take 1 tablet (81 mg total) by mouth daily. (Patient taking differently: Take 81 mg by mouth every evening.) 90 tablet 3  . cetirizine (ZYRTEC) 10 MG tablet Take 1 tablet (10 mg total) by mouth daily. 30 tablet 11  . EPINEPHRINE 0.3 mg/0.3 mL IJ SOAJ injection INJECT 0.3 MLS (0.3 MG TOTAL) INTO THE MUSCLE AS NEEDED FOR ANAPHYLAXIS. 2 each 2  . ezetimibe (ZETIA) 10 MG tablet TAKE 1 TABLET BY MOUTH EVERY DAY 90 tablet 1  . glucose blood test strip Use as instructed 100 each 12  . lisinopril-hydrochlorothiazide (ZESTORETIC) 20-25 MG tablet TAKE 1 TABLET BY MOUTH EVERY DAY 90 tablet 1  . Magnesium 250 MG TABS Take 250 mg by mouth daily as needed (leg cramps.).     Marland Kitchen albuterol (VENTOLIN HFA) 108 (90 Base) MCG/ACT inhaler Inhale 2 puffs into the lungs every 6 (six) hours as needed for wheezing or shortness of breath. 18 g 1  . fluticasone furoate-vilanterol (BREO ELLIPTA) 200-25 MCG/INH AEPB Inhale 1  puff into the lungs daily. 60 each 2  . lisinopril (ZESTRIL) 20 MG tablet TAKE 1 TABLET BY MOUTH EVERY DAY 90 tablet 0  . metFORMIN (GLUCOPHAGE) 1000 MG tablet TAKE 1 TABLET (1,000 MG TOTAL) BY MOUTH 2 (TWO) TIMES DAILY WITH A MEAL. 180 tablet 1  . nitroGLYCERIN (NITROSTAT) 0.4 MG SL tablet Place 1 tablet (0.4 mg total) under the tongue every 5 (five) minutes as needed for chest pain. 90 tablet 3  . cholecalciferol (VITAMIN D3) 25 MCG (1000 UNIT) tablet Take 1,000 Units by mouth daily.    . cyclobenzaprine (FLEXERIL) 10 MG tablet TAKE 1 TABLET BY MOUTH THREE  TIMES A DAY AS NEEDED FOR MUSCLE SPASMS (Patient not taking: Reported on 03/23/2021) 30 tablet 1  . predniSONE (DELTASONE) 10 MG tablet Take 6 tablets today, then 5 tablets tomorrow, then decrease by 1 tablet every day until gone 21 tablet 0   No facility-administered medications prior to visit.    Allergies  Allergen Reactions  . Claforan [Cefotaxime] Anaphylaxis  . Ibuprofen Anaphylaxis  . Omnicef [Cefdinir] Anaphylaxis  . Penicillins Anaphylaxis    Has patient had a PCN reaction causing immediate rash, facial/tongue/throat swelling, SOB or lightheadedness with hypotension: Yes Has patient had a PCN reaction causing severe rash involving mucus membranes or skin necrosis: No Has patient had a PCN reaction that required hospitalization: Yes Has patient had a PCN reaction occurring within the last 10 years: Yes If all of the above answers are "NO", then may proceed with Cephalosporin use.   . Sulfa Antibiotics Anaphylaxis and Shortness Of Breath  . Tetanus Toxoids Anaphylaxis  . Voltaren [Diclofenac Sodium] Anaphylaxis  . Codeine     Makes her crazy   . Diovan [Valsartan] Swelling  . Rocephin [Ceftriaxone]     Eyes turn blood red   . Statins     myalgias    ROS Review of Systems  Constitutional: Positive for fatigue. Negative for fever.  HENT: Positive for congestion and mouth sores (with Breo). Negative for ear pain, postnasal  drip and rhinorrhea.   Eyes: Negative.   Respiratory: Negative.   Cardiovascular: Negative.   Gastrointestinal: Positive for constipation (intermittent) and diarrhea (intermittent). Negative for abdominal pain.  Genitourinary: Negative.   Musculoskeletal: Positive for arthralgias and myalgias.  Neurological: Positive for dizziness (when sugar drops).  Psychiatric/Behavioral:       Depression, family member has cancer      Objective:    Physical Exam Vitals and nursing note reviewed.  Constitutional:      General: She is not in acute distress.    Appearance: Normal appearance.  HENT:     Head: Normocephalic and atraumatic.     Mouth/Throat:     Mouth: Mucous membranes are moist.     Pharynx: Oropharynx is clear.  Eyes:     Conjunctiva/sclera: Conjunctivae normal.  Neck:     Vascular: No carotid bruit.  Cardiovascular:     Rate and Rhythm: Normal rate and regular rhythm.     Pulses: Normal pulses.     Heart sounds: Normal heart sounds.  Pulmonary:     Effort: Pulmonary effort is normal.     Breath sounds: Normal breath sounds.  Abdominal:     Palpations: Abdomen is soft.     Tenderness: There is no abdominal tenderness.  Musculoskeletal:     Cervical back: Normal range of motion.  Skin:    General: Skin is warm and dry.  Neurological:     General: No focal deficit present.     Mental Status: She is alert and oriented to person, place, and time.  Psychiatric:        Mood and Affect: Mood normal. Affect is tearful.        Behavior: Behavior normal.        Thought Content: Thought content normal.        Judgment: Judgment normal.     BP (!) 153/100   Pulse 86   Temp 98.2 F (36.8 C) (Oral)   Wt 238 lb 3.2 oz (108 kg)   SpO2 96%   BMI 36.31 kg/m  Wt Readings from Last 3 Encounters:  03/23/21 238 lb 3.2 oz (108 kg)  12/13/20 243 lb (110.2 kg)  10/25/20 232 lb (105.2 kg)     Health Maintenance Due  Topic Date Due  . FOOT EXAM  Never done  . MAMMOGRAM   Never done    There are no preventive care reminders to display for this patient.  Lab Results  Component Value Date   TSH 2.700 12/13/2020   Lab Results  Component Value Date   WBC 6.7 12/28/2019   HGB 14.9 12/28/2019   HCT 44.2 12/28/2019   MCV 90 12/28/2019   PLT 329 12/28/2019   Lab Results  Component Value Date   NA 139 12/13/2020   K 4.3 12/13/2020   CO2 26 12/13/2020   GLUCOSE 113 (H) 12/13/2020   BUN 13 12/13/2020   CREATININE 0.88 12/13/2020   BILITOT 0.9 06/15/2020   ALKPHOS 78 06/15/2020   AST 21 06/15/2020   ALT 31 06/15/2020   PROT 7.0 06/15/2020   ALBUMIN 4.3 06/15/2020   CALCIUM 9.9 12/13/2020   ANIONGAP 8 04/04/2017   Lab Results  Component Value Date   CHOL 170 12/13/2020   Lab Results  Component Value Date   HDL 44 12/13/2020   Lab Results  Component Value Date   LDLCALC 89 12/13/2020   Lab Results  Component Value Date   TRIG 221 (H) 12/13/2020   No results found for: CHOLHDL Lab Results  Component Value Date   HGBA1C 6.8 12/13/2020      Assessment & Plan:   Problem List Items Addressed This Visit      Cardiovascular and Mediastinum   Hypertension associated with diabetes ([place])    Blood pressure is elevated today. Has been checking her blood pressure at home and it has been 130s/80s. She said that it has gone up since she took the prednisone for her URI. Continue current regimen and monitoring BP daily at home. Refills sent to the pharmacy. Follow-up 1 month.       Relevant Medications   lisinopril (ZESTRIL) 20 MG tablet   metFORMIN (GLUCOPHAGE) 500 MG tablet     Respiratory   Centrilobular emphysema (HCC)    Symptoms well controlled with Breo, however every time she uses it, she gets sores in her mouth. She rinses her mouth out every time as well and this still occurs. Was planning to get spirometry today in office, however didn't have the right supplies. Will change her Breo to Anoro and follow-up next month with spirometry.        Relevant Medications   albuterol (VENTOLIN HFA) 108 (90 Base) MCG/ACT inhaler   umeclidinium-vilanterol (ANORO ELLIPTA) 62.5-25 MCG/INH AEPB     Endocrine   Type 2 diabetes mellitus with morbid obesity ([place]) - Primary    Has had episodes of hypoglycemia in the 60s about once a week. When this happens, she feels dizzy and has a headache. After eating a snack, her sugar goes back up and her symptoms resolve. Will check A1C today. Decrease metformin to 500mg  BID. Continue monitoring blood sugars at home. Call the office if she is still experiencing weekly hypoglycemia episodes. Follow-up next month.      Relevant Medications   lisinopril (ZESTRIL) 20 MG tablet   metFORMIN (GLUCOPHAGE) 500 MG tablet   Other Relevant Orders   HgB A1c     Other   Fibromyalgia    Cymbalta and gabapentin in the past with minimal relief and unable to tolerate cymbalta. Will start nortriptyline for depression and  fibromyalgia. Follow up in 4 weeks.       Relevant Medications   nortriptyline (PAMELOR) 25 MG capsule   Depression, recurrent (HCC)    PHQ-9 11 today. She is tearful in the room. She has tried cymbalta in the past and didn't tolerate it. Will start nortriptyline at night. This can help with her mood and fibromyalgia pain. Currently denies SI/HI. Call our office or go to ER if develop SI/HI. Follow-up in 4 weeks.       Relevant Medications   nortriptyline (PAMELOR) 25 MG capsule      Meds ordered this encounter  Medications  . albuterol (VENTOLIN HFA) 108 (90 Base) MCG/ACT inhaler    Sig: Inhale 2 puffs into the lungs every 6 (six) hours as needed for wheezing or shortness of breath.    Dispense:  18 g    Refill:  1  . lisinopril (ZESTRIL) 20 MG tablet    Sig: Take 1 tablet (20 mg total) by mouth daily.    Dispense:  90 tablet    Refill:  1  . metFORMIN (GLUCOPHAGE) 500 MG tablet    Sig: Take 1 tablet (500 mg total) by mouth 2 (two) times daily with a meal.    Dispense:  180 tablet     Refill:  3  . nortriptyline (PAMELOR) 25 MG capsule    Sig: Take 1 capsule (25 mg total) by mouth at bedtime.    Dispense:  30 capsule    Refill:  1  . umeclidinium-vilanterol (ANORO ELLIPTA) 62.5-25 MCG/INH AEPB    Sig: Inhale 1 puff into the lungs daily.    Dispense:  14 each    Refill:  2    Follow-up: Return in about 4 weeks (around 04/20/2021) for depression, fibromyalgia, dm, copd--needs spirometry.    Charyl Dancer, NP

## 2021-03-23 NOTE — Assessment & Plan Note (Signed)
Has had episodes of hypoglycemia in the 60s about once a week. When this happens, she feels dizzy and has a headache. After eating a snack, her sugar goes back up and her symptoms resolve. Will check A1C today. Decrease metformin to 500mg  BID. Continue monitoring blood sugars at home. Call the office if she is still experiencing weekly hypoglycemia episodes. Follow-up next month.

## 2021-03-23 NOTE — Assessment & Plan Note (Signed)
Cymbalta and gabapentin in the past with minimal relief and unable to tolerate cymbalta. Will start nortriptyline for depression and fibromyalgia. Follow up in 4 weeks.

## 2021-03-23 NOTE — Assessment & Plan Note (Signed)
Symptoms well controlled with Breo, however every time she uses it, she gets sores in her mouth. She rinses her mouth out every time as well and this still occurs. Was planning to get spirometry today in office, however didn't have the right supplies. Will change her Breo to Anoro and follow-up next month with spirometry.

## 2021-03-24 LAB — HEMOGLOBIN A1C
Est. average glucose Bld gHb Est-mCnc: 171 mg/dL
Hgb A1c MFr Bld: 7.6 % — ABNORMAL HIGH (ref 4.8–5.6)

## 2021-04-12 ENCOUNTER — Ambulatory Visit: Payer: BC Managed Care – PPO | Admitting: Nurse Practitioner

## 2021-04-15 ENCOUNTER — Other Ambulatory Visit: Payer: Self-pay | Admitting: Nurse Practitioner

## 2021-04-20 ENCOUNTER — Ambulatory Visit: Payer: BC Managed Care – PPO | Admitting: Nurse Practitioner

## 2021-04-21 ENCOUNTER — Other Ambulatory Visit: Payer: Self-pay | Admitting: Nurse Practitioner

## 2021-05-01 ENCOUNTER — Telehealth: Payer: Self-pay | Admitting: Pharmacist

## 2021-05-01 NOTE — Chronic Care Management (AMB) (Signed)
    Chronic Care Management Pharmacy Assistant   Name: Meredith Butler  MRN: [id number] DOB: [birth year]   Reason for Encounter: Disease State General Adherence    Recent office visits:  03/23/21-[facility], NP. General follow up. Change her Breo to Anoro 62.5-25 mcg. Decrease metformin to 500mg  BID from 1,000 mg twice daily. Start nortriptyline 25 mg for depression and fibromyalgia. Labs ordered.Follow up in 4 weeks. 03/10/21 Meredith Peper, NP. Seen for a headache. Started on Zithromax 250 mg and prednisone 10 mg. Continue tylenol prn pain/fever. Can use saline nasal spray, encourage fluids and rest. Recent consult visits:  None noted  Butler visits:  None in previous 6 months  Medications: Outpatient Encounter Medications as of 05/01/2021  Medication Sig   albuterol (VENTOLIN HFA) 108 (90 Base) MCG/ACT inhaler Inhale 2 puffs into the lungs every 6 (six) hours as needed for wheezing or shortness of breath.   aspirin EC 81 MG tablet Take 1 tablet (81 mg total) by mouth daily. (Patient taking differently: Take 81 mg by mouth every evening.)   cetirizine (ZYRTEC) 10 MG tablet Take 1 tablet (10 mg total) by mouth daily.   EPINEPHRINE 0.3 mg/0.3 mL IJ SOAJ injection INJECT 0.3 MLS (0.3 MG TOTAL) INTO THE MUSCLE AS NEEDED FOR ANAPHYLAXIS.   ezetimibe (ZETIA) 10 MG tablet TAKE 1 TABLET BY MOUTH EVERY DAY   glucose blood test strip Use as instructed   lisinopril (ZESTRIL) 20 MG tablet Take 1 tablet (20 mg total) by mouth daily.   lisinopril-hydrochlorothiazide (ZESTORETIC) 20-25 MG tablet TAKE 1 TABLET BY MOUTH EVERY DAY   Magnesium 250 MG TABS Take 250 mg by mouth daily as needed (leg cramps.).    metFORMIN (GLUCOPHAGE) 500 MG tablet Take 1 tablet (500 mg total) by mouth 2 (two) times daily with a meal.   nitroGLYCERIN (NITROSTAT) 0.4 MG SL tablet Place 1 tablet (0.4 mg total) under the tongue every 5 (five) minutes as needed for chest pain.   nortriptyline (PAMELOR) 25 MG capsule Take 1  capsule (25 mg total) by mouth at bedtime.   umeclidinium-vilanterol (ANORO ELLIPTA) 62.5-25 MCG/INH AEPB Inhale 1 puff into the lungs daily.   No facility-administered encounter medications on file as of 05/01/2021.   Unable to reach patient for General Adherence call.  Star Rating Drugs: Lisinopril 20 mg Last filled:03/23/21 90 DS Metformin 500 mg Last filled:/03/22 90 DS Lisinopril-hydrochlorothiazide 20-25 mg Last filled:03/20/21 90 DS   Meredith Butler, Meredith Butler

## 2021-05-13 ENCOUNTER — Other Ambulatory Visit: Payer: Self-pay | Admitting: Nurse Practitioner

## 2021-05-13 NOTE — Telephone Encounter (Signed)
Needs appointment

## 2021-05-17 ENCOUNTER — Other Ambulatory Visit: Payer: Self-pay | Admitting: Nurse Practitioner

## 2021-05-18 ENCOUNTER — Other Ambulatory Visit: Payer: Self-pay | Admitting: Nurse Practitioner

## 2021-05-18 NOTE — Telephone Encounter (Signed)
Requested medication (s) are due for refill today: yes   Requested medication (s) are on the active medication list: yes   Last refill:  04/20/2021  Future visit scheduled: no  Notes to clinic: One inhaler should last at least one month. If the patient is requesting refills earlier, contact the patient to check for uncontrolled symptoms   Requested Prescriptions  Pending Prescriptions Disp Refills   albuterol (VENTOLIN HFA) 108 (90 Base) MCG/ACT inhaler [Pharmacy Med Name: ALBUTEROL HFA (PROAIR) INHALER] 8.5 each 1    Sig: TAKE 2 PUFFS BY MOUTH EVERY 6 HOURS AS NEEDED FOR WHEEZE OR SHORTNESS OF BREATH      Pulmonology:  Beta Agonists Failed - 05/18/2021  2:50 AM      Failed - One inhaler should last at least one month. If the patient is requesting refills earlier, contact the patient to check for uncontrolled symptoms.      Passed - Valid encounter within last 12 months    Recent Outpatient Visits           1 month ago Type 2 diabetes mellitus with morbid obesity ([place])   [place], Lauren A, NP   2 months ago Acute non-recurrent maxillary sinusitis   [facility] [place], Lauren A, NP   5 months ago Type 2 diabetes mellitus with morbid obesity ([place])   [place], [place] T, NP   6 months ago Suspected COVID-19 virus infection   [employer], [place], DO   10 months ago Essential hypertension, benign   [place], [place], [place]

## 2021-05-18 NOTE — Telephone Encounter (Signed)
Pt Return in about 4 weeks (around 04/20/2021) for depression, fibromyalgia, dm, copd--needs spirometry.

## 2021-05-24 ENCOUNTER — Other Ambulatory Visit: Payer: Self-pay | Admitting: Nurse Practitioner

## 2021-05-30 ENCOUNTER — Encounter: Payer: Self-pay | Admitting: Nurse Practitioner

## 2021-05-30 ENCOUNTER — Other Ambulatory Visit: Payer: Self-pay

## 2021-05-30 ENCOUNTER — Ambulatory Visit
Admission: RE | Admit: 2021-05-30 | Discharge: 2021-05-30 | Disposition: A | Discharge status: Home / Self Care | Payer: BC Managed Care – PPO | Source: Ambulatory Visit | Attending: Nurse Practitioner | Admitting: Nurse Practitioner

## 2021-05-30 ENCOUNTER — Ambulatory Visit
Admission: RE | Admit: 2021-05-30 | Discharge: 2021-05-30 | Disposition: A | Discharge status: Home / Self Care | Payer: Medicare Other | Attending: Nurse Practitioner | Admitting: Nurse Practitioner

## 2021-05-30 ENCOUNTER — Ambulatory Visit (INDEPENDENT_AMBULATORY_CARE_PROVIDER_SITE_OTHER): Payer: BC Managed Care – PPO | Admitting: Nurse Practitioner

## 2021-05-30 VITALS — BP 135/99 | HR 93 | Temp 97.9°F | Wt 236.6 lb

## 2021-05-30 DIAGNOSIS — M25531 Pain in right wrist: Secondary | ICD-10-CM | POA: Insufficient documentation

## 2021-05-30 DIAGNOSIS — E1169 Type 2 diabetes mellitus with other specified complication: Secondary | ICD-10-CM

## 2021-05-30 DIAGNOSIS — E1159 Type 2 diabetes mellitus with other circulatory complications: Secondary | ICD-10-CM | POA: Diagnosis not present

## 2021-05-30 DIAGNOSIS — F339 Major depressive disorder, recurrent, unspecified: Secondary | ICD-10-CM | POA: Diagnosis not present

## 2021-05-30 DIAGNOSIS — I209 Angina pectoris, unspecified: Secondary | ICD-10-CM

## 2021-05-30 DIAGNOSIS — E785 Hyperlipidemia, unspecified: Secondary | ICD-10-CM

## 2021-05-30 DIAGNOSIS — I700 Atherosclerosis of aorta: Secondary | ICD-10-CM

## 2021-05-30 DIAGNOSIS — M797 Fibromyalgia: Secondary | ICD-10-CM

## 2021-05-30 DIAGNOSIS — I152 Hypertension secondary to endocrine disorders: Secondary | ICD-10-CM

## 2021-05-30 MED ORDER — GABAPENTIN 100 MG PO CAPS: 100.0000 mg | ORAL_CAPSULE | Freq: Three times a day (TID) | ORAL | 0 refills | Status: DC

## 2021-05-30 MED ORDER — AMLODIPINE BESYLATE 5 MG PO TABS: 5.0000 mg | ORAL_TABLET | Freq: Every day | ORAL | 0 refills | Status: DC

## 2021-05-30 NOTE — Assessment & Plan Note (Signed)
Controlled. Does not want to be on nortriptyline. Feels like her depression is situation and will improve as she grieves.  Follow up in 1 month for reevaluation.

## 2021-05-30 NOTE — Assessment & Plan Note (Signed)
Chronic.  Controlled.  Continue with current medication regimen.  Labs ordered today.  Return to clinic in 6 months for reevaluation.  Call sooner if concerns arise.  ? ?

## 2021-05-30 NOTE — Assessment & Plan Note (Signed)
Chronic.  Did not like Fibromyalgia.  Will begin Gabapentin daily.  Side effects and benefits discussed during visit today.  Follow up in 1 month for reevaluation.  Call sooner if concerns arise.

## 2021-05-30 NOTE — Telephone Encounter (Signed)
Pt has apt on 05/30/2021

## 2021-05-30 NOTE — Progress Notes (Signed)
BP (!) 135/99   Pulse 93   Temp 97.9 F (36.6 C)   Wt 236 lb 9.6 oz (107.3 kg)   SpO2 96%   BMI 36.07 kg/m    Subjective:    Patient ID: Meredith Butler, female    DOB: 12-Oct-1956, 65 y.o.   MRN: [id number]  HPI: Meredith Butler is a 65 y.o. female  Chief Complaint  Patient presents with   Diabetes   Hypertension   Depression   Fibromyalgia   Wrist Pain    Patient states about 2 weeks ago she noticed a knot on her right wrist that is painful    WRIST PAIN Patient states that about two weeks she noticed a knot on her wrist.  States it is painful to move it sometimes.  She has been using ice which helps with pain.  Patient denies pain with touching.    HYPERTENSION / HYPERLIPIDEMIA Satisfied with current treatment? yes Duration of hypertension: years BP monitoring frequency: daily BP range: 130/90 BP medication side effects: no Past BP meds: lisinopril-HCTZ Duration of hyperlipidemia: years Cholesterol medication side effects: no Cholesterol supplements: none Past cholesterol medications: ezetimide (zetia) Medication compliance: excellent compliance Aspirin: yes Recent stressors: no Recurrent headaches: no Visual changes: no Palpitations: no Dyspnea: yes Chest pain: no Lower extremity edema: no Dizzy/lightheaded: no  DIABETES Hypoglycemic episodes:no Polydipsia/polyuria: yes- very thirsty at night Visual disturbance: no Chest pain: yes- occasional- uses nitro Paresthesias: no Glucose Monitoring: yes  Accucheck frequency: Daily  Fasting glucose: 140s  Post prandial:  Evening:  Before meals: Taking Insulin?: no  Long acting insulin:  Short acting insulin: Blood Pressure Monitoring: daily Retinal Examination: Up to Date Foot Exam: Not up to Date Diabetic Education: Not Completed Pneumovax: Up to Date Influenza: Not up to Date Aspirin: yes  DEPRESSION/FIBROMYALGIA Patient tried taking the nortriptyline but didn't care for the way it made her feel.  States  some days she was very tired and she wasn't able to get off the couch.  Did not feel like it helped with her pain.  She would like to try gabapentin to see if it will help with her pain.  Patient feels like her depression is situational due to having 4 deaths in her family recently.  Denies SI and feels like she is managing it well onher own.  Has good day and bad days.    Relevant past medical, surgical, family and social history reviewed and updated as indicated. Interim medical history since our last visit reviewed. Allergies and medications reviewed and updated.  Review of Systems  Eyes:  Negative for visual disturbance.  Respiratory:  Positive for shortness of breath. Negative for cough and chest tightness.   Cardiovascular:  Positive for chest pain. Negative for palpitations and leg swelling.  Endocrine: Positive for polydipsia. Negative for polyuria.  Musculoskeletal:        Right wrist pain  Neurological:  Negative for dizziness and headaches.  Psychiatric/Behavioral:  Positive for dysphoric mood. Negative for suicidal ideas.    Per HPI unless specifically indicated above     Objective:    BP (!) 135/99   Pulse 93   Temp 97.9 F (36.6 C)   Wt 236 lb 9.6 oz (107.3 kg)   SpO2 96%   BMI 36.07 kg/m   Wt Readings from Last 3 Encounters:  05/30/21 236 lb 9.6 oz (107.3 kg)  03/23/21 238 lb 3.2 oz (108 kg)  12/13/20 243 lb (110.2 kg)    Physical Exam Vitals and  nursing note reviewed.  Constitutional:      General: She is not in acute distress.    Appearance: Normal appearance. She is normal weight. She is not ill-appearing, toxic-appearing or diaphoretic.  HENT:     Head: Normocephalic.     Right Ear: External ear normal.     Left Ear: External ear normal.     Nose: Nose normal.     Mouth/Throat:     Mouth: Mucous membranes are moist.     Pharynx: Oropharynx is clear.  Eyes:     General:        Right eye: No discharge.        Left eye: No discharge.     Extraocular  Movements: Extraocular movements intact.     Conjunctiva/sclera: Conjunctivae normal.     Pupils: Pupils are equal, round, and reactive to light.  Cardiovascular:     Rate and Rhythm: Normal rate and regular rhythm.     Heart sounds: No murmur heard. Pulmonary:     Effort: Pulmonary effort is normal. No respiratory distress.     Breath sounds: Normal breath sounds. No wheezing or rales.  Musculoskeletal:        General: Swelling (slight swelling on medial aspect of wrist) present. Normal range of motion.     Cervical back: Normal range of motion and neck supple.  Skin:    General: Skin is warm and dry.     Capillary Refill: Capillary refill takes less than 2 seconds.  Neurological:     General: No focal deficit present.     Mental Status: She is alert and oriented to person, place, and time. Mental status is at baseline.  Psychiatric:        Mood and Affect: Mood normal.        Behavior: Behavior normal.        Thought Content: Thought content normal.        Judgment: Judgment normal.    Results for orders placed or performed in visit on 03/23/21  HgB A1c  Result Value Ref Range   Hgb A1c MFr Bld 7.6 (H) 4.8 - 5.6 %   Est. average glucose Bld gHb Est-mCnc 171 mg/dL      Assessment & Plan:   Problem List Items Addressed This Visit       Cardiovascular and Mediastinum   Aortic atherosclerosis (HCC)    Chronic.  Controlled.  Continue with current medication regimen.  Labs ordered today.  Return to clinic in 6 months for reevaluation.  Call sooner if concerns arise.         Relevant Medications   amLODipine (NORVASC) 5 MG tablet   Other Relevant Orders   Comp Met (CMET)   HgB A1c   Lipid Profile   Hypertension associated with diabetes ([place]) - Primary    Chronic.  Elevated at visit and at home.  Will add Amlodipine to regimen.  Discussed side effects and benefits of medication with patient during visit. Follow up in 1 month for reevaluation.  Call sooner if concerns  arise.        Relevant Medications   amLODipine (NORVASC) 5 MG tablet   Other Relevant Orders   Comp Met (CMET)   HgB A1c   Lipid Profile     Endocrine   Type 2 diabetes mellitus with morbid obesity (HCC)    Chronic.  Controlled.  Continue with current medication regimen.  Labs ordered today.  Return to clinic in 6 months for reevaluation.  Call sooner if concerns arise.         Relevant Orders   Comp Met (CMET)   HgB A1c   Lipid Profile   Hyperlipidemia associated with type 2 diabetes mellitus (HCC)    Chronic.  Controlled.  Continue with current medication regimen.  Labs ordered today.  Return to clinic in 6 months for reevaluation.  Call sooner if concerns arise.         Relevant Orders   Comp Met (CMET)   HgB A1c   Lipid Profile     Other   Fibromyalgia    Chronic.  Did not like Fibromyalgia.  Will begin Gabapentin daily.  Side effects and benefits discussed during visit today.  Follow up in 1 month for reevaluation.  Call sooner if concerns arise.        Relevant Medications   gabapentin (NEURONTIN) 100 MG capsule   Depression, recurrent ([place])    Controlled. Does not want to be on nortriptyline. Feels like her depression is situation and will improve as she grieves.  Follow up in 1 month for reevaluation.        Other Visit Diagnoses     Right wrist pain       Relevant Orders   DG Wrist Complete Right        Follow up plan: Return in about 1 month (around 06/30/2021) for BP Check and fibromyalgia.

## 2021-05-30 NOTE — Assessment & Plan Note (Signed)
Chronic.  Elevated at visit and at home.  Will add Amlodipine to regimen.  Discussed side effects and benefits of medication with patient during visit. Follow up in 1 month for reevaluation.  Call sooner if concerns arise.

## 2021-05-31 LAB — COMPREHENSIVE METABOLIC PANEL
ALT: 33 IU/L — ABNORMAL HIGH (ref 0–32)
AST: 26 IU/L (ref 0–40)
Albumin/Globulin Ratio: 1.9 (ref 1.2–2.2)
Albumin: 4.6 g/dL (ref 3.8–4.8)
Alkaline Phosphatase: 68 IU/L (ref 44–121)
BUN/Creatinine Ratio: 20 (ref 12–28)
BUN: 15 mg/dL (ref 8–27)
Bilirubin Total: 0.5 mg/dL (ref 0.0–1.2)
CO2: 23 mmol/L (ref 20–29)
Calcium: 10 mg/dL (ref 8.7–10.3)
Chloride: 98 mmol/L (ref 96–106)
Creatinine, Ser: 0.75 mg/dL (ref 0.57–1.00)
Globulin, Total: 2.4 g/dL (ref 1.5–4.5)
Glucose: 144 mg/dL — ABNORMAL HIGH (ref 65–99)
Potassium: 4.4 mmol/L (ref 3.5–5.2)
Sodium: 138 mmol/L (ref 134–144)
Total Protein: 7 g/dL (ref 6.0–8.5)
eGFR: 88 mL/min/{1.73_m2} (ref >59)

## 2021-05-31 LAB — LIPID PANEL
Chol/HDL Ratio: 4.2 ratio (ref 0.0–4.4)
Cholesterol, Total: 156 mg/dL (ref 100–199)
HDL: 37 mg/dL — ABNORMAL LOW (ref >39)
LDL Chol Calc (NIH): 71 mg/dL (ref 0–99)
Triglycerides: 301 mg/dL — ABNORMAL HIGH (ref 0–149)
VLDL Cholesterol Cal: 48 mg/dL — ABNORMAL HIGH (ref 5–40)

## 2021-05-31 LAB — HEMOGLOBIN A1C
Est. average glucose Bld gHb Est-mCnc: 171 mg/dL
Hgb A1c MFr Bld: 7.6 % — ABNORMAL HIGH (ref 4.8–5.6)

## 2021-05-31 NOTE — Progress Notes (Signed)
Please let patient know that her lab work looks good. A1c remains stable at 7.6.  Triglycerides were elevated from prior but likely because she wasn't fasting.  We will continue to monitor them at future visits.

## 2021-06-02 ENCOUNTER — Telehealth: Payer: Self-pay

## 2021-06-02 NOTE — Telephone Encounter (Signed)
Returned patient call and gave results. No further questions.

## 2021-06-02 NOTE — Telephone Encounter (Signed)
Copied from [place] [id number]. Topic: General - Other >> Jun 02, 2021  8:44 AM Valere Dross wrote: Reason for CRM: Pt called in wanting to get her lab results, I didn't see where nurse triage could give them so I called office, and was instructed to send CRM, pt requested a call back. Please advise.

## 2021-06-02 NOTE — Progress Notes (Signed)
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  I recommend she see Orthopedics for further evaluation and management.  I have placed the referral.

## 2021-06-02 NOTE — Addendum Note (Signed)
Addended by: [name] on: 06/02/2021 08:00 AM   Modules accepted: Orders

## 2021-06-22 ENCOUNTER — Other Ambulatory Visit: Payer: Self-pay | Admitting: Family Medicine

## 2021-06-22 ENCOUNTER — Other Ambulatory Visit: Payer: Self-pay | Admitting: Nurse Practitioner

## 2021-06-22 NOTE — Telephone Encounter (Signed)
Patient last seen 05/30/21

## 2021-06-22 NOTE — Telephone Encounter (Signed)
Requested medications are due for refill today.  yes  Requested medications are on the active medications list.  yes  Last refill. 06/15/2020  Future visit scheduled.   yes  Notes to clinic.  Prescription is expired.

## 2021-06-23 ENCOUNTER — Other Ambulatory Visit: Payer: Self-pay | Admitting: Nurse Practitioner

## 2021-06-23 ENCOUNTER — Other Ambulatory Visit: Payer: Self-pay | Admitting: Family Medicine

## 2021-06-26 ENCOUNTER — Other Ambulatory Visit: Payer: Self-pay | Admitting: Nurse Practitioner

## 2021-07-11 NOTE — Progress Notes (Signed)
BP 130/85   Pulse 84   Temp 98.3 F (36.8 C)   Ht 5' 8.74" (1.746 m)   Wt 233 lb 8 oz (105.9 kg)   SpO2 96%   BMI 34.74 kg/m    Subjective:    Patient ID: Meredith Butler, female    DOB: February 15, 1956, 65 y.o.   MRN: [id number]  HPI: Meredith Butler is a 65 y.o. female  Chief Complaint  Patient presents with   Hypertension   Fibromyalgia   Ear Fullness   HYPERTENSION Hypertension status: controlled  Satisfied with current treatment? yes Duration of hypertension: years BP monitoring frequency:  daily BP range: 130/86 BP medication side effects:  no Medication compliance: excellent compliance Previous BP meds:amlodipine, lisinopril, and lisinopril-HCTZ Aspirin: yes Recurrent headaches: yes Visual changes: no Palpitations: no Dyspnea: yes Chest pain: yes Lower extremity edema: no Dizzy/lightheaded: no  FIBROMYALGIA Patient feels like the gabapentin is helping.  Feels like this is a good dose for her.  Does not want to increase the dose.    ALLERGIES Patient is having ear fullness, congestion, and headaches.  Patient feels like she is having a sinus infection.   Relevant past medical, surgical, family and social history reviewed and updated as indicated. Interim medical history since our last visit reviewed. Allergies and medications reviewed and updated.  Review of Systems  HENT:  Positive for congestion and rhinorrhea.        Ear fullness  Eyes:  Negative for visual disturbance.  Respiratory:  Positive for shortness of breath. Negative for cough and chest tightness.   Cardiovascular:  Positive for chest pain. Negative for palpitations and leg swelling.  Neurological:  Positive for headaches. Negative for dizziness.   Per HPI unless specifically indicated above     Objective:    BP 130/85   Pulse 84   Temp 98.3 F (36.8 C)   Ht 5' 8.74" (1.746 m)   Wt 233 lb 8 oz (105.9 kg)   SpO2 96%   BMI 34.74 kg/m   Wt Readings from Last 3 Encounters:  07/12/21 233 lb 8 oz  (105.9 kg)  05/30/21 236 lb 9.6 oz (107.3 kg)  03/23/21 238 lb 3.2 oz (108 kg)    Physical Exam Vitals and nursing note reviewed.  Constitutional:      General: She is not in acute distress.    Appearance: Normal appearance. She is normal weight. She is not ill-appearing, toxic-appearing or diaphoretic.  HENT:     Head: Normocephalic.     Right Ear: Tympanic membrane, ear canal and external ear normal.     Left Ear: Tympanic membrane, ear canal and external ear normal.     Nose: Congestion present.     Right Sinus: Maxillary sinus tenderness present. No frontal sinus tenderness.     Left Sinus: Maxillary sinus tenderness present. No frontal sinus tenderness.     Mouth/Throat:     Mouth: Mucous membranes are moist.     Pharynx: Oropharynx is clear.  Eyes:     General:        Right eye: No discharge.        Left eye: No discharge.     Extraocular Movements: Extraocular movements intact.     Conjunctiva/sclera: Conjunctivae normal.     Pupils: Pupils are equal, round, and reactive to light.  Cardiovascular:     Rate and Rhythm: Normal rate and regular rhythm.     Heart sounds: No murmur heard. Pulmonary:     Effort:  Pulmonary effort is normal. No respiratory distress.     Breath sounds: Normal breath sounds. No wheezing or rales.  Musculoskeletal:     Cervical back: Normal range of motion and neck supple.  Skin:    General: Skin is warm and dry.     Capillary Refill: Capillary refill takes less than 2 seconds.  Neurological:     General: No focal deficit present.     Mental Status: She is alert and oriented to person, place, and time. Mental status is at baseline.  Psychiatric:        Mood and Affect: Mood normal.        Behavior: Behavior normal.        Thought Content: Thought content normal.        Judgment: Judgment normal.    Results for orders placed or performed in visit on 05/30/21  Comp Met (CMET)  Result Value Ref Range   Glucose 144 (H) 65 - 99 mg/dL   BUN  15 8 - 27 mg/dL   Creatinine, Ser 0.75 0.57 - 1.00 mg/dL   eGFR 88 >59 mL/min/1.73   BUN/Creatinine Ratio 20 12 - 28   Sodium 138 134 - 144 mmol/L   Potassium 4.4 3.5 - 5.2 mmol/L   Chloride 98 96 - 106 mmol/L   CO2 23 20 - 29 mmol/L   Calcium 10.0 8.7 - 10.3 mg/dL   Total Protein 7.0 6.0 - 8.5 g/dL   Albumin 4.6 3.8 - 4.8 g/dL   Globulin, Total 2.4 1.5 - 4.5 g/dL   Albumin/Globulin Ratio 1.9 1.2 - 2.2   Bilirubin Total 0.5 0.0 - 1.2 mg/dL   Alkaline Phosphatase 68 44 - 121 IU/L   AST 26 0 - 40 IU/L   ALT 33 (H) 0 - 32 IU/L  HgB A1c  Result Value Ref Range   Hgb A1c MFr Bld 7.6 (H) 4.8 - 5.6 %   Est. average glucose Bld gHb Est-mCnc 171 mg/dL  Lipid Profile  Result Value Ref Range   Cholesterol, Total 156 100 - 199 mg/dL   Triglycerides 301 (H) 0 - 149 mg/dL   HDL 37 (L) >39 mg/dL   VLDL Cholesterol Cal 48 (H) 5 - 40 mg/dL   LDL Chol Calc (NIH) 71 0 - 99 mg/dL   Chol/HDL Ratio 4.2 0.0 - 4.4 ratio      Assessment & Plan:   Problem List Items Addressed This Visit       Cardiovascular and Mediastinum   Hypertension associated with diabetes ([place]) - Primary    Chronic.  Controlled.  Patient's blood pressure has improved with Amlodipine 65m.  Will continue with current dose.  Continue to check blood pressures at home. Will continue to monitor at future visits.       Relevant Medications   aspirin 325 MG tablet     Respiratory   Acute non-recurrent maxillary sinusitis    Zpak given to patient today. Patient also started on Singulair to help with seasonal allergies and help with breathing.  Follow up if symptoms do not improve.       Relevant Medications   azithromycin (ZITHROMAX) 250 MG tablet     Other   Fibromyalgia    Chronic.  Controlled.  Continue with current medication regimen.  Will reassess at next visit and adjust gabapentin as needed.  Return to clinic in 3 months for reevaluation.  Call sooner if concerns arise.        Relevant Medications  aspirin 325  MG tablet     Follow up plan: Return in about 3 months (around 10/12/2021) for Physical and Fasting labs.    A total of 30 minutes were spent on this encounter today.  When total time is documented, this includes both the face-to-face and non-face-to-face time personally spent before, during and after the visit on the date of the encounter.

## 2021-07-12 ENCOUNTER — Ambulatory Visit (INDEPENDENT_AMBULATORY_CARE_PROVIDER_SITE_OTHER): Payer: BC Managed Care – PPO | Admitting: Nurse Practitioner

## 2021-07-12 ENCOUNTER — Other Ambulatory Visit: Payer: Self-pay

## 2021-07-12 ENCOUNTER — Encounter: Payer: Self-pay | Admitting: Nurse Practitioner

## 2021-07-12 VITALS — BP 130/85 | HR 84 | Temp 98.3°F | Ht 68.74 in | Wt 233.5 lb

## 2021-07-12 DIAGNOSIS — E1159 Type 2 diabetes mellitus with other circulatory complications: Secondary | ICD-10-CM

## 2021-07-12 DIAGNOSIS — I209 Angina pectoris, unspecified: Secondary | ICD-10-CM

## 2021-07-12 DIAGNOSIS — J0100 Acute maxillary sinusitis, unspecified: Secondary | ICD-10-CM | POA: Diagnosis not present

## 2021-07-12 DIAGNOSIS — M797 Fibromyalgia: Secondary | ICD-10-CM

## 2021-07-12 DIAGNOSIS — I152 Hypertension secondary to endocrine disorders: Secondary | ICD-10-CM

## 2021-07-12 MED ORDER — MONTELUKAST SODIUM 10 MG PO TABS: 10.0000 mg | ORAL_TABLET | Freq: Every day | ORAL | 1 refills | Status: DC

## 2021-07-12 MED ORDER — AZITHROMYCIN 250 MG PO TABS: ORAL_TABLET | ORAL | 0 refills | Status: AC

## 2021-07-12 NOTE — Assessment & Plan Note (Signed)
Chronic.  Controlled.  Patient's blood pressure has improved with Amlodipine '5mg'$ .  Will continue with current dose.  Continue to check blood pressures at home. Will continue to monitor at future visits.

## 2021-07-12 NOTE — Assessment & Plan Note (Signed)
Chronic.  Controlled.  Continue with current medication regimen.  Will reassess at next visit and adjust gabapentin as needed.  Return to clinic in 3 months for reevaluation.  Call sooner if concerns arise.

## 2021-07-12 NOTE — Assessment & Plan Note (Signed)
Zpak given to patient today. Patient also started on Singulair to help with seasonal allergies and help with breathing.  Follow up if symptoms do not improve.

## 2021-07-15 ENCOUNTER — Other Ambulatory Visit: Payer: Self-pay | Admitting: Nurse Practitioner

## 2021-07-15 NOTE — Telephone Encounter (Signed)
Requested Prescriptions  Pending Prescriptions Disp Refills  . amLODipine (NORVASC) 5 MG tablet [Pharmacy Med Name: AMLODIPINE BESYLATE 5 MG TAB] 90 tablet 0    Sig: TAKE 1 TABLET (5 MG TOTAL) BY MOUTH DAILY.     Cardiovascular:  Calcium Channel Blockers Passed - 07/15/2021  9:32 AM      Passed - Last BP in normal range    BP Readings from Last 1 Encounters:  07/12/21 130/85         Passed - Valid encounter within last 6 months    Recent Outpatient Visits          3 days ago Hypertension associated with diabetes [facility])   [facility] [name], NP   1 month ago Hypertension associated with diabetes ([place])   [facility] [name], NP   3 months ago Type 2 diabetes mellitus with morbid obesity ([place])   [place], Lauren A, NP   4 months ago Acute non-recurrent maxillary sinusitis   [facility] [place], Lauren A, NP   7 months ago Type 2 diabetes mellitus with morbid obesity ([place])   [place], Jolene T, NP      Future Appointments            In 3 months [name], NP [facility], [place]

## 2021-07-15 NOTE — Telephone Encounter (Signed)
Requested Prescriptions  Pending Prescriptions Disp Refills  . ANORO ELLIPTA 62.5-25 MCG/INH AEPB [Pharmacy Med Name: ANORO ELLIPTA 62.5-25 MCG INH] 41 each 2    Sig: TAKE 1 PUFF BY MOUTH EVERY DAY     Pulmonology:  Combination Products Passed - 07/15/2021  9:27 AM      Passed - Valid encounter within last 12 months    Recent Outpatient Visits          3 days ago Hypertension associated with diabetes ([place])   [facility] [name], NP   1 month ago Hypertension associated with diabetes ([place])   [facility] [name], NP   3 months ago Type 2 diabetes mellitus with morbid obesity ([place])   [place], Lauren A, NP   4 months ago Acute non-recurrent maxillary sinusitis   [facility] [place], Lauren A, NP   7 months ago Type 2 diabetes mellitus with morbid obesity ([place])   [place], Jolene T, NP      Future Appointments            In 3 months [name], NP [facility], [place]

## 2021-07-16 ENCOUNTER — Other Ambulatory Visit: Payer: Self-pay | Admitting: Nurse Practitioner

## 2021-07-16 DIAGNOSIS — E119 Type 2 diabetes mellitus without complications: Secondary | ICD-10-CM

## 2021-07-16 NOTE — Telephone Encounter (Signed)
Called [facility] and advised pharmacist that we are getting the same request 2 times for a med that was dc'd in May. Pharmacist stated he will take care of it.

## 2021-07-26 ENCOUNTER — Other Ambulatory Visit: Payer: Self-pay | Admitting: Nurse Practitioner

## 2021-07-26 NOTE — Telephone Encounter (Signed)
Requested Prescriptions  Pending Prescriptions Disp Refills  . gabapentin (NEURONTIN) 100 MG capsule [Pharmacy Med Name: GABAPENTIN 100 MG CAPSULE] 90 capsule 0    Sig: TAKE 1 CAPSULE (100 MG TOTAL) BY MOUTH THREE TIMES DAILY.     Neurology: Anticonvulsants - gabapentin Passed - 07/26/2021  1:04 PM      Passed - Valid encounter within last 12 months    Recent Outpatient Visits          2 weeks ago Hypertension associated with diabetes [facility])   [facility] [name], NP   1 month ago Hypertension associated with diabetes ([place])   [facility] [name], NP   4 months ago Type 2 diabetes mellitus with morbid obesity ([place])   [place], Lauren A, NP   4 months ago Acute non-recurrent maxillary sinusitis   [facility] [place], Lauren A, NP   7 months ago Type 2 diabetes mellitus with morbid obesity ([place])   [place], Jolene T, NP      Future Appointments            In 2 months [name], NP [facility], [place]

## 2021-08-01 ENCOUNTER — Other Ambulatory Visit: Payer: Self-pay | Admitting: Nurse Practitioner

## 2021-08-01 NOTE — Telephone Encounter (Signed)
Call to pharmacy- verfied patient does not need refills today

## 2021-08-01 NOTE — Telephone Encounter (Signed)
Requested medication (s) are due for refill today - no  Requested medication (s) are on the active medication list -no  Future visit scheduled -yes  Last refill: 04/20/21  Notes to clinic: Request RF: medication has been discontinued by office- sent for review of request  Requested Prescriptions  Pending Prescriptions Disp Refills   nortriptyline (PAMELOR) 25 MG capsule [Pharmacy Med Name: NORTRIPTYLINE HCL 25 MG CAP] 30 capsule 1    Sig: TAKE 1 CAPSULE BY MOUTH AT BEDTIME.     Psychiatry:  Antidepressants - Heterocyclics (TCAs) Passed - 08/01/2021 11:14 AM      Passed - Completed PHQ-2 or PHQ-9 in the last 360 days      Passed - Valid encounter within last 6 months    Recent Outpatient Visits           2 weeks ago Hypertension associated with diabetes ([place])   [facility] [name], NP   2 months ago Hypertension associated with diabetes ([place])   [facility] [name], NP   4 months ago Type 2 diabetes mellitus with morbid obesity ([place])   [place], Lauren A, NP   4 months ago Acute non-recurrent maxillary sinusitis   [facility] McElwee, Lauren A, NP   7 months ago Type 2 diabetes mellitus with morbid obesity ([place])   [place], Henrine Screws T, NP       Future Appointments             In 2 months [name], NP [place]            Refused Prescriptions Disp Refills   lisinopril (ZESTRIL) 20 MG tablet [Pharmacy Med Name: LISINOPRIL 20 MG TABLET] 90 tablet 1    Sig: TAKE 1 TABLET BY MOUTH EVERY DAY     Cardiovascular:  ACE Inhibitors Passed - 08/01/2021 11:14 AM      Passed - Cr in normal range and within 180 days    Creatinine, Ser  Date Value Ref Range Status  05/30/2021 0.75 0.57 - 1.00 mg/dL Final          Passed - K in normal range and within 180 days    Potassium  Date Value Ref Range Status  05/30/2021 4.4 3.5 - 5.2 mmol/L Final           Passed - Patient is not pregnant      Passed - Last BP in normal range    BP Readings from Last 1 Encounters:  07/12/21 130/85          Passed - Valid encounter within last 6 months    Recent Outpatient Visits           2 weeks ago Hypertension associated with diabetes ([place])   [facility] [name], NP   2 months ago Hypertension associated with diabetes ([place])   [facility] [name], NP   4 months ago Type 2 diabetes mellitus with morbid obesity ([place])   [place], Lauren A, NP   4 months ago Acute non-recurrent maxillary sinusitis   [facility] McElwee, Lauren A, NP   7 months ago Type 2 diabetes mellitus with morbid obesity ([place])   [place], Jolene T, NP       Future Appointments             In 2 months [name], NP [facility], [place]  Requested Prescriptions  Pending Prescriptions Disp Refills   nortriptyline (PAMELOR) 25 MG capsule [Pharmacy Med Name: NORTRIPTYLINE HCL 25 MG CAP] 30 capsule 1    Sig: TAKE 1 CAPSULE BY MOUTH AT BEDTIME.     Psychiatry:  Antidepressants - Heterocyclics (TCAs) Passed - 08/01/2021 11:14 AM      Passed - Completed PHQ-2 or PHQ-9 in the last 360 days      Passed - Valid encounter within last 6 months    Recent Outpatient Visits           2 weeks ago Hypertension associated with diabetes ([place])   [facility] [name], NP   2 months ago Hypertension associated with diabetes ([place])   [facility] [name], NP   4 months ago Type 2 diabetes mellitus with morbid obesity ([place])   [place], Lauren A, NP   4 months ago Acute non-recurrent maxillary sinusitis   [facility] McElwee, Lauren A, NP   7 months ago Type 2 diabetes mellitus with morbid obesity ([place])   [place], Henrine Screws T, NP        Future Appointments             In 2 months [name], NP [place], [facility]            Refused Prescriptions Disp Refills   lisinopril (ZESTRIL) 20 MG tablet [Pharmacy Med Name: LISINOPRIL 20 MG TABLET] 90 tablet 1    Sig: TAKE 1 TABLET BY MOUTH EVERY DAY     Cardiovascular:  ACE Inhibitors Passed - 08/01/2021 11:14 AM      Passed - Cr in normal range and within 180 days    Creatinine, Ser  Date Value Ref Range Status  05/30/2021 0.75 0.57 - 1.00 mg/dL Final          Passed - K in normal range and within 180 days    Potassium  Date Value Ref Range Status  05/30/2021 4.4 3.5 - 5.2 mmol/L Final          Passed - Patient is not pregnant      Passed - Last BP in normal range    BP Readings from Last 1 Encounters:  07/12/21 130/85          Passed - Valid encounter within last 6 months    Recent Outpatient Visits           2 weeks ago Hypertension associated with diabetes ([place])   [facility] [name], NP   2 months ago Hypertension associated with diabetes ([place])   [facility] [name], NP   4 months ago Type 2 diabetes mellitus with morbid obesity ([place])   [place], Lauren A, NP   4 months ago Acute non-recurrent maxillary sinusitis   [facility] McElwee, Lauren A, NP   7 months ago Type 2 diabetes mellitus with morbid obesity ([place])   [place], Jolene T, NP       Future Appointments             In 2 months [name], NP [facility], [place]

## 2021-08-01 NOTE — Telephone Encounter (Signed)
Call to pharmacy patient does not need refills today- verified.

## 2021-08-09 ENCOUNTER — Telehealth: Payer: Self-pay

## 2021-08-09 NOTE — Chronic Care Management (AMB) (Signed)
Chronic Care Management Pharmacy Assistant   Name: Meredith Butler  MRN: [id number] DOB: 04/15/1956  Meredith Butler is an 65 y.o. year old female who presents for his follow-up CCM visit with the clinical pharmacist.  Reason for Encounter: Disease State  Recent office visits:  07/12/21 [name] NP PCP- pt was seen for HTN associated with DM. Provider started pt on Montelukast 10 mg daily at bedtime and a Taper of Azithromycin 250 mg. Follow up in 3 months.  05/30/21 [name] NP PCP- pt was seen for HTN associated with DM. Labs were ordered and a referral to Ortho was placed. Provider started patient on Amlodipine Besylate 5 mg daily and Gabapentin 100 mg TID. Follow up in 1 month or sooner if needed.  Recent consult visits:  [facility] visits:  None in previous 6 months  Medications: Outpatient Encounter Medications as of 08/09/2021  Medication Sig   albuterol (VENTOLIN HFA) 108 (90 Base) MCG/ACT inhaler TAKE 2 PUFFS BY MOUTH EVERY 6 HOURS AS NEEDED FOR WHEEZE OR SHORTNESS OF BREATH   amLODipine (NORVASC) 5 MG tablet TAKE 1 TABLET (5 MG TOTAL) BY MOUTH DAILY.   ANORO ELLIPTA 62.5-25 MCG/INH AEPB TAKE 1 PUFF BY MOUTH EVERY DAY   aspirin 325 MG tablet Take 325 mg by mouth daily.   EPINEPHRINE 0.3 mg/0.3 mL IJ SOAJ injection INJECT 0.3 MLS (0.3 MG TOTAL) INTO THE MUSCLE AS NEEDED FOR ANAPHYLAXIS.   ezetimibe (ZETIA) 10 MG tablet TAKE 1 TABLET BY MOUTH EVERY DAY   gabapentin (NEURONTIN) 100 MG capsule TAKE 1 CAPSULE (100 MG TOTAL) BY MOUTH THREE TIMES DAILY.   glucose blood test strip Use as instructed   hydroxypropyl methylcellulose / hypromellose (ISOPTO TEARS / GONIOVISC) 2.5 % ophthalmic solution 1 drop.   lisinopril (ZESTRIL) 20 MG tablet Take 1 tablet (20 mg total) by mouth daily.   lisinopril-hydrochlorothiazide (ZESTORETIC) 20-25 MG tablet TAKE 1 TABLET BY MOUTH EVERY DAY   Magnesium 250 MG TABS Take 250 mg by mouth daily as needed (leg cramps.).    metFORMIN  (GLUCOPHAGE) 500 MG tablet Take 1 tablet (500 mg total) by mouth 2 (two) times daily with a meal.   montelukast (SINGULAIR) 10 MG tablet Take 1 tablet (10 mg total) by mouth at bedtime.   nitroGLYCERIN (NITROSTAT) 0.4 MG SL tablet Place 1 tablet (0.4 mg total) under the tongue every 5 (five) minutes as needed for chest pain.   VITAMIN D PO Take 1,000 Int'l Units/day by mouth.   No facility-administered encounter medications on file as of 08/09/2021.    Have you had any problems recently with your health? Pt stated she has not encountered any [place] health problems.  Have you had any problems with your pharmacy? Pt stated she is having trouble getting her Albuterol and Ellipta because they are on back order at her pharmacy  What issues or side effects are you having with your medications? Pt stated she is having Some lightheadedness/dizziness but she is unaware which medication may be causing these side effects.  What would you like me to pass along to [name],CPP for them to help you with?  Pt stated she is needing her inhalers filled and wants to see if CPP can get them filled any faster than her usual pharmacy.  What can we do to take care of you better?  Pt states she wants to cut out some of the medications if she doesn't absolutely need them. I advised her that I will inform  CPP as soon as we finish the call and we will reach out with any advice.  Star Rating Drugs: lisinopril (ZESTRIL) 20 MG tablet last filled 06/19/21 90 DS lisinopril-hydrochlorothiazide (ZESTORETIC) 20-25 MG tablet last filled 06/19/21 90 DS metFORMIN (GLUCOPHAGE) 500 MG tablet last filled 04/21/21 90 DS  [place] Clinical Pharmacist Assistant [phone]

## 2021-08-16 ENCOUNTER — Telehealth: Payer: Self-pay

## 2021-08-16 ENCOUNTER — Other Ambulatory Visit: Payer: Self-pay | Admitting: Nurse Practitioner

## 2021-08-16 DIAGNOSIS — E119 Type 2 diabetes mellitus without complications: Secondary | ICD-10-CM

## 2021-08-16 NOTE — Telephone Encounter (Signed)
Attempted to call patient to review request for RF- no answer.

## 2021-08-16 NOTE — Progress Notes (Signed)
Pt confirmed she was able to get medications that were needed from last week. She is feeling better and will call directly if she needs assistance with anything else.  [place] Pharmacist Assistant [phone]

## 2021-08-16 NOTE — Telephone Encounter (Signed)
Copied from [place] [id number]. Topic: Quick Communication - Rx Refill/Question >> Aug 16, 2021  4:45 PM Lenon Curt, Everette A wrote: Medication: metFORMIN (GLUCOPHAGE) 500 MG tablet [[id number]]  Has the patient contacted their pharmacy? Yes.   (Agent: If no, request that the patient contact the pharmacy for the refill.) (Agent: If yes, when and what did the pharmacy advise?)  Preferred Pharmacy (with phone number or street name): [id number] [place], [place]  Phone:  [phone] Fax:  [phone]   Has the patient been seen for an appointment in the last year OR does the patient have an upcoming appointment? Yes.    Agent: Please be advised that RX refills may take up to 3 business days. We ask that you follow-up with your pharmacy.

## 2021-08-16 NOTE — Telephone Encounter (Signed)
Attempted to call patient to review request- left message to call back.  Amlodipine- should not need Rf yet- filled 8/27 #90, Albuterol- 06/23/21 8.5 1RF, and need to clarify dosing on Metformin- dosing request different from current medication list.

## 2021-08-16 NOTE — Telephone Encounter (Signed)
Requested medication (s) are due for refill today - no  Requested medication (s) are on the active medication list -no  Future visit scheduled -yes  Last refill: 03/23/21 #180 3RF  Notes to clinic: Request RF: medication dosing is different than Rx listed on current list- discontinued by office 03/23/21. Attempted to call patient twice- no answer.  Requested Prescriptions  Pending Prescriptions Disp Refills   metFORMIN (GLUCOPHAGE) 1000 MG tablet [Pharmacy Med Name: METFORMIN HCL 1,000 MG TABLET] 180 tablet 1    Sig: TAKE 1 TABLET (1,000 MG TOTAL) BY MOUTH 2 (TWO) TIMES DAILY WITH A MEAL.     Endocrinology:  Diabetes - Biguanides Passed - 08/16/2021  9:43 AM      Passed - Cr in normal range and within 360 days    Creatinine, Ser  Date Value Ref Range Status  05/30/2021 0.75 0.57 - 1.00 mg/dL Final          Passed - HBA1C is between 0 and 7.9 and within 180 days    HB A1C (BAYER DCA - WAIVED)  Date Value Ref Range Status  12/13/2020 6.8 <7.0 % Final    Comment:                                          Diabetic Adult            <7.0                                       Healthy Adult        4.3 - 5.7                                                           (DCCT/NGSP) American Diabetes Association's Summary of Glycemic Recommendations for Adults with Diabetes: Hemoglobin A1c <7.0%. More stringent glycemic goals (A1c <6.0%) may further reduce complications at the cost of increased risk of hypoglycemia.    Hgb A1c MFr Bld  Date Value Ref Range Status  05/30/2021 7.6 (H) 4.8 - 5.6 % Final    Comment:             Prediabetes: 5.7 - 6.4          Diabetes: >6.4          Glycemic control for adults with diabetes: <7.0           Passed - eGFR in normal range and within 360 days    GFR calc Af Amer  Date Value Ref Range Status  12/13/2020 80 >59 mL/min/1.73 Final    Comment:    **In accordance with recommendations from the NKF-ASN Task force,**   [facility] is in the process of  updating its eGFR calculation to the   2021 CKD-EPI creatinine equation that estimates kidney function   without a race variable.    GFR calc non Af Amer  Date Value Ref Range Status  12/13/2020 70 >59 mL/min/1.73 Final   eGFR  Date Value Ref Range Status  05/30/2021 88 >59 mL/min/1.73 Final          Passed - Valid encounter within last  6 months    Recent Outpatient Visits           1 month ago Hypertension associated with diabetes [facility])   [facility] [name], NP   2 months ago Hypertension associated with diabetes [facility])   [facility] [name], NP   4 months ago Type 2 diabetes mellitus with morbid obesity ([place])   [place], Lauren A, NP   5 months ago Acute non-recurrent maxillary sinusitis   [facility] McElwee, Lauren A, NP   8 months ago Type 2 diabetes mellitus with morbid obesity ([place])   [place], Jolene T, NP       Future Appointments             In 2 months [name], NP [place], [facility]            Refused Prescriptions Disp Refills   amLODipine (NORVASC) 5 MG tablet [Pharmacy Med Name: AMLODIPINE BESYLATE 5 MG TAB] 90 tablet 0    Sig: TAKE 1 TABLET (5 MG TOTAL) BY MOUTH DAILY.     Cardiovascular:  Calcium Channel Blockers Passed - 08/16/2021  9:43 AM      Passed - Last BP in normal range    BP Readings from Last 1 Encounters:  07/12/21 130/85          Passed - Valid encounter within last 6 months    Recent Outpatient Visits           1 month ago Hypertension associated with diabetes ([place])   [facility] [name], NP   2 months ago Hypertension associated with diabetes ([place])   [facility] [name], NP   4 months ago Type 2 diabetes mellitus with morbid obesity ([place])   [place], Lauren A, NP   5 months ago Acute non-recurrent maxillary sinusitis   [facility] McElwee, Lauren A, NP   8 months ago Type 2 diabetes mellitus with morbid obesity ([place])   [place], Barbaraann Faster, NP       Future Appointments             In 2 months [name], NP [place], [facility]             albuterol (VENTOLIN HFA) 108 (90 Base) MCG/ACT inhaler [Pharmacy Med Name: ALBUTEROL HFA (PROAIR) INHALER] 8.5 each 1    Sig: TAKE 2 PUFFS BY MOUTH EVERY 6 HOURS AS NEEDED FOR WHEEZE OR SHORTNESS OF BREATH     Pulmonology:  Beta Agonists Failed - 08/16/2021  9:43 AM      Failed - One inhaler should last at least one month. If the patient is requesting refills earlier, contact the patient to check for uncontrolled symptoms.      Passed - Valid encounter within last 12 months    Recent Outpatient Visits           1 month ago Hypertension associated with diabetes [facility])   [facility] [name], NP   2 months ago Hypertension associated with diabetes [facility])   [facility] [name], NP   4 months ago Type 2 diabetes mellitus with morbid obesity ([place])   [place], Lauren A, NP   5 months ago Acute non-recurrent maxillary sinusitis   [facility] McElwee, Lauren A, NP   8 months ago Type 2 diabetes mellitus with morbid obesity ([place])  [facility] [place], Henrine Screws T, NP       Future Appointments             In 2 months [name], NP [facility], [facility]               Requested Prescriptions  Pending Prescriptions Disp Refills   metFORMIN (GLUCOPHAGE) 1000 MG tablet [Pharmacy Med Name: METFORMIN HCL 1,000 MG TABLET] 180 tablet 1    Sig: TAKE 1 TABLET (1,000 MG TOTAL) BY MOUTH 2 (TWO) TIMES DAILY WITH A MEAL.     Endocrinology:  Diabetes - Biguanides Passed - 08/16/2021  9:43 AM      Passed - Cr in normal range and within 360 days    Creatinine, Ser  Date Value Ref Range Status  05/30/2021 0.75 0.57 -  1.00 mg/dL Final          Passed - HBA1C is between 0 and 7.9 and within 180 days    HB A1C (BAYER DCA - WAIVED)  Date Value Ref Range Status  12/13/2020 6.8 <7.0 % Final    Comment:                                          Diabetic Adult            <7.0                                       Healthy Adult        4.3 - 5.7                                                           (DCCT/NGSP) American Diabetes Association's Summary of Glycemic Recommendations for Adults with Diabetes: Hemoglobin A1c <7.0%. More stringent glycemic goals (A1c <6.0%) may further reduce complications at the cost of increased risk of hypoglycemia.    Hgb A1c MFr Bld  Date Value Ref Range Status  05/30/2021 7.6 (H) 4.8 - 5.6 % Final    Comment:             Prediabetes: 5.7 - 6.4          Diabetes: >6.4          Glycemic control for adults with diabetes: <7.0           Passed - eGFR in normal range and within 360 days    GFR calc Af Amer  Date Value Ref Range Status  12/13/2020 80 >59 mL/min/1.73 Final    Comment:    **In accordance with recommendations from the NKF-ASN Task force,**   [facility] is in the process of updating its eGFR calculation to the   2021 CKD-EPI creatinine equation that estimates kidney function   without a race variable.    GFR calc non Af Amer  Date Value Ref Range Status  12/13/2020 70 >59 mL/min/1.73 Final   eGFR  Date Value Ref Range Status  05/30/2021 88 >59 mL/min/1.73 Final          Passed - Valid encounter within last 6 months    Recent Outpatient Visits  1 month ago Hypertension associated with diabetes [facility])   [facility] [name], NP   2 months ago Hypertension associated with diabetes [facility])   [facility] [name], NP   4 months ago Type 2 diabetes mellitus with morbid obesity ([place])   [place], Lauren A, NP   5 months ago Acute non-recurrent maxillary sinusitis   [facility] McElwee, Lauren A, NP   8 months ago Type 2 diabetes mellitus with morbid obesity ([place])   [place], Jolene T, NP       Future Appointments             In 2 months [name], NP [place], [facility]            Refused Prescriptions Disp Refills   amLODipine (NORVASC) 5 MG tablet [Pharmacy Med Name: AMLODIPINE BESYLATE 5 MG TAB] 90 tablet 0    Sig: TAKE 1 TABLET (5 MG TOTAL) BY MOUTH DAILY.     Cardiovascular:  Calcium Channel Blockers Passed - 08/16/2021  9:43 AM      Passed - Last BP in normal range    BP Readings from Last 1 Encounters:  07/12/21 130/85          Passed - Valid encounter within last 6 months    Recent Outpatient Visits           1 month ago Hypertension associated with diabetes ([place])   [facility] [name], NP   2 months ago Hypertension associated with diabetes ([place])   [facility] [name], NP   4 months ago Type 2 diabetes mellitus with morbid obesity ([place])   [place], Lauren A, NP   5 months ago Acute non-recurrent maxillary sinusitis   [facility] McElwee, Lauren A, NP   8 months ago Type 2 diabetes mellitus with morbid obesity ([place])   [place], Barbaraann Faster, NP       Future Appointments             In 2 months [name], NP [place], [facility]             albuterol (VENTOLIN HFA) 108 (90 Base) MCG/ACT inhaler [Pharmacy Med Name: ALBUTEROL HFA (PROAIR) INHALER] 8.5 each 1    Sig: TAKE 2 PUFFS BY MOUTH EVERY 6 HOURS AS NEEDED FOR WHEEZE OR SHORTNESS OF BREATH     Pulmonology:  Beta Agonists Failed - 08/16/2021  9:43 AM      Failed - One inhaler should last at least one month. If the patient is requesting refills earlier, contact the patient to check for uncontrolled symptoms.      Passed - Valid encounter within last 12 months    Recent Outpatient Visits            1 month ago Hypertension associated with diabetes ([place])   [facility] [name], NP   2 months ago Hypertension associated with diabetes [facility])   [facility] [name], NP   4 months ago Type 2 diabetes mellitus with morbid obesity ([place])   [place], Lauren A, NP   5 months ago Acute non-recurrent maxillary sinusitis   [facility] McElwee, Lauren A, NP   8 months ago Type 2 diabetes mellitus with morbid obesity ([place])   [place], Jolene T, NP       Future Appointments  In 2 months [name], NP [facility], [place]

## 2021-08-16 NOTE — Telephone Encounter (Signed)
Requested medication (s) are due for refill today: see encounter  Requested medication (s) are on the active medication list: yes  Last refill:  03/23/21 #180 3 refills  Future visit scheduled: yes In 2 months   Notes to clinic:  please clarify order. [facility] mg that was discontinued 03/23/21. [place] dose 500 mg ordered 03/23/21. Please clarify order.     Requested Prescriptions  Pending Prescriptions Disp Refills   metFORMIN (GLUCOPHAGE) 500 MG tablet 180 tablet 3    Sig: Take 1 tablet (500 mg total) by mouth 2 (two) times daily with a meal.     Endocrinology:  Diabetes - Biguanides Passed - 08/16/2021  5:46 PM      Passed - Cr in normal range and within 360 days    Creatinine, Ser  Date Value Ref Range Status  05/30/2021 0.75 0.57 - 1.00 mg/dL Final          Passed - HBA1C is between 0 and 7.9 and within 180 days    HB A1C (BAYER DCA - WAIVED)  Date Value Ref Range Status  12/13/2020 6.8 <7.0 % Final    Comment:                                          Diabetic Adult            <7.0                                       Healthy Adult        4.3 - 5.7                                                           (DCCT/NGSP) American Diabetes Association's Summary of Glycemic Recommendations for Adults with Diabetes: Hemoglobin A1c <7.0%. More stringent glycemic goals (A1c <6.0%) may further reduce complications at the cost of increased risk of hypoglycemia.    Hgb A1c MFr Bld  Date Value Ref Range Status  05/30/2021 7.6 (H) 4.8 - 5.6 % Final    Comment:             Prediabetes: 5.7 - 6.4          Diabetes: >6.4          Glycemic control for adults with diabetes: <7.0           Passed - eGFR in normal range and within 360 days    GFR calc Af Amer  Date Value Ref Range Status  12/13/2020 80 >59 mL/min/1.73 Final    Comment:    **In accordance with recommendations from the NKF-ASN Task force,**   [facility] is in the process of updating its  eGFR calculation to the   2021 CKD-EPI creatinine equation that estimates kidney function   without a race variable.    GFR calc non Af Amer  Date Value Ref Range Status  12/13/2020 70 >59 mL/min/1.73 Final   eGFR  Date Value Ref Range Status  05/30/2021 88 >59 mL/min/1.73 Final          Passed -  Valid encounter within last 6 months    Recent Outpatient Visits           1 month ago Hypertension associated with diabetes [facility])   [facility] [name], NP   2 months ago Hypertension associated with diabetes [facility])   [facility] [name], NP   4 months ago Type 2 diabetes mellitus with morbid obesity ([place])   [place], Lauren A, NP   5 months ago Acute non-recurrent maxillary sinusitis   [facility] McElwee, Lauren A, NP   8 months ago Type 2 diabetes mellitus with morbid obesity ([place])   [place], Jolene T, NP       Future Appointments             In 2 months [name], NP [facility], [place]

## 2021-08-16 NOTE — Telephone Encounter (Cosign Needed)
Pt confirmed that she has picked up both medications that were needed from last week.

## 2021-08-17 MED ORDER — METFORMIN HCL 500 MG PO TABS: 500.0000 mg | ORAL_TABLET | Freq: Two times a day (BID) | ORAL | 3 refills | Status: DC

## 2021-08-18 ENCOUNTER — Ambulatory Visit: Payer: Self-pay | Admitting: *Deleted

## 2021-08-18 MED ORDER — METFORMIN HCL 1000 MG PO TABS: 1000.0000 mg | ORAL_TABLET | Freq: Two times a day (BID) | ORAL | 1 refills | Status: DC

## 2021-08-18 NOTE — Addendum Note (Signed)
Addended by: [name] on: 08/18/2021 01:49 PM   Modules accepted: Orders

## 2021-08-18 NOTE — Telephone Encounter (Signed)
Routing to provider to advise. Do not see documentation as to why medication was changed.

## 2021-08-18 NOTE — Telephone Encounter (Signed)
Reason for Disposition  [1] Caller has NON-URGENT medicine question about med that PCP prescribed AND [2] triager unable to answer question  Answer Assessment - Initial Assessment Questions 1. NAME of MEDICATION: "What medicine are you calling about?"     Metformin dosing 2. QUESTION: "What is your question?" (e.g., double dose of medicine, side effect)     Patient has been taking metformin 1000 mg/bid and was surprised by decreased dose 3. PRESCRIBING HCP: "Who prescribed it?" Reason: if prescribed by specialist, call should be referred to that group.     PCP 4. SYMPTOMS: "Do you have any symptoms?"     none Patient is calling to report that she has been taking metformin 1000 mg /bid - she never did decrease the dose(OV-May). Patient states her fasting glucose levels are 133/139 and she feels she needs to stay on current dose. Please have provider review and call her back today- she was very upset until I was able to explain the decreased dosing reason. She was unaware and now wants direction.  Protocols used: Medication Question Call-A-AH

## 2021-08-18 NOTE — Telephone Encounter (Signed)
Please call patient back- see note

## 2021-08-18 NOTE — Telephone Encounter (Signed)
Medication dose should not have been changed.  Should still be taking 1000mg  BID.

## 2021-08-18 NOTE — Telephone Encounter (Signed)
Spoke with patient and advised her what her dosage should be and she will contact pharmacy to get corrected amount.

## 2021-08-30 ENCOUNTER — Other Ambulatory Visit: Payer: Self-pay | Admitting: Nurse Practitioner

## 2021-08-30 NOTE — Telephone Encounter (Signed)
Requested Prescriptions  Pending Prescriptions Disp Refills  . gabapentin (NEURONTIN) 100 MG capsule [Pharmacy Med Name: GABAPENTIN 100 MG CAPSULE] 90 capsule 3    Sig: TAKE 1 CAPSULE (100 MG TOTAL) BY MOUTH THREE TIMES DAILY.     Neurology: Anticonvulsants - gabapentin Passed - 08/30/2021 10:14 AM      Passed - Valid encounter within last 12 months    Recent Outpatient Visits          1 month ago Hypertension associated with diabetes ([place])   [facility] [name], NP   3 months ago Hypertension associated with diabetes ([place])   [facility] [name], NP   5 months ago Type 2 diabetes mellitus with morbid obesity ([address])   [place], Lauren A, NP   5 months ago Acute non-recurrent maxillary sinusitis   [facility] McElwee, Lauren A, NP   8 months ago Type 2 diabetes mellitus with morbid obesity ([place])   [place], Jolene T, NP      Future Appointments            In 1 month [name], NP [facility] [place]

## 2021-09-11 ENCOUNTER — Encounter: Payer: Self-pay | Admitting: Nurse Practitioner

## 2021-09-11 ENCOUNTER — Other Ambulatory Visit: Payer: Self-pay

## 2021-09-11 ENCOUNTER — Ambulatory Visit: Payer: Self-pay

## 2021-09-11 ENCOUNTER — Ambulatory Visit (INDEPENDENT_AMBULATORY_CARE_PROVIDER_SITE_OTHER): Payer: BC Managed Care – PPO | Admitting: Nurse Practitioner

## 2021-09-11 VITALS — BP 122/81 | HR 92 | Temp 98.4°F | Resp 18 | Wt 235.6 lb

## 2021-09-11 DIAGNOSIS — R6884 Jaw pain: Secondary | ICD-10-CM

## 2021-09-11 DIAGNOSIS — I209 Angina pectoris, unspecified: Secondary | ICD-10-CM

## 2021-09-11 MED ORDER — PREDNISONE 10 MG PO TABS: ORAL_TABLET | ORAL | 0 refills | Status: DC

## 2021-09-11 MED ORDER — AZITHROMYCIN 250 MG PO TABS: ORAL_TABLET | ORAL | 0 refills | Status: DC

## 2021-09-11 NOTE — Progress Notes (Signed)
Acute Office Visit  Subjective:    Patient ID: Meredith Butler, female    DOB: November 12, 1956, 65 y.o.   MRN: [id number]  Chief Complaint  Patient presents with   Jaw Pain   HPI Patient is in today for right jaw and neck pain, swelling, and redness. She was at [facility] this morning and was eating a burger and chili and felt a sudden pain in the right side of her jaw and neck. Then it started turning red and swelling. She states the pain is a 7/10 and it is sharp when she chews something. She routinely goes to a dentist and wears upper partials. She denies shortness of breath and chest pain. She thinks she is having an allergic reaction to something she ate. She has not tried anything to help her symptoms.   Past Medical History:  Diagnosis Date   Asthma    Diabetes mellitus without complication ([place])    Fibromyalgia    Hypertension    Lupus ([place])    Peritonitis ([place])     Past Surgical History:  Procedure Laterality Date   ABDOMINAL HYSTERECTOMY     complete   ABDOMINAL SURGERY     APPENDECTOMY     CHOLECYSTECTOMY     COLON SURGERY     JOINT REPLACEMENT Bilateral    knee   LEFT HEART CATH AND CORONARY ANGIOGRAPHY N/A 01/05/2020   Procedure: LEFT HEART CATH AND CORONARY ANGIOGRAPHY;  Surgeon: Martinique, Peter M, MD;  Location: [facility];  Service: Cardiovascular;  Laterality: N/A;   SMALL INTESTINE SURGERY     SPLENECTOMY, TOTAL     TONSILLECTOMY      Family History  Problem Relation Age of Onset   Heart attack Mother    Cancer Mother    Diabetes Mother    Heart attack Father    Diabetes Sister    Diabetes Daughter    Cancer Maternal Grandmother        ovarian   Cancer Paternal Grandfather        colon   Cancer Sister        lung   Diabetes Daughter    Diabetes Maternal Aunt    Diabetes Maternal Uncle     Social History   Socioeconomic History   Marital status: Married    Spouse name: Not on file   Number of children: Not on file   Years of education: Not on  file   Highest education level: Not on file  Occupational History   Not on file  Tobacco Use   Smoking status: Former    Packs/day: 2.00    Years: 30.00    Pack years: 60.00    Types: Cigarettes    Quit date: 11/20/2009    Years since quitting: 11.8   Smokeless tobacco: Never  Vaping Use   Vaping Use: Never used  Substance and Sexual Activity   Alcohol use: No   Drug use: No   Sexual activity: Yes  Other Topics Concern   Not on file  Social History Narrative   Not on file   Social Determinants of Health   Financial Resource Strain: Not on file  Food Insecurity: Not on file  Transportation Needs: Not on file  Physical Activity: Not on file  Stress: Not on file  Social Connections: Not on file  Intimate Partner Violence: Not on file    Outpatient Medications Prior to Visit  Medication Sig Dispense Refill   albuterol (VENTOLIN HFA) 108 (90  Base) MCG/ACT inhaler TAKE 2 PUFFS BY MOUTH EVERY 6 HOURS AS NEEDED FOR WHEEZE OR SHORTNESS OF BREATH 8.5 each 1   amLODipine (NORVASC) 5 MG tablet TAKE 1 TABLET (5 MG TOTAL) BY MOUTH DAILY. 90 tablet 0   ANORO ELLIPTA 62.5-25 MCG/INH AEPB TAKE 1 PUFF BY MOUTH EVERY DAY 60 each 2   aspirin 325 MG tablet Take 325 mg by mouth daily.     EPINEPHRINE 0.3 mg/0.3 mL IJ SOAJ injection INJECT 0.3 MLS (0.3 MG TOTAL) INTO THE MUSCLE AS NEEDED FOR ANAPHYLAXIS. 2 each 2   ezetimibe (ZETIA) 10 MG tablet TAKE 1 TABLET BY MOUTH EVERY DAY 90 tablet 1   gabapentin (NEURONTIN) 100 MG capsule TAKE 1 CAPSULE (100 MG TOTAL) BY MOUTH THREE TIMES DAILY. 90 capsule 3   hydroxypropyl methylcellulose / hypromellose (ISOPTO TEARS / GONIOVISC) 2.5 % ophthalmic solution 1 drop.     lisinopril (ZESTRIL) 20 MG tablet Take 1 tablet (20 mg total) by mouth daily. 90 tablet 1   lisinopril-hydrochlorothiazide (ZESTORETIC) 20-25 MG tablet TAKE 1 TABLET BY MOUTH EVERY DAY 90 tablet 1   Magnesium 250 MG TABS Take 250 mg by mouth daily as needed (leg cramps.).      metFORMIN  (GLUCOPHAGE) 1000 MG tablet Take 1 tablet (1,000 mg total) by mouth 2 (two) times daily with a meal. 180 tablet 1   montelukast (SINGULAIR) 10 MG tablet Take 1 tablet (10 mg total) by mouth at bedtime. 90 tablet 1   VITAMIN D PO Take 1,000 Int'l Units/day by mouth.     glucose blood test strip Use as instructed 100 each 12   nitroGLYCERIN (NITROSTAT) 0.4 MG SL tablet Place 1 tablet (0.4 mg total) under the tongue every 5 (five) minutes as needed for chest pain. 90 tablet 3   No facility-administered medications prior to visit.    Allergies  Allergen Reactions   Claforan [Cefotaxime] Anaphylaxis   Ibuprofen Anaphylaxis   Omnicef [Cefdinir] Anaphylaxis   Penicillins Anaphylaxis    Has patient had a PCN reaction causing immediate rash, facial/tongue/throat swelling, SOB or lightheadedness with hypotension: Yes Has patient had a PCN reaction causing severe rash involving mucus membranes or skin necrosis: No Has patient had a PCN reaction that required hospitalization: Yes Has patient had a PCN reaction occurring within the last 10 years: Yes If all of the above answers are "NO", then may proceed with Cephalosporin use.    Sulfa Antibiotics Anaphylaxis and Shortness Of Breath   Tetanus Toxoids Anaphylaxis   Voltaren [Diclofenac Sodium] Anaphylaxis   Codeine     Makes her crazy    Diovan [Valsartan] Swelling   Rocephin [Ceftriaxone]     Eyes turn blood red    Statins     myalgias    Review of Systems  Constitutional: Negative.   HENT:         Right jaw pain, redness, and swelling  Respiratory: Negative.    Cardiovascular: Negative.   Skin:        Redness and swelling to right jaw      Objective:    Physical Exam Vitals and nursing note reviewed.  Constitutional:      General: She is not in acute distress.    Appearance: Normal appearance.  HENT:     Head: Normocephalic.     Comments: Right side of jaw and neck with erythema and slight edema. No open areas to skin. No  obvious abscess in tooth/gums Eyes:     Conjunctiva/sclera: Conjunctivae  normal.  Cardiovascular:     Rate and Rhythm: Normal rate and regular rhythm.     Pulses: Normal pulses.     Heart sounds: Normal heart sounds.  Pulmonary:     Effort: Pulmonary effort is normal.     Breath sounds: Normal breath sounds.  Musculoskeletal:     Cervical back: Normal range of motion.  Skin:    General: Skin is warm.  Neurological:     General: No focal deficit present.     Mental Status: She is alert and oriented to person, place, and time.  Psychiatric:        Mood and Affect: Mood normal.        Behavior: Behavior normal.        Thought Content: Thought content normal.        Judgment: Judgment normal.    BP 122/81 (BP Location: Right Arm, Patient Position: Sitting)   Pulse 92   Temp 98.4 F (36.9 C) (Oral)   Resp 18   Wt 235 lb 9.6 oz (106.9 kg)   SpO2 95%   BMI 35.06 kg/m  Wt Readings from Last 3 Encounters:  09/11/21 235 lb 9.6 oz (106.9 kg)  07/12/21 233 lb 8 oz (105.9 kg)  05/30/21 236 lb 9.6 oz (107.3 kg)    Health Maintenance Due  Topic Date Due   COVID-19 Vaccine (1) Never done   Pneumonia Vaccine 74+ Years old (1 - PCV) Never done   FOOT EXAM  Never done   Zoster Vaccines- Shingrix (1 of 2) Never done   MAMMOGRAM  Never done   INFLUENZA VACCINE  Never done   OPHTHALMOLOGY EXAM  07/15/2021    There are no preventive care reminders to display for this patient.   Lab Results  Component Value Date   TSH 2.700 12/13/2020   Lab Results  Component Value Date   WBC 6.7 12/28/2019   HGB 14.9 12/28/2019   HCT 44.2 12/28/2019   MCV 90 12/28/2019   PLT 329 12/28/2019   Lab Results  Component Value Date   NA 138 05/30/2021   K 4.4 05/30/2021   CO2 23 05/30/2021   GLUCOSE 144 (H) 05/30/2021   BUN 15 05/30/2021   CREATININE 0.75 05/30/2021   BILITOT 0.5 05/30/2021   ALKPHOS 68 05/30/2021   AST 26 05/30/2021   ALT 33 (H) 05/30/2021   PROT 7.0 05/30/2021    ALBUMIN 4.6 05/30/2021   CALCIUM 10.0 05/30/2021   ANIONGAP 8 04/04/2017   EGFR 88 05/30/2021   Lab Results  Component Value Date   CHOL 156 05/30/2021   Lab Results  Component Value Date   HDL 37 (L) 05/30/2021   Lab Results  Component Value Date   LDLCALC 71 05/30/2021   Lab Results  Component Value Date   TRIG 301 (H) 05/30/2021   Lab Results  Component Value Date   CHOLHDL 4.2 05/30/2021   Lab Results  Component Value Date   HGBA1C 7.6 (H) 05/30/2021       Assessment & Plan:   Problem List Items Addressed This Visit       Other   Jaw pain - Primary    Acute, occurred while eating. Most likely related to tooth crack/abscess vs TMJ pain. Recommend she follow up with dentist as soon as possible. Will treat with azithromycin as she has numerous antibiotic allergies and prednisone. Follow up with any concerns or worsening symptoms.         Meds ordered  this encounter  Medications   azithromycin (ZITHROMAX Z-PAK) 250 MG tablet    Sig: Take 2 tablets today and then 1 the rest of the days    Dispense:  6 each    Refill:  0   predniSONE (DELTASONE) 10 MG tablet    Sig: Take 6 tablets today, then 5 tablets tomorrow, then decrease by 1 tablet every day until gone    Dispense:  21 tablet    Refill:  0      Charyl Dancer, NP

## 2021-09-11 NOTE — Telephone Encounter (Signed)
Pt c/o right facial swelling. Swelling starts from  right ear lobe to under jaw. Onset was sudden and started 30 minutes ago.  Pain is moderate and sharp. Denies ear pain. Spouse stated it may be a swollen lymph node. Pt stated this has occurred in the past with ear infection. Care advice given and pt verbalized understanding. Appt given for today.         Reason for Disposition  Face swelling is painful to touch  Answer Assessment - Initial Assessment Questions 1. ONSET: "When did the swelling start?" (e.g., minutes, hours, days)     This am  2. LOCATION: "What part of the face is swollen?"     Right side of face midway up to ear. To below jaw 3. SEVERITY: "How swollen is it?"     notification 4. ITCHING: "Is there any itching?" If Yes, ask: "How much?"   (Scale 1-10; mild, moderate or severe)     no 5. PAIN: "Is the swelling painful to touch?" If Yes, ask: "How painful is it?"   (Scale 1-10; mild, moderate or severe)   - NONE (0): no pain   - MILD (1-3): doesn't interfere with normal activities    - MODERATE (4-7): interferes with normal activities or awakens from sleep    - SEVERE (8-10): excruciating pain, unable to do any normal activities      Moderate-sharp to the jaw where ear and jaw  6. FEVER: "Do you have a fever?" If Yes, ask: "What is it, how was it measured, and when did it start?"      no 7. CAUSE: "What do you think is causing the face swelling?"     Allergy- look like a swollen lymph node 8. RECURRENT SYMPTOM: "Have you had face swelling before?" If Yes, ask: "When was the last time?" "What happened that time?"     With a bad ear  9. OTHER SYMPTOMS: "Do you have any other symptoms?" (e.g., toothache, leg swelling)     Right ear itching 10. PREGNANCY: "Is there any chance you are pregnant?" "When was your last menstrual period?"       N/a  Protocols used: Face Swelling-A-AH

## 2021-09-12 DIAGNOSIS — R6884 Jaw pain: Secondary | ICD-10-CM | POA: Insufficient documentation

## 2021-09-12 HISTORY — DX: Jaw pain: R68.84

## 2021-09-12 NOTE — Assessment & Plan Note (Signed)
Acute, occurred while eating. Most likely related to tooth crack/abscess vs TMJ pain. Recommend she follow up with dentist as soon as possible. Will treat with azithromycin as she has numerous antibiotic allergies and prednisone. Follow up with any concerns or worsening symptoms.

## 2021-10-17 ENCOUNTER — Ambulatory Visit (INDEPENDENT_AMBULATORY_CARE_PROVIDER_SITE_OTHER): Payer: BC Managed Care – PPO | Admitting: Nurse Practitioner

## 2021-10-17 ENCOUNTER — Encounter: Payer: Self-pay | Admitting: Nurse Practitioner

## 2021-10-17 ENCOUNTER — Other Ambulatory Visit: Payer: Self-pay

## 2021-10-17 VITALS — BP 136/86 | HR 75 | Temp 98.2°F | Ht 69.0 in | Wt 235.6 lb

## 2021-10-17 DIAGNOSIS — Z1231 Encounter for screening mammogram for malignant neoplasm of breast: Secondary | ICD-10-CM

## 2021-10-17 DIAGNOSIS — Z0000 Encounter for general adult medical examination without abnormal findings: Secondary | ICD-10-CM | POA: Diagnosis not present

## 2021-10-17 DIAGNOSIS — J432 Centrilobular emphysema: Secondary | ICD-10-CM | POA: Diagnosis not present

## 2021-10-17 DIAGNOSIS — I209 Angina pectoris, unspecified: Secondary | ICD-10-CM

## 2021-10-17 DIAGNOSIS — E1169 Type 2 diabetes mellitus with other specified complication: Secondary | ICD-10-CM

## 2021-10-17 DIAGNOSIS — E1159 Type 2 diabetes mellitus with other circulatory complications: Secondary | ICD-10-CM | POA: Diagnosis not present

## 2021-10-17 DIAGNOSIS — F339 Major depressive disorder, recurrent, unspecified: Secondary | ICD-10-CM

## 2021-10-17 DIAGNOSIS — I152 Hypertension secondary to endocrine disorders: Secondary | ICD-10-CM

## 2021-10-17 DIAGNOSIS — I700 Atherosclerosis of aorta: Secondary | ICD-10-CM | POA: Diagnosis not present

## 2021-10-17 DIAGNOSIS — E785 Hyperlipidemia, unspecified: Secondary | ICD-10-CM

## 2021-10-17 NOTE — Assessment & Plan Note (Signed)
Chronic.  Controlled.  Continue with current medication regimen on Metformin 1000mg  BID.  Labs ordered today.  Return to clinic in 6 months for reevaluation.  Call sooner if concerns arise.

## 2021-10-17 NOTE — Assessment & Plan Note (Signed)
Recommend a healthy lifestyle through diet and exercise. Recommend smaller more frequent meals that prioritize protein.

## 2021-10-17 NOTE — Assessment & Plan Note (Signed)
Chronic.  Controlled.  Continue with current medication regimen.  Anoro made patient more tired.  Labs ordered today.  Return to clinic in 6 months for reevaluation.  Call sooner if concerns arise.

## 2021-10-17 NOTE — Patient Instructions (Signed)
Please call to schedule your mammogram and/or bone density: °[facility]  °Address: [address], [place], [place] [address]  °Phone: [phone] ° °

## 2021-10-17 NOTE — Assessment & Plan Note (Signed)
Chronic.  Controlled.  Continue with current medication regimen on Zetia 10mg .  Labs ordered today.  Return to clinic in 6 months for reevaluation.  Call sooner if concerns arise.

## 2021-10-17 NOTE — Assessment & Plan Note (Signed)
Chronic.  Controlled.  Continue with current medication regimen of Zetia 10mg.  Labs ordered today.  Return to clinic in 6 months for reevaluation.  Call sooner if concerns arise.   

## 2021-10-17 NOTE — Progress Notes (Deleted)
 There were no vitals taken for this visit.   Subjective:    Patient ID: Meredith Butler, female    DOB: 10/12/1956, 65 y.o.   MRN: [id number]  HPI: Meredith Butler is a 65 y.o. female  No chief complaint on file.  HYPERTENSION Hypertension status: controlled  Satisfied with current treatment? yes Duration of hypertension: years BP monitoring frequency:  daily BP range: 130/86 BP medication side effects:  no Medication compliance: excellent compliance Previous BP meds:amlodipine, lisinopril, and lisinopril-HCTZ Aspirin: yes Recurrent headaches: yes Visual changes: no Palpitations: no Dyspnea: yes Chest pain: yes Lower extremity edema: no Dizzy/lightheaded: no  DIABETES Hypoglycemic episodes:{Blank single:19197::"yes","no"} Polydipsia/polyuria: {Blank single:19197::"yes","no"} Visual disturbance: {Blank single:19197::"yes","no"} Chest pain: {Blank single:19197::"yes","no"} Paresthesias: {Blank single:19197::"yes","no"} Glucose Monitoring: {Blank single:19197::"yes","no"}  Accucheck frequency: {Blank single:19197::"Not Checking","Daily","BID","TID"}  Fasting glucose:  Post prandial:  Evening:  Before meals: Taking Insulin?: {Blank single:19197::"yes","no"}  Long acting insulin:  Short acting insulin: Blood Pressure Monitoring: {Blank single:19197::"not checking","rarely","daily","weekly","monthly","a few times a day","a few times a week","a few times a month"} Retinal Examination: {Blank single:19197::"Up to Date","Not up to Date"} Foot Exam: {Blank single:19197::"Up to Date","Not up to Date"} Diabetic Education: {Blank single:19197::"Completed","Not Completed"} Pneumovax: {Blank single:19197::"Up to Date","Not up to Date","unknown"} Influenza: {Blank single:19197::"Up to Date","Not up to Date","unknown"} Aspirin: {Blank single:19197::"yes","no"}  COPD COPD status: {Blank single:19197::"controlled","uncontrolled","better","worse","exacerbated","stable"} Satisfied with current  treatment?: {Blank single:19197::"yes","no"} Oxygen use: {Blank single:19197::"yes","no"} Dyspnea frequency:  Cough frequency:  Rescue inhaler frequency:   Limitation of activity: {Blank single:19197::"yes","no"} Productive cough:  Last Spirometry:  Pneumovax: {Blank single:19197::"Up to Date","Not up to Date","unknown"} Influenza: {Blank single:19197::"Up to Date","Not up to Date","unknown"}  DEPRESSION  Relevant past medical, surgical, family and social history reviewed and updated as indicated. Interim medical history since our last visit reviewed. Allergies and medications reviewed and updated.  Review of Systems  HENT:  Positive for congestion and rhinorrhea.        Ear fullness  Eyes:  Negative for visual disturbance.  Respiratory:  Positive for shortness of breath. Negative for cough and chest tightness.   Cardiovascular:  Positive for chest pain. Negative for palpitations and leg swelling.  Neurological:  Positive for headaches. Negative for dizziness.   Per HPI unless specifically indicated above     Objective:    There were no vitals taken for this visit.  Wt Readings from Last 3 Encounters:  09/11/21 235 lb 9.6 oz (106.9 kg)  07/12/21 233 lb 8 oz (105.9 kg)  05/30/21 236 lb 9.6 oz (107.3 kg)    Physical Exam Vitals and nursing note reviewed.  Constitutional:      General: She is not in acute distress.    Appearance: Normal appearance. She is normal weight. She is not ill-appearing, toxic-appearing or diaphoretic.  HENT:     Head: Normocephalic.     Right Ear: Tympanic membrane, ear canal and external ear normal.     Left Ear: Tympanic membrane, ear canal and external ear normal.     Nose: Congestion present.     Right Sinus: Maxillary sinus tenderness present. No frontal sinus tenderness.     Left Sinus: Maxillary sinus tenderness present. No frontal sinus tenderness.     Mouth/Throat:     Mouth: Mucous membranes are moist.     Pharynx: Oropharynx is clear.   Eyes:     General:        Right eye: No discharge.        Left eye: No discharge.     Extraocular Movements: Extraocular movements intact.       Conjunctiva/sclera: Conjunctivae normal.     Pupils: Pupils are equal, round, and reactive to light.  Cardiovascular:     Rate and Rhythm: Normal rate and regular rhythm.     Heart sounds: No murmur heard. Pulmonary:     Effort: Pulmonary effort is normal. No respiratory distress.     Breath sounds: Normal breath sounds. No wheezing or rales.  Musculoskeletal:     Cervical back: Normal range of motion and neck supple.  Skin:    General: Skin is warm and dry.     Capillary Refill: Capillary refill takes less than 2 seconds.  Neurological:     General: No focal deficit present.     Mental Status: She is alert and oriented to person, place, and time. Mental status is at baseline.  Psychiatric:        Mood and Affect: Mood normal.        Behavior: Behavior normal.        Thought Content: Thought content normal.        Judgment: Judgment normal.    Results for orders placed or performed in visit on 05/30/21  Comp Met (CMET)  Result Value Ref Range   Glucose 144 (H) 65 - 99 mg/dL   BUN 15 8 - 27 mg/dL   Creatinine, Ser 0.75 0.57 - 1.00 mg/dL   eGFR 88 >59 mL/min/1.73   BUN/Creatinine Ratio 20 12 - 28   Sodium 138 134 - 144 mmol/L   Potassium 4.4 3.5 - 5.2 mmol/L   Chloride 98 96 - 106 mmol/L   CO2 23 20 - 29 mmol/L   Calcium 10.0 8.7 - 10.3 mg/dL   Total Protein 7.0 6.0 - 8.5 g/dL   Albumin 4.6 3.8 - 4.8 g/dL   Globulin, Total 2.4 1.5 - 4.5 g/dL   Albumin/Globulin Ratio 1.9 1.2 - 2.2   Bilirubin Total 0.5 0.0 - 1.2 mg/dL   Alkaline Phosphatase 68 44 - 121 IU/L   AST 26 0 - 40 IU/L   ALT 33 (H) 0 - 32 IU/L  HgB A1c  Result Value Ref Range   Hgb A1c MFr Bld 7.6 (H) 4.8 - 5.6 %   Est. average glucose Bld gHb Est-mCnc 171 mg/dL  Lipid Profile  Result Value Ref Range   Cholesterol, Total 156 100 - 199 mg/dL   Triglycerides 301  (H) 0 - 149 mg/dL   HDL 37 (L) >39 mg/dL   VLDL Cholesterol Cal 48 (H) 5 - 40 mg/dL   LDL Chol Calc (NIH) 71 0 - 99 mg/dL   Chol/HDL Ratio 4.2 0.0 - 4.4 ratio      Assessment & Plan:   Problem List Items Addressed This Visit       Cardiovascular and Mediastinum   Aortic atherosclerosis (HCC)   Hypertension associated with diabetes (HCC) - Primary     Respiratory   Centrilobular emphysema (HCC)     Endocrine   Type 2 diabetes mellitus with morbid obesity (HCC)   Hyperlipidemia associated with type 2 diabetes mellitus (HCC)     Other   Morbid obesity (HCC)   Depression, recurrent (HCC)     Follow up plan: No follow-ups on file.    A total of 30 minutes were spent on this encounter today.  When total time is documented, this includes both the face-to-face and non-face-to-face time personally spent before, during and after the visit on the date of the encounter.     

## 2021-10-17 NOTE — Progress Notes (Addendum)
BP 136/86   Pulse 75   Temp 98.2 F (36.8 C) (Oral)   Ht _0  (1.753 m)   Wt 235 lb 9.6 oz (106.9 kg)   SpO2 96%   BMI 34.79 kg/m    Subjective:    Patient ID: Meredith Butler, female    DOB: 03-03-1956, 65 y.o.   MRN: [id number]  HPI: Meredith Butler is a 65 y.o. female presenting on 10/17/2021 for comprehensive medical examination. Current medical complaints include:none  She currently lives with: Menopausal Symptoms: no  HYPERTENSION Hypertension status: controlled  Satisfied with current treatment? yes Duration of hypertension: years BP monitoring frequency:  daily BP range: 134/88 BP medication side effects:  no Medication compliance: excellent compliance Previous BP meds:amlodipine, lisinopril, and lisinopril-HCTZ Aspirin: yes Recurrent headaches: yes Visual changes: no Palpitations: no Dyspnea: yes Chest pain: yes Lower extremity edema: no Dizzy/lightheaded: no  DIABETES States the streroids increased her sugars to over 300. Hypoglycemic episodes:yes Polydipsia/polyuria: increased thirstiness Visual disturbance: no Chest pain: no Paresthesias: yes- feet Glucose Monitoring: yes  Accucheck frequency: Daily  Fasting glucose: 154  Post prandial:  Evening:  Before meals: Taking Insulin?: no  Long acting insulin:  Short acting insulin: Blood Pressure Monitoring: daily Retinal Examination: Not up to Date Foot Exam: Up to Date Diabetic Education: Not Completed Pneumovax: Refused Influenza: Refused Aspirin: yes  COPD COPD status: controlled Satisfied with current treatment?: felt tired when she was taking anoro.  Missed it for 2 days and felt a lot better without it.  Oxygen use: no Dyspnea frequency: none Cough frequency: none Rescue inhaler frequency:  1-2 times daily Limitation of activity: sometimes Productive cough: no Last Spirometry:  Pneumovax: Refused Influenza: Refused  DEPRESSION Patient states her mood is good.  She is stressed this morning  due to being late but otherwise feels good. Denies SI.  Depression Screen done today and results listed below:  Depression screen [facility] 2/9 10/17/2021 07/12/2021 05/30/2021 03/23/2021 12/13/2020  Decreased Interest 0 0 0 1 0  Down, Depressed, Hopeless 0 0 0 1 0  PHQ - 2 Score 0 0 0 2 0  Altered sleeping 0 - 1 3 0  Tired, decreased energy 0 - 1 3 0  Change in appetite 0 - 1 3 0  Feeling bad or failure about yourself  0 - 0 0 0  Trouble concentrating 0 - 0 0 0  Moving slowly or fidgety/restless 0 - 0 0 0  Suicidal thoughts 0 - 0 0 0  PHQ-9 Score 0 - 3 11 0  Difficult doing work/chores Not difficult at all - Not difficult at all Not difficult at all -  Some recent data might be hidden    The patient does not have a history of falls. I did complete a risk assessment for falls. A plan of care for falls was documented.   Past Medical History:  Past Medical History:  Diagnosis Date   Asthma    Diabetes mellitus without complication ([place])    Fibromyalgia    Lupus ([place])    Peritonitis ([place])     Surgical History:  Past Surgical History:  Procedure Laterality Date   ABDOMINAL SURGERY     APPENDECTOMY     CHOLECYSTECTOMY     COLON SURGERY     JOINT REPLACEMENT Bilateral    knee   LEFT HEART CATH AND CORONARY ANGIOGRAPHY N/A 01/05/2020   Procedure: LEFT HEART CATH AND CORONARY ANGIOGRAPHY;  Surgeon: Martinique, Peter M, MD;  [facility];  Service: Cardiovascular;  Laterality: N/A;   SMALL INTESTINE SURGERY     SPLENECTOMY, TOTAL     TONSILLECTOMY     TOTAL ABDOMINAL HYSTERECTOMY W/ BILATERAL SALPINGOOPHORECTOMY     complete    Medications:  Current Outpatient Medications on File Prior to Visit  Medication Sig   albuterol (VENTOLIN HFA) 108 (90 Base) MCG/ACT inhaler TAKE 2 PUFFS BY MOUTH EVERY 6 HOURS AS NEEDED FOR WHEEZE OR SHORTNESS OF BREATH   amLODipine (NORVASC) 5 MG tablet TAKE 1 TABLET (5 MG TOTAL) BY MOUTH DAILY.   aspirin 325 MG tablet Take 325 mg by mouth  daily.   EPINEPHRINE 0.3 mg/0.3 mL IJ SOAJ injection INJECT 0.3 MLS (0.3 MG TOTAL) INTO THE MUSCLE AS NEEDED FOR ANAPHYLAXIS.   ezetimibe (ZETIA) 10 MG tablet TAKE 1 TABLET BY MOUTH EVERY DAY   gabapentin (NEURONTIN) 100 MG capsule TAKE 1 CAPSULE (100 MG TOTAL) BY MOUTH THREE TIMES DAILY.   glucose blood test strip Use as instructed   hydroxypropyl methylcellulose / hypromellose (ISOPTO TEARS / GONIOVISC) 2.5 % ophthalmic solution 1 drop.   lisinopril (ZESTRIL) 20 MG tablet Take 1 tablet (20 mg total) by mouth daily.   lisinopril-hydrochlorothiazide (ZESTORETIC) 20-25 MG tablet TAKE 1 TABLET BY MOUTH EVERY DAY   Magnesium 250 MG TABS Take 250 mg by mouth daily as needed (leg cramps.).    metFORMIN (GLUCOPHAGE) 1000 MG tablet Take 1 tablet (1,000 mg total) by mouth 2 (two) times daily with a meal.   montelukast (SINGULAIR) 10 MG tablet Take 1 tablet (10 mg total) by mouth at bedtime.   VITAMIN D PO Take 1,000 Int'l Units/day by mouth.   nitroGLYCERIN (NITROSTAT) 0.4 MG SL tablet Place 1 tablet (0.4 mg total) under the tongue every 5 (five) minutes as needed for chest pain.   No current facility-administered medications on file prior to visit.    Allergies:  Allergies  Allergen Reactions   Claforan [Cefotaxime] Anaphylaxis   Ibuprofen Anaphylaxis   Omnicef [Cefdinir] Anaphylaxis   Penicillins Anaphylaxis    Has patient had a PCN reaction causing immediate rash, facial/tongue/throat swelling, SOB or lightheadedness with hypotension: Yes Has patient had a PCN reaction causing severe rash involving mucus membranes or skin necrosis: No Has patient had a PCN reaction that required hospitalization: Yes Has patient had a PCN reaction occurring within the last 10 years: Yes If all of the above answers are "NO", then may proceed with Cephalosporin use.    Sulfa Antibiotics Anaphylaxis and Shortness Of Breath   Tetanus Toxoids Anaphylaxis   Voltaren [Diclofenac Sodium] Anaphylaxis   Codeine      Makes her crazy    Diovan [Valsartan] Swelling   Rocephin [Ceftriaxone]     Eyes turn blood red    Statins     myalgias    Social History:  Social History   Socioeconomic History   Marital status: Married    Spouse name: Not on file   Number of children: Not on file   Years of education: Not on file   Highest education level: Not on file  Occupational History   Not on file  Tobacco Use   Smoking status: Former    Packs/day: 2.00    Years: 30.00    Pack years: 60.00    Types: Cigarettes    Quit date: 11/20/2009    Years since quitting: 11.9   Smokeless tobacco: Never  Vaping Use   Vaping Use: Never used  Substance and Sexual Activity  Alcohol use: No   Drug use: No   Sexual activity: Yes  Other Topics Concern   Not on file  Social History Narrative   Not on file   Social Determinants of Health   Financial Resource Strain: Not on file  Food Insecurity: Not on file  Transportation Needs: Not on file  Physical Activity: Not on file  Stress: Not on file  Social Connections: Not on file  Intimate Partner Violence: Not on file   Social History   Tobacco Use  Smoking Status Former   Packs/day: 2.00   Years: 30.00   Pack years: 60.00   Types: Cigarettes   Quit date: 11/20/2009   Years since quitting: 11.9  Smokeless Tobacco Never   Social History   Substance and Sexual Activity  Alcohol Use No    Family History:  Family History  Problem Relation Age of Onset   Heart attack Mother    Cancer Mother    Diabetes Mother    Heart attack Father    Diabetes Sister    Diabetes Daughter    Cancer Maternal Grandmother        ovarian   Cancer Paternal Grandfather        colon   Cancer Sister        lung   Diabetes Daughter    Diabetes Maternal Aunt    Diabetes Maternal Uncle     Past medical history, surgical history, medications, allergies, family history and social history reviewed with patient today and changes made to appropriate areas of the chart.    Review of Systems  HENT:         Denies vision changes.  Eyes:  Negative for blurred vision and double vision.  Respiratory:  Negative for shortness of breath.   Cardiovascular:  Negative for chest pain, palpitations and leg swelling.  Neurological:  Negative for dizziness, tingling and headaches.  Endo/Heme/Allergies:  Negative for polydipsia.       Denies Polyuria  All other ROS negative except what is listed above and in the HPI.      Objective:    BP 136/86   Pulse 75   Temp 98.2 F (36.8 C) (Oral)   Ht _0  (1.753 m)   Wt 235 lb 9.6 oz (106.9 kg)   SpO2 96%   BMI 34.79 kg/m   Wt Readings from Last 3 Encounters:  10/17/21 235 lb 9.6 oz (106.9 kg)  09/11/21 235 lb 9.6 oz (106.9 kg)  07/12/21 233 lb 8 oz (105.9 kg)    Physical Exam Vitals and nursing note reviewed.  Constitutional:      General: She is awake. She is not in acute distress.    Appearance: She is well-developed. She is not ill-appearing.  HENT:     Head: Normocephalic and atraumatic.     Right Ear: Hearing, tympanic membrane, ear canal and external ear normal. No drainage.     Left Ear: Hearing, tympanic membrane, ear canal and external ear normal. No drainage.     Nose: Nose normal.     Right Sinus: No maxillary sinus tenderness or frontal sinus tenderness.     Left Sinus: No maxillary sinus tenderness or frontal sinus tenderness.     Mouth/Throat:     Mouth: Mucous membranes are moist.     Pharynx: Oropharynx is clear. Uvula midline. No pharyngeal swelling, oropharyngeal exudate or posterior oropharyngeal erythema.  Eyes:     General: Lids are normal.  Right eye: No discharge.        Left eye: No discharge.     Extraocular Movements: Extraocular movements intact.     Conjunctiva/sclera: Conjunctivae normal.     Pupils: Pupils are equal, round, and reactive to light.     Visual Fields: Right eye visual fields normal and left eye visual fields normal.  Neck:     Thyroid: No thyromegaly.      Vascular: No carotid bruit.     Trachea: Trachea normal.  Cardiovascular:     Rate and Rhythm: Normal rate and regular rhythm.     Heart sounds: Normal heart sounds. No murmur heard.   No gallop.  Pulmonary:     Effort: Pulmonary effort is normal. No accessory muscle usage or respiratory distress.     Breath sounds: Normal breath sounds.  Chest:  Breasts:    Right: Normal.     Left: Normal.  Abdominal:     General: Bowel sounds are normal.     Palpations: Abdomen is soft. There is no hepatomegaly or splenomegaly.     Tenderness: There is no abdominal tenderness.  Musculoskeletal:        General: Normal range of motion.     Cervical back: Normal range of motion and neck supple.     Right lower leg: No edema.     Left lower leg: No edema.  Lymphadenopathy:     Head:     Right side of head: No submental, submandibular, tonsillar, preauricular or posterior auricular adenopathy.     Left side of head: No submental, submandibular, tonsillar, preauricular or posterior auricular adenopathy.     Cervical: No cervical adenopathy.     Upper Body:     Right upper body: No supraclavicular, axillary or pectoral adenopathy.     Left upper body: No supraclavicular, axillary or pectoral adenopathy.  Skin:    General: Skin is warm and dry.     Capillary Refill: Capillary refill takes less than 2 seconds.     Findings: No rash.  Neurological:     Mental Status: She is alert and oriented to person, place, and time.     Gait: Gait is intact.     Deep Tendon Reflexes: Reflexes are normal and symmetric.     Reflex Scores:      Brachioradialis reflexes are 2+ on the right side and 2+ on the left side.      Patellar reflexes are 2+ on the right side and 2+ on the left side. Psychiatric:        Attention and Perception: Attention normal.        Mood and Affect: Mood normal.        Speech: Speech normal.        Behavior: Behavior normal. Behavior is cooperative.        Thought Content:  Thought content normal.        Judgment: Judgment normal.    Results for orders placed or performed in visit on 10/17/21  Comp Met (CMET)  Result Value Ref Range   Glucose 172 (H) 70 - 99 mg/dL   BUN 8 8 - 27 mg/dL   Creatinine, Ser 0.75 0.57 - 1.00 mg/dL   eGFR 88 >59 mL/min/1.73   BUN/Creatinine Ratio 11 (L) 12 - 28   Sodium 140 134 - 144 mmol/L   Potassium 4.1 3.5 - 5.2 mmol/L   Chloride 100 96 - 106 mmol/L   CO2 25 20 - 29 mmol/L  Calcium 9.9 8.7 - 10.3 mg/dL   Total Protein 6.8 6.0 - 8.5 g/dL   Albumin 4.4 3.8 - 4.8 g/dL   Globulin, Total 2.4 1.5 - 4.5 g/dL   Albumin/Globulin Ratio 1.8 1.2 - 2.2   Bilirubin Total 0.7 0.0 - 1.2 mg/dL   Alkaline Phosphatase 61 44 - 121 IU/L   AST 27 0 - 40 IU/L   ALT 35 (H) 0 - 32 IU/L  HgB A1c  Result Value Ref Range   Hgb A1c MFr Bld 8.6 (H) 4.8 - 5.6 %   Est. average glucose Bld gHb Est-mCnc 200 mg/dL  Lipid Profile  Result Value Ref Range   Cholesterol, Total 161 100 - 199 mg/dL   Triglycerides 228 (H) 0 - 149 mg/dL   HDL 39 (L) >39 mg/dL   VLDL Cholesterol Cal 38 5 - 40 mg/dL   LDL Chol Calc (NIH) 84 0 - 99 mg/dL   Chol/HDL Ratio 4.1 0.0 - 4.4 ratio      Assessment & Plan:   Problem List Items Addressed This Visit       Cardiovascular and Mediastinum   Aortic atherosclerosis (HCC)    Chronic.  Controlled.  Continue with current medication regimen of Zetia 82m.  Labs ordered today.  Return to clinic in 6 months for reevaluation.  Call sooner if concerns arise.        Hypertension associated with diabetes ([place]    Chronic.  Controlled.  Continue with current medication regimen of Amlodipine 544m Lisinoprol 4044mand HCTZ 35m84mLabs ordered today.  Return to clinic in 6 months for reevaluation.  Call sooner if concerns arise.        Relevant Orders   Comp Met (CMET) (Completed)     Respiratory   Centrilobular emphysema (HCC)    Chronic.  Controlled.  Continue with current medication regimen.  Anoro made patient more  tired.  Labs ordered today.  Return to clinic in 6 months for reevaluation.  Call sooner if concerns arise.          Endocrine   Type 2 diabetes mellitus with morbid obesity (HCC)    Chronic.  Controlled.  Continue with current medication regimen on Metformin 1000mg87m.  Labs ordered today.  Return to clinic in 6 months for reevaluation.  Call sooner if concerns arise.        Relevant Orders   HgB A1c (Completed)   Hyperlipidemia associated with type 2 diabetes mellitus (HCC)    Chronic.  Controlled.  Continue with current medication regimen on Zetia 10mg.26mbs ordered today.  Return to clinic in 6 months for reevaluation.  Call sooner if concerns arise.        Relevant Orders   Lipid Profile (Completed)     Other   Morbid obesity (HCC)  [place] healthy lifestyle through diet and exercise. Recommend smaller more frequent meals that prioritize protein.      Depression, recurrent (HCC)    Chronic.  Controlled without medication at this time.  Labs ordered today.  Return to clinic in 6 months for reevaluation.  Call sooner if concerns arise.        Other Visit Diagnoses     Annual physical exam    -  Primary   Health maintenance reviewed during visit.  Refused vaccines.  Labs ordered.  Mammogram ordered.    Encounter for screening mammogram for malignant neoplasm of breast       Relevant Orders  MM Digital Screening        Follow up plan: Return in about 6 months (around 04/16/2022) for HTN, HLD, DM2 FU.   LABORATORY TESTING:  - Pap smear: not applicable  IMMUNIZATIONS:   - Tdap: Tetanus vaccination status reviewed: Refused. - Influenza: Refused - Pneumovax: Refused - Prevnar: Refused - COVID: Refused - HPV: Not applicable - Shingrix vaccine: Refused  SCREENING: -Mammogram: Ordered today  - Colonoscopy: Refused  - Bone Density: Refused  -Hearing Test: Not applicable  -Spirometry: Not applicable   PATIENT COUNSELING:   Advised to take 1 mg of  folate supplement per day if capable of pregnancy.   Sexuality: Discussed sexually transmitted diseases, partner selection, use of condoms, avoidance of unintended pregnancy  and contraceptive alternatives.   Advised to avoid cigarette smoking.  I discussed with the patient that most people either abstain from alcohol or drink within safe limits (<=14/week and <=4 drinks/occasion for males, <=7/weeks and <= 3 drinks/occasion for females) and that the risk for alcohol disorders and other health effects rises proportionally with the number of drinks per week and how often a drinker exceeds daily limits.  Discussed cessation/primary prevention of drug use and availability of treatment for abuse.   Diet: Encouraged to adjust caloric intake to maintain  or achieve ideal body weight, to reduce intake of dietary saturated fat and total fat, to limit sodium intake by avoiding high sodium foods and not adding table salt, and to maintain adequate dietary potassium and calcium preferably from fresh fruits, vegetables, and low-fat dairy products.    stressed the importance of regular exercise  Injury prevention: Discussed safety belts, safety helmets, smoke detector, smoking near bedding or upholstery.   Dental health: Discussed importance of regular tooth brushing, flossing, and dental visits.    NEXT PREVENTATIVE PHYSICAL DUE IN 1 YEAR. Return in about 6 months (around 04/16/2022) for HTN, HLD, DM2 FU.

## 2021-10-17 NOTE — Assessment & Plan Note (Signed)
Chronic.  Controlled.  Continue with current medication regimen of Amlodipine 5mg , Lisinoprol 40mg , and HCTZ 25mg .  Labs ordered today.  Return to clinic in 6 months for reevaluation.  Call sooner if concerns arise.

## 2021-10-17 NOTE — Assessment & Plan Note (Signed)
Chronic.  Controlled without medication at this time.  Labs ordered today.  Return to clinic in 6 months for reevaluation.  Call sooner if concerns arise.

## 2021-10-18 LAB — HEMOGLOBIN A1C
Est. average glucose Bld gHb Est-mCnc: 200 mg/dL
Hgb A1c MFr Bld: 8.6 % — ABNORMAL HIGH (ref 4.8–5.6)

## 2021-10-18 LAB — COMPREHENSIVE METABOLIC PANEL
ALT: 35 IU/L — ABNORMAL HIGH (ref 0–32)
AST: 27 IU/L (ref 0–40)
Albumin/Globulin Ratio: 1.8 (ref 1.2–2.2)
Albumin: 4.4 g/dL (ref 3.8–4.8)
Alkaline Phosphatase: 61 IU/L (ref 44–121)
BUN/Creatinine Ratio: 11 — ABNORMAL LOW (ref 12–28)
BUN: 8 mg/dL (ref 8–27)
Bilirubin Total: 0.7 mg/dL (ref 0.0–1.2)
CO2: 25 mmol/L (ref 20–29)
Calcium: 9.9 mg/dL (ref 8.7–10.3)
Chloride: 100 mmol/L (ref 96–106)
Creatinine, Ser: 0.75 mg/dL (ref 0.57–1.00)
Globulin, Total: 2.4 g/dL (ref 1.5–4.5)
Glucose: 172 mg/dL — ABNORMAL HIGH (ref 70–99)
Potassium: 4.1 mmol/L (ref 3.5–5.2)
Sodium: 140 mmol/L (ref 134–144)
Total Protein: 6.8 g/dL (ref 6.0–8.5)
eGFR: 88 mL/min/{1.73_m2} (ref >59)

## 2021-10-18 LAB — LIPID PANEL
Chol/HDL Ratio: 4.1 ratio (ref 0.0–4.4)
Cholesterol, Total: 161 mg/dL (ref 100–199)
HDL: 39 mg/dL — ABNORMAL LOW (ref >39)
LDL Chol Calc (NIH): 84 mg/dL (ref 0–99)
Triglycerides: 228 mg/dL — ABNORMAL HIGH (ref 0–149)
VLDL Cholesterol Cal: 38 mg/dL (ref 5–40)

## 2021-10-18 NOTE — Progress Notes (Signed)
Please let patient know that her lab work shows that her A1c increased from 7.6 to 8.6.  Since she was recently on the steroids we will wait to adjust any medications to see if this returns to her usual 7.6.  I would like her to monitor her carbohydrate intake closely and return to clinic in 3 months versus 6 month to make sure this returns to normal. Otherwise, he blood work looks good.  Please move patient's appointment up. Let me know if she has any questions.

## 2021-11-04 ENCOUNTER — Other Ambulatory Visit: Payer: Self-pay | Admitting: Nurse Practitioner

## 2021-11-05 NOTE — Telephone Encounter (Signed)
Requested Prescriptions  Pending Prescriptions Disp Refills   ezetimibe (ZETIA) 10 MG tablet [Pharmacy Med Name: EZETIMIBE 10 MG TABLET] 90 tablet 1    Sig: TAKE 1 TABLET BY MOUTH EVERY DAY     Cardiovascular:  Antilipid - Sterol Transport Inhibitors Failed - 11/04/2021  5:11 PM      Failed - HDL in normal range and within 360 days    HDL  Date Value Ref Range Status  10/17/2021 39 (L) >39 mg/dL Final         Failed - Triglycerides in normal range and within 360 days    Triglycerides  Date Value Ref Range Status  10/17/2021 228 (H) 0 - 149 mg/dL Final         Passed - Total Cholesterol in normal range and within 360 days    Cholesterol, Total  Date Value Ref Range Status  10/17/2021 161 100 - 199 mg/dL Final         Passed - LDL in normal range and within 360 days    LDL Chol Calc (NIH)  Date Value Ref Range Status  10/17/2021 84 0 - 99 mg/dL Final         Passed - Valid encounter within last 12 months    Recent Outpatient Visits          2 weeks ago Annual physical exam   [facility] [name], NP   1 month ago Jaw pain   [facility] McElwee, Lauren A, NP   3 months ago Hypertension associated with diabetes ([place])   [facility] [name], NP   5 months ago Hypertension associated with diabetes ([place])   [facility] [name], NP   7 months ago Type 2 diabetes mellitus with morbid obesity ([place])   [place], Scheryl Darter, NP      Future Appointments            In 2 months [name], NP [facility], [place]

## 2021-11-16 ENCOUNTER — Other Ambulatory Visit: Payer: Self-pay | Admitting: Nurse Practitioner

## 2021-11-16 NOTE — Telephone Encounter (Signed)
Requested Prescriptions  Pending Prescriptions Disp Refills   albuterol (VENTOLIN HFA) 108 (90 Base) MCG/ACT inhaler [Pharmacy Med Name: ALBUTEROL HFA (PROAIR) INHALER] 8.5 each 1    Sig: TAKE 2 PUFFS BY MOUTH EVERY 6 HOURS AS NEEDED FOR WHEEZE OR SHORTNESS OF BREATH     Pulmonology:  Beta Agonists Failed - 11/16/2021 11:28 AM      Failed - One inhaler should last at least one month. If the patient is requesting refills earlier, contact the patient to check for uncontrolled symptoms.      Passed - Valid encounter within last 12 months    Recent Outpatient Visits          1 month ago Annual physical exam   [place], NP   2 months ago Jaw pain   [facility] McElwee, Lauren A, NP   4 months ago Hypertension associated with diabetes [facility])   [facility] [name], NP   5 months ago Hypertension associated with diabetes [facility])   [facility] [name], NP   7 months ago Type 2 diabetes mellitus with morbid obesity ([place])   [place], Scheryl Darter, NP      Future Appointments            In 1 month [name], NP [facility], [place]

## 2021-11-17 ENCOUNTER — Other Ambulatory Visit: Payer: Self-pay | Admitting: Nurse Practitioner

## 2021-11-17 NOTE — Telephone Encounter (Signed)
Requested Prescriptions  Pending Prescriptions Disp Refills   amLODipine (NORVASC) 5 MG tablet [Pharmacy Med Name: AMLODIPINE BESYLATE 5 MG TAB] 90 tablet 1    Sig: TAKE 1 TABLET (5 MG TOTAL) BY MOUTH DAILY.     Cardiovascular:  Calcium Channel Blockers Passed - 11/17/2021  2:36 PM      Passed - Last BP in normal range    BP Readings from Last 1 Encounters:  10/17/21 136/86         Passed - Valid encounter within last 6 months    Recent Outpatient Visits          1 month ago Annual physical exam   [facility] [name], NP   2 months ago Jaw pain   [facility] McElwee, Lauren A, NP   4 months ago Hypertension associated with diabetes ([place])   [facility] [name], NP   5 months ago Hypertension associated with diabetes ([place])   [facility] [name], NP   7 months ago Type 2 diabetes mellitus with morbid obesity ([place])   [place], Lauren Loni Muse, NP      Future Appointments            In 1 month [name], NP [place], [facility]            montelukast (SINGULAIR) 10 MG tablet [Pharmacy Med Name: MONTELUKAST SOD 10 MG TABLET] 90 tablet 1    Sig: TAKE 1 TABLET BY MOUTH EVERYDAY AT BEDTIME     Pulmonology:  Leukotriene Inhibitors Passed - 11/17/2021  2:36 PM      Passed - Valid encounter within last 12 months    Recent Outpatient Visits          1 month ago Annual physical exam   [facility] [name], NP   2 months ago Jaw pain   [place], Lauren A, NP   4 months ago Hypertension associated with diabetes ([place])   [facility] [name], NP   5 months ago Hypertension associated with diabetes ([place])   [facility] [name], NP   7 months ago Type 2 diabetes mellitus with morbid obesity ([place])   [place], Lauren A, NP      Future Appointments             In 1 month [name], NP [place], [facility]            lisinopril-hydrochlorothiazide (ZESTORETIC) 20-25 MG tablet [Pharmacy Med Name: LISINOPRIL-HCTZ 20-25 MG TAB] 90 tablet 1    Sig: TAKE 1 TABLET BY MOUTH EVERY DAY     Cardiovascular:  ACEI + Diuretic Combos Passed - 11/17/2021  2:36 PM      Passed - Na in normal range and within 180 days    Sodium  Date Value Ref Range Status  10/17/2021 140 134 - 144 mmol/L Final         Passed - K in normal range and within 180 days    Potassium  Date Value Ref Range Status  10/17/2021 4.1 3.5 - 5.2 mmol/L Final         Passed - Cr in normal range and within 180 days    Creatinine, Ser  Date Value Ref Range Status  10/17/2021 0.75 0.57 - 1.00 mg/dL Final         Passed - Ca in normal range and within 180 days  Calcium  Date Value Ref Range Status  10/17/2021 9.9 8.7 - 10.3 mg/dL Final         Passed - Patient is not pregnant      Passed - Last BP in normal range    BP Readings from Last 1 Encounters:  10/17/21 136/86         Passed - Valid encounter within last 6 months    Recent Outpatient Visits          1 month ago Annual physical exam   [place], NP   2 months ago Jaw pain   [facility] McElwee, Lauren A, NP   4 months ago Hypertension associated with diabetes ([place])   [facility] [name], NP   5 months ago Hypertension associated with diabetes [facility])   [facility] [name], NP   7 months ago Type 2 diabetes mellitus with morbid obesity ([place])   [place], Scheryl Darter, NP      Future Appointments            In 1 month [name], NP [facility], [place]

## 2021-11-17 NOTE — Telephone Encounter (Signed)
Requested medication (s) are on the active medication list: yes  Future visit scheduled: 01/08/22  Notes to clinic:  FYI, pt is on two Lisinopril prescriptions, both current at same pharm, one with HCTZ, was not sure if was to be on both. Please assess. Requested Prescriptions  Pending Prescriptions Disp Refills   lisinopril-hydrochlorothiazide (ZESTORETIC) 20-25 MG tablet [Pharmacy Med Name: LISINOPRIL-HCTZ 20-25 MG TAB] 90 tablet 1    Sig: TAKE 1 TABLET BY MOUTH EVERY DAY     Cardiovascular:  ACEI + Diuretic Combos Passed - 11/17/2021  2:36 PM      Passed - Na in normal range and within 180 days    Sodium  Date Value Ref Range Status  10/17/2021 140 134 - 144 mmol/L Final          Passed - K in normal range and within 180 days    Potassium  Date Value Ref Range Status  10/17/2021 4.1 3.5 - 5.2 mmol/L Final          Passed - Cr in normal range and within 180 days    Creatinine, Ser  Date Value Ref Range Status  10/17/2021 0.75 0.57 - 1.00 mg/dL Final          Passed - Ca in normal range and within 180 days    Calcium  Date Value Ref Range Status  10/17/2021 9.9 8.7 - 10.3 mg/dL Final          Passed - Patient is not pregnant      Passed - Last BP in normal range    BP Readings from Last 1 Encounters:  10/17/21 136/86          Passed - Valid encounter within last 6 months    Recent Outpatient Visits           1 month ago Annual physical exam   [facility] [name], NP   2 months ago Jaw pain   [facility] McElwee, Lauren A, NP   4 months ago Hypertension associated with diabetes ([place])   [facility] [name], NP   5 months ago Hypertension associated with diabetes ([place])   [facility] [name], NP   7 months ago Type 2 diabetes mellitus with morbid obesity ([place])   [place], Scheryl Darter, NP       Future Appointments             In 1 month [name], NP [facility], [facility]            Signed Prescriptions Disp Refills   amLODipine (NORVASC) 5 MG tablet 90 tablet 1    Sig: TAKE 1 TABLET (5 MG TOTAL) BY MOUTH DAILY.     Cardiovascular:  Calcium Channel Blockers Passed - 11/17/2021  2:36 PM      Passed - Last BP in normal range    BP Readings from Last 1 Encounters:  10/17/21 136/86          Passed - Valid encounter within last 6 months    Recent Outpatient Visits           1 month ago Annual physical exam   [place], NP   2 months ago Jaw pain   [facility] McElwee, Lauren A, NP   4 months ago Hypertension associated with diabetes [facility])   [facility] [name], NP   5 months ago Hypertension associated with diabetes (  [place])   [facility] [name], NP   7 months ago Type 2 diabetes mellitus with morbid obesity ([place])   [place], Scheryl Darter, NP       Future Appointments             In 1 month [name], NP [facility], [facility]             montelukast (SINGULAIR) 10 MG tablet 90 tablet 1    Sig: TAKE 1 TABLET BY MOUTH EVERYDAY AT BEDTIME     Pulmonology:  Leukotriene Inhibitors Passed - 11/17/2021  2:36 PM      Passed - Valid encounter within last 12 months    Recent Outpatient Visits           1 month ago Annual physical exam   [place], NP   2 months ago Jaw pain   [facility] McElwee, Lauren A, NP   4 months ago Hypertension associated with diabetes ([place])   [facility] [name], NP   5 months ago Hypertension associated with diabetes [facility])   [facility] [name], NP   7 months ago Type 2 diabetes mellitus with morbid obesity ([place])   [place], Scheryl Darter, NP       Future Appointments             In 1 month [name], NP [facility], [place]

## 2021-11-21 ENCOUNTER — Other Ambulatory Visit: Payer: Self-pay | Admitting: Nurse Practitioner

## 2021-11-21 NOTE — Telephone Encounter (Signed)
Unsure as to wether or not she should be taking Lisinopril with HCTZ or Lisinopril ? Not states Lisinopril 20mg  and HCTZ 25mg 

## 2021-11-22 NOTE — Telephone Encounter (Signed)
Requested Prescriptions  Pending Prescriptions Disp Refills   lisinopril (ZESTRIL) 20 MG tablet [Pharmacy Med Name: LISINOPRIL 20 MG TABLET] 90 tablet 0    Sig: TAKE 1 TABLET BY MOUTH EVERY DAY     Cardiovascular:  ACE Inhibitors Passed - 11/21/2021 12:18 PM      Passed - Cr in normal range and within 180 days    Creatinine, Ser  Date Value Ref Range Status  10/17/2021 0.75 0.57 - 1.00 mg/dL Final         Passed - K in normal range and within 180 days    Potassium  Date Value Ref Range Status  10/17/2021 4.1 3.5 - 5.2 mmol/L Final         Passed - Patient is not pregnant      Passed - Last BP in normal range    BP Readings from Last 1 Encounters:  10/17/21 136/86         Passed - Valid encounter within last 6 months    Recent Outpatient Visits          1 month ago Annual physical exam   [place], NP   2 months ago Jaw pain   [facility] McElwee, Lauren A, NP   4 months ago Hypertension associated with diabetes ([place])   [facility] [name], NP   5 months ago Hypertension associated with diabetes ([place])   [facility] [name], NP   8 months ago Type 2 diabetes mellitus with morbid obesity ([place])   [place], Scheryl Darter, NP      Future Appointments            In 1 month [name], NP [facility], [place]

## 2021-12-12 IMAGING — DX DG WRIST COMPLETE 3+V*R*
4 series · 4 of 4 positions shown · non-contrast
Comparison: Bilateral hands dated 01/29/2018.

CLINICAL DATA: Posterior radial right wrist pain and palpable mass.
No known injury.

EXAM:
RIGHT WRIST - COMPLETE 3+ VIEW

[wrist ap]
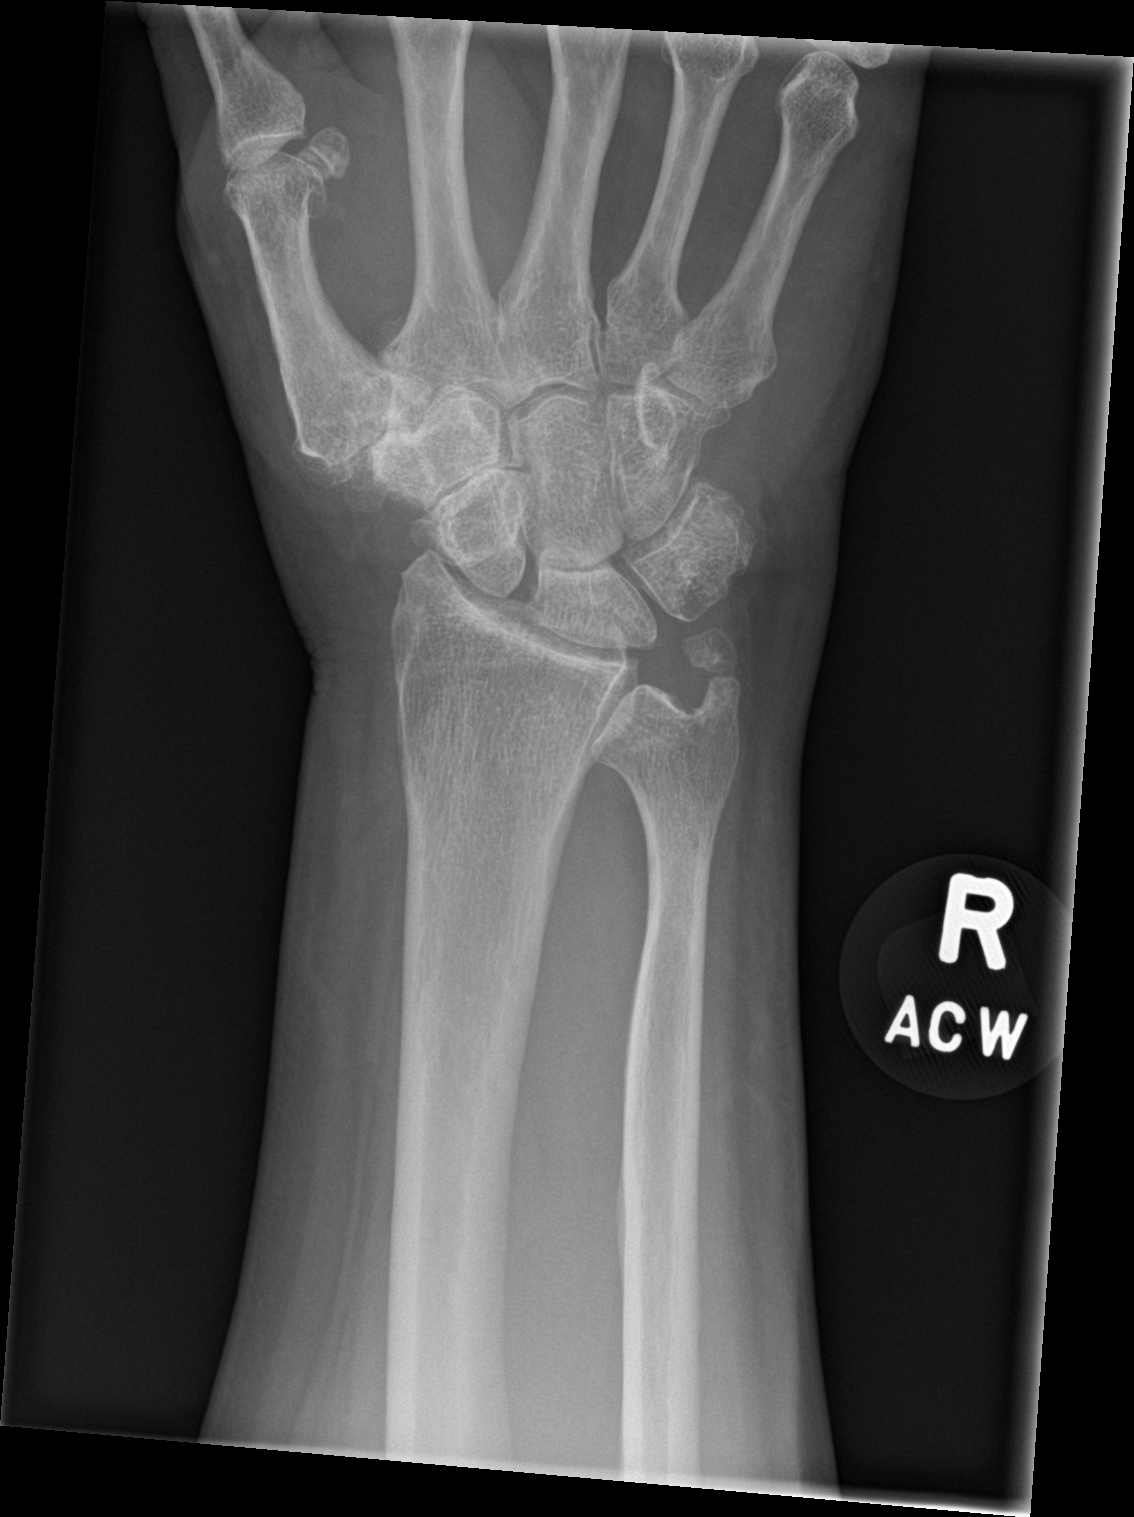

[wrist obl]
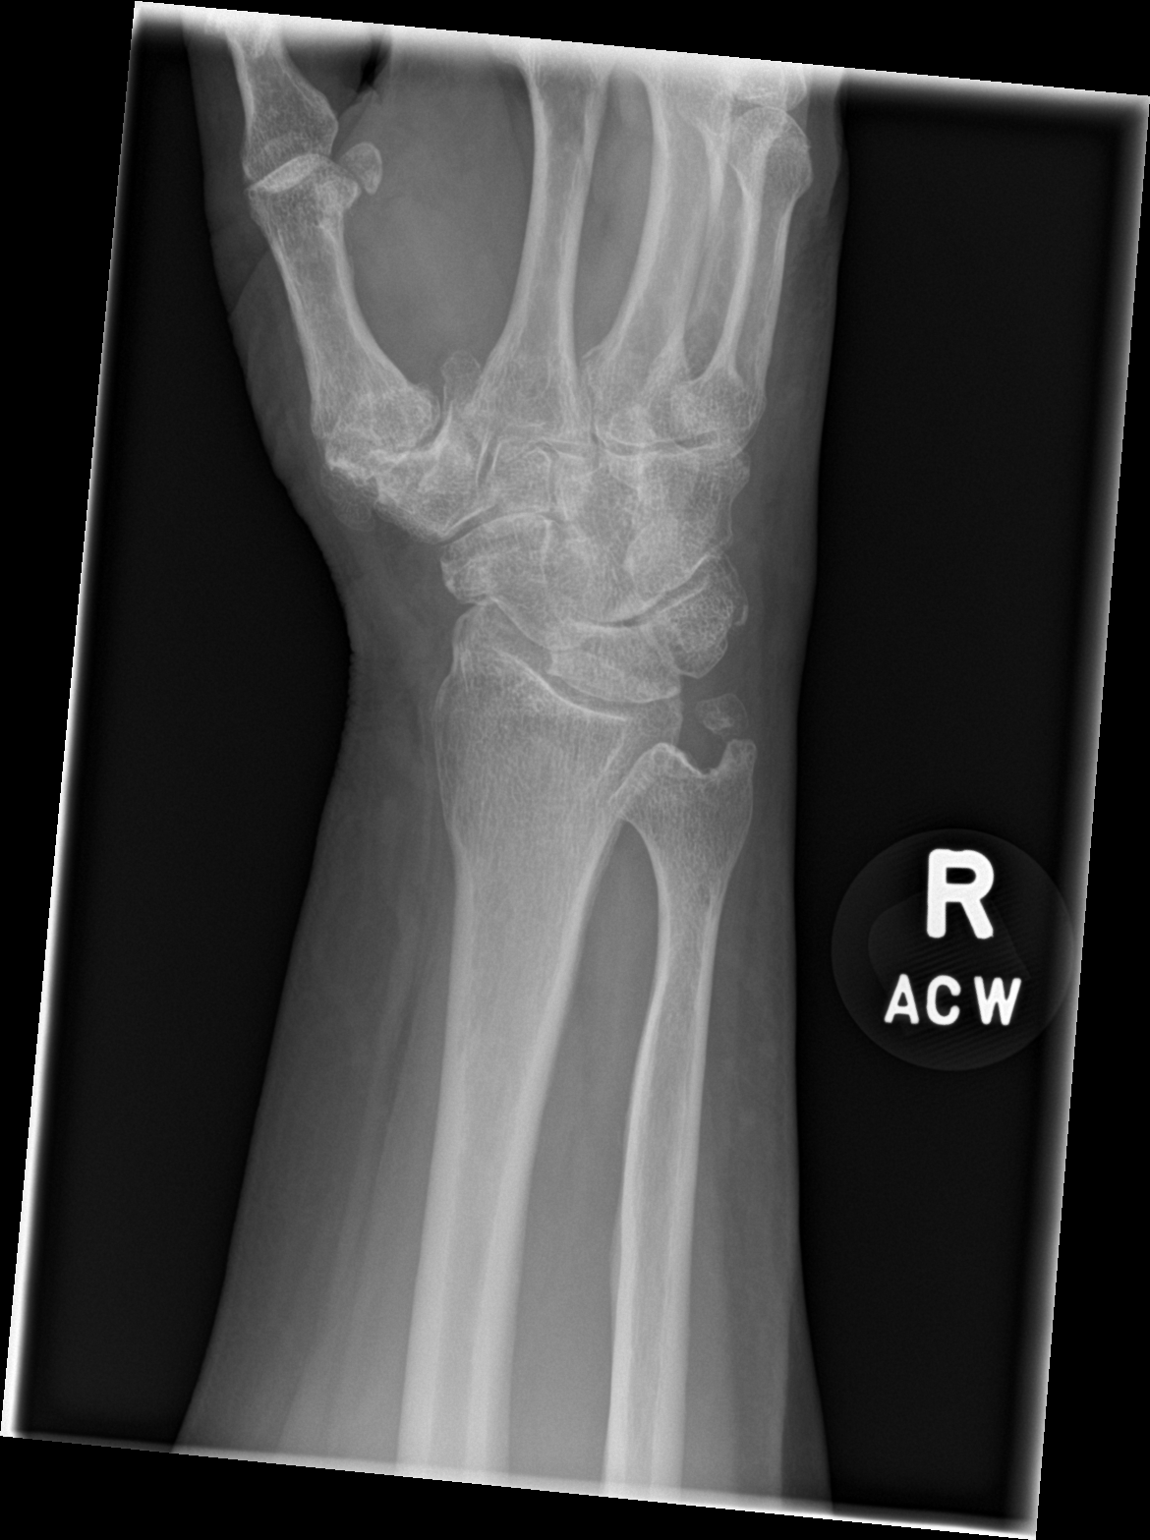

[wrist lat]
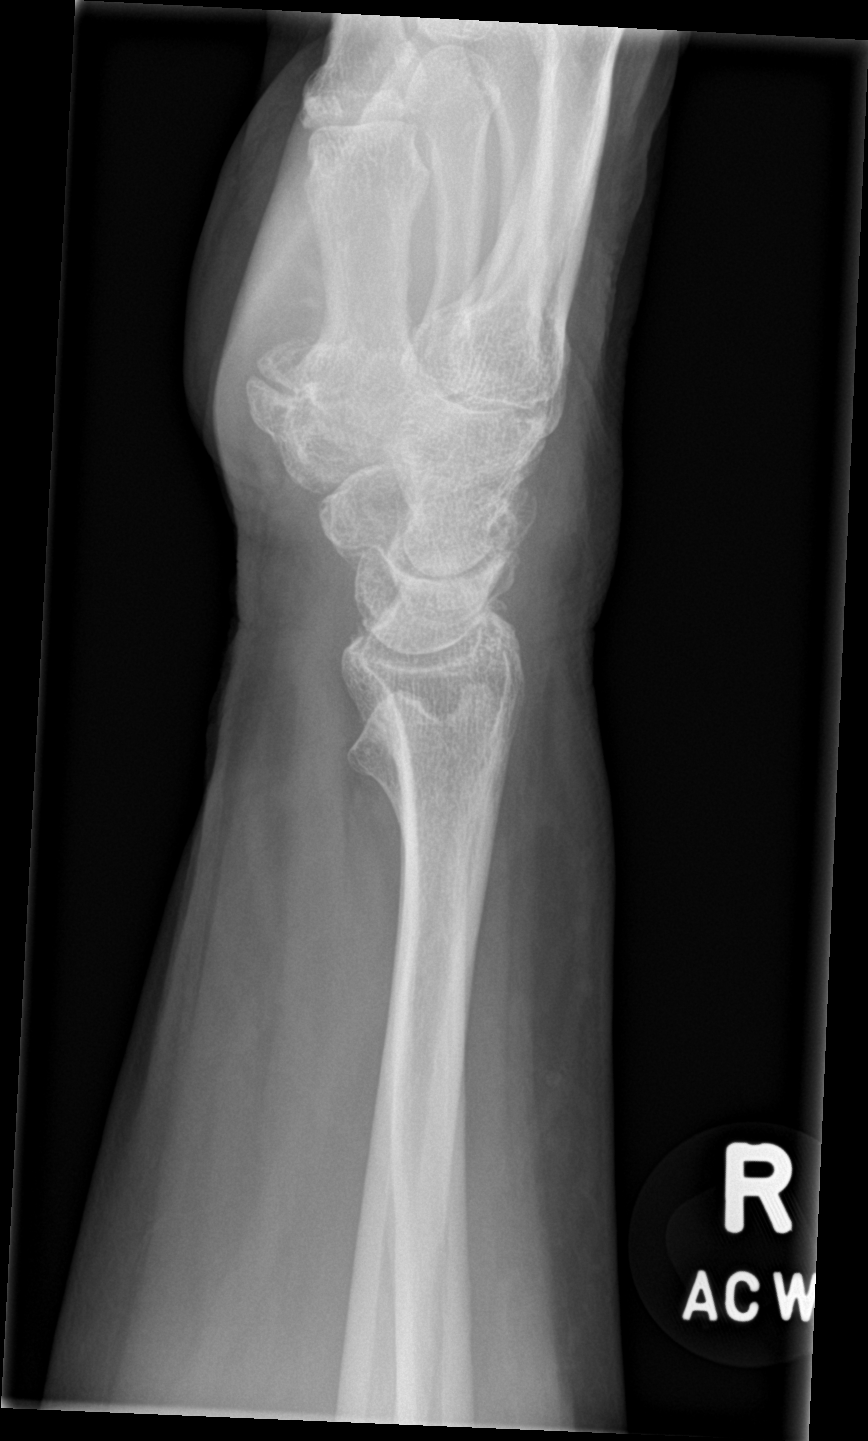

[wrist navicular]
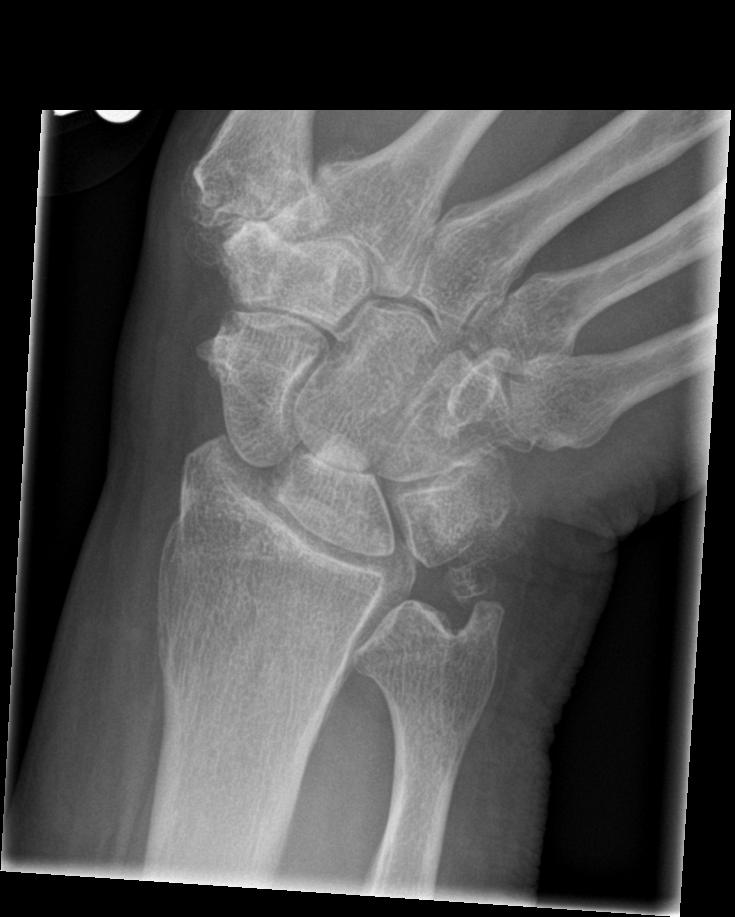

[4 of 4 positions shown; findings below may reference images not displayed]

FINDINGS: Again demonstrated are marked degenerative changes at the 1st
metacarpal/carpal joint with some progression. Shortening of the
distal ulna relative to the distal radius is again demonstrated with
chronic corticated fragmentation of the ulnar styloid. Interval
mild-to-moderate radiocarpal joint degenerative changes. There are
also degenerative changes involving the articulation of the
trapezium and trapezoid with the scaphoid and degenerative changes
at the articulation of the capitate and lunate.
IMPRESSION: 1. Progressive wrist degenerative changes involving multiple joints,
as described above. These changes are most severe at the 1st
metacarpal/carpal joint with progressive spur formation.
2. Stable negative ulnar variance.

## 2021-12-20 ENCOUNTER — Ambulatory Visit: Payer: BC Managed Care – PPO | Admitting: Nurse Practitioner

## 2022-01-04 ENCOUNTER — Other Ambulatory Visit: Payer: Self-pay | Admitting: Nurse Practitioner

## 2022-01-04 NOTE — Telephone Encounter (Signed)
Requested Prescriptions  Pending Prescriptions Disp Refills   albuterol (VENTOLIN HFA) 108 (90 Base) MCG/ACT inhaler [Pharmacy Med Name: ALBUTEROL HFA (PROAIR) INHALER] 8.5 each 1    Sig: TAKE 2 PUFFS BY MOUTH EVERY 6 HOURS AS NEEDED FOR WHEEZE OR SHORTNESS OF BREATH     Pulmonology:  Beta Agonists 2 Passed - 01/04/2022  1:27 AM      Passed - Last BP in normal range    BP Readings from Last 1 Encounters:  10/17/21 136/86         Passed - Last Heart Rate in normal range    Pulse Readings from Last 1 Encounters:  10/17/21 75         Passed - Valid encounter within last 12 months    Recent Outpatient Visits          2 months ago Annual physical exam   [facility] [name], NP   3 months ago Jaw pain   [facility] McElwee, Lauren A, NP   5 months ago Hypertension associated with diabetes ([place])   [facility] [name], NP   7 months ago Hypertension associated with diabetes [facility])   [facility] [name], NP   9 months ago Type 2 diabetes mellitus with morbid obesity ([place])   [place], Scheryl Darter, NP      Future Appointments            In 4 days [name], NP [facility], [place]

## 2022-01-08 ENCOUNTER — Ambulatory Visit (INDEPENDENT_AMBULATORY_CARE_PROVIDER_SITE_OTHER): Payer: BC Managed Care – PPO | Admitting: Nurse Practitioner

## 2022-01-08 ENCOUNTER — Other Ambulatory Visit: Payer: Self-pay

## 2022-01-08 ENCOUNTER — Encounter: Payer: Self-pay | Admitting: Nurse Practitioner

## 2022-01-08 VITALS — BP 131/84 | HR 78 | Temp 99.1°F | Wt 231.4 lb

## 2022-01-08 DIAGNOSIS — F339 Major depressive disorder, recurrent, unspecified: Secondary | ICD-10-CM

## 2022-01-08 DIAGNOSIS — J432 Centrilobular emphysema: Secondary | ICD-10-CM

## 2022-01-08 DIAGNOSIS — E1169 Type 2 diabetes mellitus with other specified complication: Secondary | ICD-10-CM | POA: Diagnosis not present

## 2022-01-08 DIAGNOSIS — M25531 Pain in right wrist: Secondary | ICD-10-CM

## 2022-01-08 DIAGNOSIS — R300 Dysuria: Secondary | ICD-10-CM

## 2022-01-08 DIAGNOSIS — I700 Atherosclerosis of aorta: Secondary | ICD-10-CM | POA: Diagnosis not present

## 2022-01-08 DIAGNOSIS — E1159 Type 2 diabetes mellitus with other circulatory complications: Secondary | ICD-10-CM | POA: Diagnosis not present

## 2022-01-08 DIAGNOSIS — N3000 Acute cystitis without hematuria: Secondary | ICD-10-CM

## 2022-01-08 DIAGNOSIS — I152 Hypertension secondary to endocrine disorders: Secondary | ICD-10-CM

## 2022-01-08 DIAGNOSIS — E785 Hyperlipidemia, unspecified: Secondary | ICD-10-CM

## 2022-01-08 MED ORDER — NITROFURANTOIN MONOHYD MACRO 100 MG PO CAPS: 100.0000 mg | ORAL_CAPSULE | Freq: Two times a day (BID) | ORAL | 0 refills | Status: DC

## 2022-01-08 MED ORDER — METFORMIN HCL 1000 MG PO TABS: 1000.0000 mg | ORAL_TABLET | Freq: Two times a day (BID) | ORAL | 1 refills | Status: DC

## 2022-01-08 NOTE — Assessment & Plan Note (Signed)
Chronic.  Controlled.  Continue with current medication regimen on Zetia 10mg .  Return to clinic in 3 months for reevaluation.  Call sooner if concerns arise.

## 2022-01-08 NOTE — Assessment & Plan Note (Signed)
Chronic.  Controlled without medication at this time.  Return to clinic in 6 months for reevaluation.  Call sooner if concerns arise.   

## 2022-01-08 NOTE — Assessment & Plan Note (Signed)
Chronic. Not well controlled at last visit.  Will repeat labs at visit today. Will make recommendations based on lab results.  Follow up in 3 months for reevaluation.

## 2022-01-08 NOTE — Assessment & Plan Note (Signed)
Chronic.  Controlled.  Continue with current medication regimen.  Uses Albuterol PRN, once every other day.  Labs ordered today.  Return to clinic in 3 months for reevaluation.  Call sooner if concerns arise.

## 2022-01-08 NOTE — Assessment & Plan Note (Signed)
Chronic.  Controlled.  Continue with current medication regimen of Amlodipine 5mg , Lisinoprol 40mg , and HCTZ 25mg .  Labs ordered today.  Return to clinic in 3 months for reevaluation.  Call sooner if concerns arise.

## 2022-01-08 NOTE — Progress Notes (Signed)
BP 131/84    Pulse 78    Temp 99.1 F (37.3 C) (Oral)    Wt 231 lb 6.4 oz (105 kg)    SpO2 96%    BMI 34.17 kg/m    Subjective:    Patient ID: Meredith Butler, female    DOB: 23-Apr-1956, 66 y.o.   MRN: [id number]  HPI: Meredith Butler is a 66 y.o. female  Chief Complaint  Patient presents with   Diabetes   Hyperlipidemia   Hypertension   Referral    Pt states she would like to go to an Network engineer in [place], Dr. Calvert Cantor for her wrist.   [address]  [place] [address] [phone]   HYPERTENSION Hypertension status: controlled  Satisfied with current treatment? yes Duration of hypertension: years BP monitoring frequency:  daily BP range: 130/86 BP medication side effects:  no Medication compliance: excellent compliance Previous BP meds:amlodipine, lisinopril, and lisinopril-HCTZ Aspirin: yes Recurrent headaches: yes Visual changes: no Palpitations: no Dyspnea: yes Chest pain: yes Lower extremity edema: no Dizzy/lightheaded: no  DIABETES Hypoglycemic episodes:no Polydipsia/polyuria: no Visual disturbance: no Chest pain: no Paresthesias: no Glucose Monitoring: yes  Accucheck frequency: Daily  Fasting glucose: 164  Post prandial:  Evening:  Before meals: Taking Insulin?: no  Long acting insulin:  Short acting insulin: Blood Pressure Monitoring: daily Retinal Examination: Up to Date Foot Exam: Up to Date Diabetic Education: Not Completed Pneumovax: Not up to Date Influenza: Not up to Date Aspirin: no  COPD COPD status: controlled Satisfied with current treatment?: yes Oxygen use: no Dyspnea frequency: once daily or 1x every two days Cough frequency: sometimes Rescue inhaler frequency:  once every other day Limitation of activity: yes Productive cough: Sometimes  Last Spirometry:  Pneumovax: Not up to Date Influenza: Not up to Date  URINARY SYMPTOMS Dysuria: yes Urinary frequency: yes Urgency: no Small volume voids: yes Symptom severity:  yes Urinary incontinence: no Foul odor: yes Hematuria: no Abdominal pain: no Back pain: yes Suprapubic pain/pressure: yes Flank pain: yes Fever:  no Vomiting: no Relief with cranberry juice: no Relief with pyridium: no Status: worse Previous urinary tract infection: no Recurrent urinary tract infection: no Treatments attempted:  AZO      Relevant past medical, surgical, family and social history reviewed and updated as indicated. Interim medical history since our last visit reviewed. Allergies and medications reviewed and updated.  Review of Systems  Per HPI unless specifically indicated above     Objective:    BP 131/84    Pulse 78    Temp 99.1 F (37.3 C) (Oral)    Wt 231 lb 6.4 oz (105 kg)    SpO2 96%    BMI 34.17 kg/m   Wt Readings from Last 3 Encounters:  01/08/22 231 lb 6.4 oz (105 kg)  10/17/21 235 lb 9.6 oz (106.9 kg)  09/11/21 235 lb 9.6 oz (106.9 kg)    Physical Exam Vitals and nursing note reviewed.  Constitutional:      General: She is not in acute distress.    Appearance: Normal appearance. She is normal weight. She is not ill-appearing, toxic-appearing or diaphoretic.  HENT:     Head: Normocephalic.     Right Ear: External ear normal.     Left Ear: External ear normal.     Nose: Nose normal.     Mouth/Throat:     Mouth: Mucous membranes are moist.     Pharynx: Oropharynx is clear.  Eyes:     General:  Right eye: No discharge.        Left eye: No discharge.     Extraocular Movements: Extraocular movements intact.     Conjunctiva/sclera: Conjunctivae normal.     Pupils: Pupils are equal, round, and reactive to light.  Cardiovascular:     Rate and Rhythm: Normal rate and regular rhythm.     Heart sounds: No murmur heard. Pulmonary:     Effort: Pulmonary effort is normal. No respiratory distress.     Breath sounds: Normal breath sounds. No wheezing or rales.  Abdominal:     General: Abdomen is flat. Bowel sounds are normal. There is no  distension.     Tenderness: There is abdominal tenderness. There is left CVA tenderness. There is no right CVA tenderness or guarding.  Musculoskeletal:     Cervical back: Normal range of motion and neck supple.  Skin:    General: Skin is warm and dry.     Capillary Refill: Capillary refill takes less than 2 seconds.  Neurological:     General: No focal deficit present.     Mental Status: She is alert and oriented to person, place, and time. Mental status is at baseline.  Psychiatric:        Mood and Affect: Mood normal.        Behavior: Behavior normal.        Thought Content: Thought content normal.        Judgment: Judgment normal.    Results for orders placed or performed in visit on 10/17/21  Comp Met (CMET)  Result Value Ref Range   Glucose 172 (H) 70 - 99 mg/dL   BUN 8 8 - 27 mg/dL   Creatinine, Ser 0.75 0.57 - 1.00 mg/dL   eGFR 88 >59 mL/min/1.73   BUN/Creatinine Ratio 11 (L) 12 - 28   Sodium 140 134 - 144 mmol/L   Potassium 4.1 3.5 - 5.2 mmol/L   Chloride 100 96 - 106 mmol/L   CO2 25 20 - 29 mmol/L   Calcium 9.9 8.7 - 10.3 mg/dL   Total Protein 6.8 6.0 - 8.5 g/dL   Albumin 4.4 3.8 - 4.8 g/dL   Globulin, Total 2.4 1.5 - 4.5 g/dL   Albumin/Globulin Ratio 1.8 1.2 - 2.2   Bilirubin Total 0.7 0.0 - 1.2 mg/dL   Alkaline Phosphatase 61 44 - 121 IU/L   AST 27 0 - 40 IU/L   ALT 35 (H) 0 - 32 IU/L  HgB A1c  Result Value Ref Range   Hgb A1c MFr Bld 8.6 (H) 4.8 - 5.6 %   Est. average glucose Bld gHb Est-mCnc 200 mg/dL  Lipid Profile  Result Value Ref Range   Cholesterol, Total 161 100 - 199 mg/dL   Triglycerides 228 (H) 0 - 149 mg/dL   HDL 39 (L) >39 mg/dL   VLDL Cholesterol Cal 38 5 - 40 mg/dL   LDL Chol Calc (NIH) 84 0 - 99 mg/dL   Chol/HDL Ratio 4.1 0.0 - 4.4 ratio      Assessment & Plan:   Problem List Items Addressed This Visit       Cardiovascular and Mediastinum   Aortic atherosclerosis (HCC)    Chronic.  Controlled.  Continue with current medication  regimen of Zetia 66m. No Labs today. Return to clinic in 3 months for reevaluation.  Call sooner if concerns arise.        [place] associated with [place]    Chronic.  Controlled.  Continue with current medication regimen of Amlodipine 18m, Lisinoprol 447m and HCTZ 2537m Labs ordered today.  Return to clinic in 3 months for reevaluation.  Call sooner if concerns arise.        Relevant Medications   metFORMIN (GLUCOPHAGE) 1000 MG tablet   Other Relevant Orders   Comp Met (CMET)     Respiratory   Centrilobular emphysema (HCC)    Chronic.  Controlled.  Continue with current medication regimen.  Uses Albuterol PRN, once every other day.  Labs ordered today.  Return to clinic in 3 months for reevaluation.  Call sooner if concerns arise.          Endocrine   Type 2 diabetes mellitus with morbid obesity (HCC)    Chronic. Not well controlled at last visit.  Will repeat labs at visit today. Will make recommendations based on lab results.  Follow up in 3 months for reevaluation.      Relevant Medications   metFORMIN (GLUCOPHAGE) 1000 MG tablet   Other Relevant Orders   HgB A1c   Hyperlipidemia associated with type 2 diabetes mellitus (HCC)    Chronic.  Controlled.  Continue with current medication regimen on Zetia 35m8mReturn to clinic in 3 months for reevaluation.  Call sooner if concerns arise.        Relevant Medications   metFORMIN (GLUCOPHAGE) 1000 MG tablet     Other   Depression, recurrent (HCC)    Chronic.  Controlled without medication at this time.  Return to clinic in 6 months for reevaluation.  Call sooner if concerns arise.        Other Visit Diagnoses     Right wrist pain       [place]   Ambulatory referral to Orthopedics   Dysuria       Will check UA in office and treat accordingly.   Relevant Orders   Urinalysis, Routine w reflex microscopic   Acute  cystitis without hematuria       UA in office did not show WBC. Will treat with Nitrofurantoin and send for culture. FU if symptoms do not improve.   Relevant Orders   Urine Culture        Follow up plan: Return in about 3 months (around 04/07/2022) for HTN, HLD, DM2 FU.

## 2022-01-08 NOTE — Assessment & Plan Note (Signed)
Chronic.  Controlled.  Continue with current medication regimen of Zetia 10mg . No Labs today. Return to clinic in 3 months for reevaluation.  Call sooner if concerns arise.

## 2022-01-08 NOTE — Progress Notes (Signed)
Results discussed with patient during visit. Treated with macrobid and sent for culture.

## 2022-01-08 NOTE — Patient Instructions (Signed)
Please call to schedule your mammogram and/or bone density: °[facility]  °Address: [address], [place], [place] [address]  °Phone: [phone] ° °

## 2022-01-09 LAB — URINALYSIS, ROUTINE W REFLEX MICROSCOPIC
Bilirubin, UA: NEGATIVE
Glucose, UA: NEGATIVE
Ketones, UA: NEGATIVE
Leukocytes,UA: NEGATIVE
Nitrite, UA: NEGATIVE
RBC, UA: NEGATIVE
Specific Gravity, UA: >1.03 — ABNORMAL HIGH (ref 1.005–1.030)
Urobilinogen, Ur: 0.2 mg/dL (ref 0.2–1.0)
pH, UA: 5.5 (ref 5.0–7.5)

## 2022-01-09 LAB — COMPREHENSIVE METABOLIC PANEL
ALT: 30 IU/L (ref 0–32)
AST: 23 IU/L (ref 0–40)
Albumin/Globulin Ratio: 2 (ref 1.2–2.2)
Albumin: 4.8 g/dL (ref 3.8–4.8)
Alkaline Phosphatase: 64 IU/L (ref 44–121)
BUN/Creatinine Ratio: 20 (ref 12–28)
BUN: 12 mg/dL (ref 8–27)
Bilirubin Total: 0.7 mg/dL (ref 0.0–1.2)
CO2: 26 mmol/L (ref 20–29)
Calcium: 10 mg/dL (ref 8.7–10.3)
Chloride: 98 mmol/L (ref 96–106)
Creatinine, Ser: 0.6 mg/dL (ref 0.57–1.00)
Globulin, Total: 2.4 g/dL (ref 1.5–4.5)
Glucose: 130 mg/dL — ABNORMAL HIGH (ref 70–99)
Potassium: 4.3 mmol/L (ref 3.5–5.2)
Sodium: 139 mmol/L (ref 134–144)
Total Protein: 7.2 g/dL (ref 6.0–8.5)
eGFR: 100 mL/min/{1.73_m2} (ref >59)

## 2022-01-09 LAB — MICROSCOPIC EXAMINATION: RBC, Urine: NONE SEEN /hpf (ref 0–2)

## 2022-01-09 LAB — HEMOGLOBIN A1C
Est. average glucose Bld gHb Est-mCnc: 177 mg/dL
Hgb A1c MFr Bld: 7.8 % — ABNORMAL HIGH (ref 4.8–5.6)

## 2022-01-10 NOTE — Progress Notes (Signed)
Please let patient know that her A1c improved from last visit to 7.8  I recommend she decrease her carbohydrate intake to help maintain this!  Please let me know if she has any questions. I still would like to see her in 3 months.

## 2022-01-11 LAB — URINE CULTURE

## 2022-01-11 MED ORDER — CIPROFLOXACIN HCL 500 MG PO TABS: 500.0000 mg | ORAL_TABLET | Freq: Two times a day (BID) | ORAL | 0 refills | Status: AC

## 2022-01-11 NOTE — Addendum Note (Signed)
Addended by: [name] on: 01/11/2022 04:23 PM   Modules accepted: Orders

## 2022-01-11 NOTE — Progress Notes (Signed)
Please let patient know that the antibiotic that was sent is not going to take care of her UTI.  I have sent Ciprofloxacin to the pharmacy to treat this.

## 2022-01-18 ENCOUNTER — Telehealth: Payer: Self-pay

## 2022-01-18 NOTE — Chronic Care Management (AMB) (Unsigned)
° ° °  Chronic Care Management Pharmacy Assistant   Name: Amberli Ruegg  MRN: [id number] DOB: September 14, 1956  Reason for Encounter: Disease State General   Recent office visits:  01/08/22-Karen Mathis Dad, NP (PCP) General follow up visit. Labs ordered. Ambulatory referral to Orthopedics. Follow up in 3 months. 10/17/22-Karen Mathis Dad, NP (PCP) Annual physical exam. Labs ordered. Mammogram ordered. Follow up in 6 months. 09/11/21-Lauren Avelina Laine, NP. Seen for jaw pain. Start on Z-pak 250 mg and prednisone 10 mg. Return if symptoms worsen or fail to improve.  Recent consult visits:  None noted  Hospital visits:  None in previous 6 months  Medications: Outpatient Encounter Medications as of 01/18/2022  Medication Sig   albuterol (VENTOLIN HFA) 108 (90 Base) MCG/ACT inhaler TAKE 2 PUFFS BY MOUTH EVERY 6 HOURS AS NEEDED FOR WHEEZE OR SHORTNESS OF BREATH   amLODipine (NORVASC) 5 MG tablet TAKE 1 TABLET (5 MG TOTAL) BY MOUTH DAILY.   aspirin 325 MG tablet Take 325 mg by mouth daily.   EPINEPHRINE 0.3 mg/0.3 mL IJ SOAJ injection INJECT 0.3 MLS (0.3 MG TOTAL) INTO THE MUSCLE AS NEEDED FOR ANAPHYLAXIS.   ezetimibe (ZETIA) 10 MG tablet TAKE 1 TABLET BY MOUTH EVERY DAY   glucose blood test strip Use as instructed   lisinopril (ZESTRIL) 20 MG tablet TAKE 1 TABLET BY MOUTH EVERY DAY   lisinopril-hydrochlorothiazide (ZESTORETIC) 20-25 MG tablet TAKE 1 TABLET BY MOUTH EVERY DAY   Magnesium 250 MG TABS Take 250 mg by mouth daily as needed (leg cramps.).    metFORMIN (GLUCOPHAGE) 1000 MG tablet Take 1 tablet (1,000 mg total) by mouth 2 (two) times daily with a meal.   nitrofurantoin, macrocrystal-monohydrate, (MACROBID) 100 MG capsule Take 1 capsule (100 mg total) by mouth 2 (two) times daily.   nitroGLYCERIN (NITROSTAT) 0.4 MG SL tablet Place 1 tablet (0.4 mg total) under the tongue every 5 (five) minutes as needed for chest pain.   VITAMIN D PO Take 1,000 Int'l Units/day by mouth.   No  facility-administered encounter medications on file as of 01/18/2022.   Have you had any problems recently with your health?   Have you had any problems with your pharmacy?   What issues or side effects are you having with your medications?   What would you like me to pass along to [name],CPP for them to help you with?    What can we do to take care of you better?   Care Gaps: COLONOSCOPY:Never done Zoster Vaccines- Shingrix:Never done OPHTHALMOLOGY EXAM:Last completed: Jul 15, 2020  Star Rating Drugs: Metformin 1000 mg Last filled: Lisinopril 20 mg Last filled: Lisinopril-hydrochlorothiazide 20-25 mg Last filled:  Corrie Mckusick, [place]

## 2022-01-31 ENCOUNTER — Other Ambulatory Visit: Payer: Self-pay

## 2022-01-31 DIAGNOSIS — Z87891 Personal history of nicotine dependence: Secondary | ICD-10-CM

## 2022-02-16 ENCOUNTER — Other Ambulatory Visit: Payer: Self-pay | Admitting: Nurse Practitioner

## 2022-02-16 NOTE — Telephone Encounter (Signed)
Requested Prescriptions  ?Pending Prescriptions Disp Refills  ?? lisinopril (ZESTRIL) 20 MG tablet [Pharmacy Med Name: LISINOPRIL 20 MG TABLET] 90 tablet 0  ?  Sig: TAKE 1 TABLET BY MOUTH EVERY DAY  ?  ? Cardiovascular:  ACE Inhibitors Passed - 02/16/2022 10:57 AM  ?  ?  Passed - Cr in normal range and within 180 days  ?  Creatinine, Ser  ?Date Value Ref Range Status  ?01/08/2022 0.60 0.57 - 1.00 mg/dL Final  ?   ?  ?  Passed - K in normal range and within 180 days  ?  Potassium  ?Date Value Ref Range Status  ?01/08/2022 4.3 3.5 - 5.2 mmol/L Final  ?   ?  ?  Passed - Patient is not pregnant  ?  ?  Passed - Last BP in normal range  ?  BP Readings from Last 1 Encounters:  ?01/08/22 131/84  ?   ?  ?  Passed - Valid encounter within last 6 months  ?  Recent Outpatient Visits   ?      ? 1 month ago Hypertension associated with diabetes ([place])  ? [place], NP  ? 4 months ago Annual physical exam  ? [place], NP  ? 5 months ago Jaw pain  ? [place], NP  ? 7 months ago Hypertension associated with diabetes ([place])  ? [place], NP  ? 8 months ago Hypertension associated with diabetes [facility])  ? [facility] [name], NP  ?  ?  ?Future Appointments   ?        ? In 2 months [name], NP [facility], [facility]  ?  ? ?  ?  ?  ? ?

## 2022-02-27 ENCOUNTER — Other Ambulatory Visit: Payer: Self-pay

## 2022-02-27 ENCOUNTER — Ambulatory Visit
Admission: RE | Admit: 2022-02-27 | Discharge: 2022-02-27 | Disposition: A | Discharge status: Home / Self Care | Payer: BC Managed Care – PPO | Source: Ambulatory Visit | Attending: Acute Care | Admitting: Acute Care

## 2022-02-27 DIAGNOSIS — Z87891 Personal history of nicotine dependence: Secondary | ICD-10-CM | POA: Insufficient documentation

## 2022-03-06 ENCOUNTER — Telehealth: Payer: Self-pay | Admitting: Nurse Practitioner

## 2022-03-06 NOTE — Telephone Encounter (Signed)
Per [name], NP recommends patient reach out to the [place] office as they were the one who order the CT of Lung. Patient verbalized understanding and has no further questions.  ?

## 2022-03-06 NOTE — Telephone Encounter (Signed)
Copied from [place] [phone]. Topic: General - Inquiry ?>> Mar 06, 2022 10:45 AM McGill, Meredith Butler wrote: ?Reason for CRM: Pt is requesting a call back for results on ct chest lung screening ? ?Please advise. ?

## 2022-03-07 ENCOUNTER — Telehealth: Payer: Self-pay | Admitting: Acute Care

## 2022-03-07 DIAGNOSIS — N2889 Other specified disorders of kidney and ureter: Secondary | ICD-10-CM

## 2022-03-07 NOTE — Telephone Encounter (Signed)
I have attempted to call the patient with the results of her low dose Ct Chest. There was no answer. I have left a HIPPA compliant message on her phone with the office contact information asking that she call the office for her results. We will await her return call.  ?

## 2022-03-08 ENCOUNTER — Ambulatory Visit
Admission: RE | Admit: 2022-03-08 | Discharge: 2022-03-08 | Disposition: A | Discharge status: Home / Self Care | Payer: BC Managed Care – PPO | Source: Ambulatory Visit | Attending: Nurse Practitioner | Admitting: Nurse Practitioner

## 2022-03-08 ENCOUNTER — Other Ambulatory Visit: Payer: Self-pay

## 2022-03-08 ENCOUNTER — Telehealth: Payer: Self-pay

## 2022-03-08 DIAGNOSIS — Z122 Encounter for screening for malignant neoplasm of respiratory organs: Secondary | ICD-10-CM

## 2022-03-08 DIAGNOSIS — Z1231 Encounter for screening mammogram for malignant neoplasm of breast: Secondary | ICD-10-CM | POA: Diagnosis present

## 2022-03-08 DIAGNOSIS — Z87891 Personal history of nicotine dependence: Secondary | ICD-10-CM

## 2022-03-08 NOTE — Telephone Encounter (Signed)
Order placed for annual LDCT for 2024 ?

## 2022-03-08 NOTE — Telephone Encounter (Signed)
Patient has been made aware of the Lung Screening results. ? ?Please make her aware of the lesion on her left kidney.  I have already placed the order for the ultra sound and someone should be reaching out to get that scheduled.  ?

## 2022-03-08 NOTE — Telephone Encounter (Signed)
Please call the patient and maker her aware that her lung CT showed that she has lung nodules that are benign in appearance and behavior with a very low likelihood of becoming a clinically active cancer due to size or lack of growth. ? ?The scan did show an abnormality on the kidney.  The radiologist recommended a renal US.  I have ordered this.   ? ?The NP who takes care of the CT lung scans have been trying to get in touch with her.  If she has further questions, please call her.  ?

## 2022-03-08 NOTE — Telephone Encounter (Signed)
I have spoken with Ms. Rayon.I explained that from a lung cancer perspective her scan was read as a LR 2, nodules that are not concerning, and recommendation is for a 12 month annual screening scan. ?I explained that there were 2 incidental findings on the scan. There was a notation of an indeterminate lesion on the upper left pole of her kidney , radiology recommends a renal ultrasound to better evaluate. I  told her I have messaged [name] NP to follow up with her.  ?I also let her know there were notations of CAD and hepatic steatosis on the scan. She was aware of both from previous scan. ? ?[name], please follow up with patient regarding the renal US if you are in agreement with this plan. Thanks so much. ? ?[name], please order a 12 month follow up scan.  ?Thanks so much ?

## 2022-03-08 NOTE — Telephone Encounter (Signed)
Called and LVM asking for patient to please return my call.  ? ?OK for [facility] to give message to patient if she calls back.  ?

## 2022-03-08 NOTE — Telephone Encounter (Signed)
Received return call from pt.Meredith Butler what Matt information should be conveyed to pt. Marland Kitchen ?

## 2022-03-08 NOTE — Telephone Encounter (Signed)
I have attempted to call the patient with th results of her low dose CT Chest. Again, there was no answer. I have left another HIPPA compliant message on her VM requesting that she call the office for her results, I left our contact information on her VM. We will continue to attempt to call her. ? ?[name], We are having a hard time getting in touch with Ms. Meredith Butler. Her scan was read as a Lung RADS 2: nodules that are benign in appearance and behavior with a very low likelihood of becoming a clinically active cancer due to size or lack of growth. Recommendation per radiology is for a repeat LDCT in 12 months. So from a lung cancer perspective, no concerns and annual follow up which we will arrange.  ?I wanted to make sure you were aware of the partially imaged indeterminate lesion of the upper pole the left kidney . Radiology are recommending a renal ultrasound to take a better look. I will continue to try to get in touch with her and go over this. I will refer her back to you for the follow up of the renal finding. Please let me know if you have any questions or concerns. Thanks ? ?[name], patient's PCP is on this message, so she will know results. Please place order for annual screening scan 02/2023. If she does not return our call by Friday, we will need to send a letter. Thanks so much ?

## 2022-03-08 NOTE — Progress Notes (Signed)
Please let patient know her Mammogram did not show any evidence of a malignancy.  The recommendation is to repeat the Mammogram in 1 year.  

## 2022-03-13 ENCOUNTER — Telehealth: Payer: Self-pay | Admitting: Nurse Practitioner

## 2022-03-13 DIAGNOSIS — R9389 Abnormal findings on diagnostic imaging of other specified body structures: Secondary | ICD-10-CM

## 2022-03-13 NOTE — Telephone Encounter (Signed)
Scan reordered.  ?

## 2022-03-21 ENCOUNTER — Ambulatory Visit: Payer: Medicare Other

## 2022-03-22 ENCOUNTER — Telehealth: Payer: Self-pay

## 2022-03-22 NOTE — Chronic Care Management (AMB) (Signed)
    Chronic Care Management Pharmacy Assistant   Name: Meredith Butler  MRN: [id number] DOB: [birth year]  Reason for Encounter: Disease State General   Recent office visits:  01/08/22-Karen Mathis Dad, NP (PCP) General follow up visit. Labs ordered. Ambulatory referral to Orthopedics. Follow up in 3 months. 10/17/21-Karen Mathis Dad, NP (PCP) Annual exam visit. Labs ordered. Mammogram ordered. Follow up in 6 months.   Recent consult visits:  None  noted  Hospital visits:  None in previous 6 months  Medications: Outpatient Encounter Medications as of 03/22/2022  Medication Sig   albuterol (VENTOLIN HFA) 108 (90 Base) MCG/ACT inhaler TAKE 2 PUFFS BY MOUTH EVERY 6 HOURS AS NEEDED FOR WHEEZE OR SHORTNESS OF BREATH   amLODipine (NORVASC) 5 MG tablet TAKE 1 TABLET (5 MG TOTAL) BY MOUTH DAILY.   aspirin 325 MG tablet Take 325 mg by mouth daily.   EPINEPHRINE 0.3 mg/0.3 mL IJ SOAJ injection INJECT 0.3 MLS (0.3 MG TOTAL) INTO THE MUSCLE AS NEEDED FOR ANAPHYLAXIS.   ezetimibe (ZETIA) 10 MG tablet TAKE 1 TABLET BY MOUTH EVERY DAY   glucose blood test strip Use as instructed   lisinopril (ZESTRIL) 20 MG tablet TAKE 1 TABLET BY MOUTH EVERY DAY   lisinopril-hydrochlorothiazide (ZESTORETIC) 20-25 MG tablet TAKE 1 TABLET BY MOUTH EVERY DAY   Magnesium 250 MG TABS Take 250 mg by mouth daily as needed (leg cramps.).    metFORMIN (GLUCOPHAGE) 1000 MG tablet Take 1 tablet (1,000 mg total) by mouth 2 (two) times daily with a meal.   nitrofurantoin, macrocrystal-monohydrate, (MACROBID) 100 MG capsule Take 1 capsule (100 mg total) by mouth 2 (two) times daily.   nitroGLYCERIN (NITROSTAT) 0.4 MG SL tablet Place 1 tablet (0.4 mg total) under the tongue every 5 (five) minutes as needed for chest pain.   VITAMIN D PO Take 1,000 Int'l Units/day by mouth.   No facility-administered encounter medications on file as of 03/22/2022.   [place] for General Review Call   Chart Review:  Have there been any  documented [place], changed, or discontinued medications since last visit? No (If yes, include name, dose, frequency, date) Has there been any documented recent hospitalizations or ED visits since last visit with Clinical Pharmacist? No   Unsuccessful attempts to complete assessment call. I have called patient 3x and left 3 voicemail's for the patient to return my call when available.    Care Gaps: COLONOSCOPY:Never done Zoster Vaccines- Shingrix:Never done OPHTHALMOLOGY EXAM:Last completed: Jul 15, 2020  Star Rating Drugs: Lisinopril 20 mg Last filled:02/16/22 90 DS Metformin 1000 mg Last filled:02/03/22 90 DS Lisinopril-hydrochlorothiazide 20-25 mg Last filled:02/15/22 90 DS   Myriam Elta [place], [place]

## 2022-04-16 ENCOUNTER — Ambulatory Visit: Payer: Medicare Other | Admitting: Nurse Practitioner

## 2022-04-19 ENCOUNTER — Ambulatory Visit: Payer: Medicare Other | Admitting: Nurse Practitioner

## 2022-04-23 ENCOUNTER — Ambulatory Visit: Payer: Self-pay

## 2022-04-23 NOTE — Telephone Encounter (Signed)
   Chief Complaint: Right side of face swollen from ear to jaw. "I had this before and it was my tooth. I nned an antibiotic." Symptoms: Swelling, pain Frequency: Thursday Pertinent Negatives: Patient denies fever Disposition: '[]'$ ED /'[]'$ Urgent Care (no appt availability in office) / '[]'$ Appointment(In office/virtual)/ '[]'$  [place] [facility]/ '[]'$ Home Care/ '[]'$ Refused Recommended Disposition /'[]'$ [place] [facility]/ '[x]'$  Follow-up with PCP Additional Notes: Declines visit, wants antibiotic called to pharmacy. Will try to find a dentist.   Answer Assessment - Initial Assessment Questions 1. ONSET: "When did the swelling start?" (e.g., minutes, hours, days)     Last Thursday 2. LOCATION: "What part of the face is swollen?"     Right ear down to jaw 3. SEVERITY: "How swollen is it?"     Moderate 4. ITCHING: "Is there any itching?" If Yes, ask: "How much?"   (Scale 1-10; mild, moderate or severe)     No 5. PAIN: "Is the swelling painful to touch?" If Yes, ask: "How painful is it?"   (Scale 1-10; mild, moderate or severe)   - NONE (0): no pain   - MILD (1-3): doesn't interfere with normal activities    - MODERATE (4-7): interferes with normal activities or awakens from sleep    - SEVERE (8-10): excruciating pain, unable to do any normal activities      Ear - 6-7 6. FEVER: "Do you have a fever?" If Yes, ask: "What is it, how was it measured, and when did it start?"      No 7. CAUSE: "What do you think is causing the face swelling?"     Unsure 8. RECURRENT SYMPTOM: "Have you had face swelling before?" If Yes, ask: "When was the last time?" "What happened that time?"     Yes 9. OTHER SYMPTOMS: "Do you have any other symptoms?" (e.g., toothache, leg swelling)     No 10. PREGNANCY: "Is there any chance you are pregnant?" "When was your last menstrual period?"       No  Protocols used: Face Swelling-A-AH

## 2022-04-23 NOTE — Telephone Encounter (Signed)
Spoke with patient, states she has an appt with a dentist today. No further questions at this time.

## 2022-04-24 ENCOUNTER — Ambulatory Visit
Admission: RE | Admit: 2022-04-24 | Discharge: 2022-04-24 | Disposition: A | Discharge status: Home / Self Care | Payer: BC Managed Care – PPO | Source: Ambulatory Visit | Attending: Nurse Practitioner | Admitting: Nurse Practitioner

## 2022-04-24 DIAGNOSIS — R9389 Abnormal findings on diagnostic imaging of other specified body structures: Secondary | ICD-10-CM | POA: Diagnosis present

## 2022-04-26 ENCOUNTER — Other Ambulatory Visit: Payer: Self-pay | Admitting: Nurse Practitioner

## 2022-04-26 DIAGNOSIS — N2889 Other specified disorders of kidney and ureter: Secondary | ICD-10-CM

## 2022-04-26 NOTE — Progress Notes (Signed)
Please let patient know that her imaging shows that she has a mass on on the left kidney.  It is recommended that we get an MRI to look further at the mass.  I have gone ahead and ordered it to get it scheduled.

## 2022-04-29 ENCOUNTER — Other Ambulatory Visit: Payer: Self-pay | Admitting: Nurse Practitioner

## 2022-04-30 ENCOUNTER — Other Ambulatory Visit: Payer: Self-pay | Admitting: Nurse Practitioner

## 2022-04-30 ENCOUNTER — Telehealth: Payer: Self-pay | Admitting: Nurse Practitioner

## 2022-04-30 NOTE — Telephone Encounter (Signed)
Requested Prescriptions  Pending Prescriptions Disp Refills  . ezetimibe (ZETIA) 10 MG tablet [Pharmacy Med Name: EZETIMIBE 10 MG TABLET] 90 tablet 1    Sig: TAKE 1 TABLET BY MOUTH EVERY DAY     Cardiovascular:  Antilipid - Sterol Transport Inhibitors Failed - 04/29/2022  8:56 AM      Failed - Lipid Panel in normal range within the last 12 months    Cholesterol, Total  Date Value Ref Range Status  10/17/2021 161 100 - 199 mg/dL Final   LDL Chol Calc (NIH)  Date Value Ref Range Status  10/17/2021 84 0 - 99 mg/dL Final   HDL  Date Value Ref Range Status  10/17/2021 39 (L) >39 mg/dL Final   Triglycerides  Date Value Ref Range Status  10/17/2021 228 (H) 0 - 149 mg/dL Final         Passed - AST in normal range and within 360 days    AST  Date Value Ref Range Status  01/08/2022 23 0 - 40 IU/L Final         Passed - ALT in normal range and within 360 days    ALT  Date Value Ref Range Status  01/08/2022 30 0 - 32 IU/L Final         Passed - Patient is not pregnant      Passed - Valid encounter within last 12 months    Recent Outpatient Visits          3 months ago Hypertension associated with diabetes ([place])   [place], Karen, NP   6 months ago Annual physical exam   [facility] [name], NP   7 months ago Jaw pain   [facility] [place], Lauren A, NP   9 months ago Hypertension associated with diabetes [facility])   [facility] [name], NP   11 months ago Hypertension associated with diabetes [facility])   [place], Karen, NP      Future Appointments            In 2 days [name], NP [facility], [place]

## 2022-04-30 NOTE — Telephone Encounter (Signed)
Copied from [place] [id number]. Topic: General - Other >> Apr 30, 2022  4:18 PM Everette C wrote: Reason for CRM: The patient has called to follow up on a previously submitted request for MRI orders   Please contact further when possible

## 2022-05-01 NOTE — Telephone Encounter (Signed)
Requested medication (s) are due for refill today: Yes  Requested medication (s) are on the active medication list: Yes  Last refill:  01/31/18  Future visit scheduled: Yes  Notes to clinic:  Prescription has expired.    Requested Prescriptions  Pending Prescriptions Disp Refills   ONETOUCH VERIO test strip [Pharmacy Med Name: [place] TEST STRIP] 100 strip 12    Sig: USE TO [place]     Endocrinology: Diabetes - Testing Supplies Passed - 04/30/2022  4:23 PM      Passed - Valid encounter within last 12 months    Recent Outpatient Visits           3 months ago Hypertension associated with diabetes ([place])   [facility] [name], NP   6 months ago Annual physical exam   [facility] [name], NP   7 months ago Jaw pain   [facility] [place], Lauren A, NP   9 months ago Hypertension associated with diabetes ([place])   [facility] [name], NP   11 months ago Hypertension associated with diabetes ([place])   [facility] [name], NP       Future Appointments             Tomorrow [name], NP [facility], [facility]            Signed Prescriptions Disp Refills   lisinopril-hydrochlorothiazide (ZESTORETIC) 20-25 MG tablet 90 tablet 0    Sig: TAKE 1 TABLET BY MOUTH EVERY DAY     Cardiovascular:  ACEI + Diuretic Combos Passed - 04/30/2022  4:23 PM      Passed - Na in normal range and within 180 days    Sodium  Date Value Ref Range Status  01/08/2022 139 134 - 144 mmol/L Final         Passed - K in normal range and within 180 days    Potassium  Date Value Ref Range Status  01/08/2022 4.3 3.5 - 5.2 mmol/L Final         Passed - Cr in normal range and within 180 days    Creatinine, Ser  Date Value Ref Range Status  01/08/2022 0.60 0.57 - 1.00 mg/dL Final         Passed - eGFR is 30 or above and within 180 days    GFR calc Af Amer   Date Value Ref Range Status  12/13/2020 80 >59 mL/min/1.73 Final    Comment:    **In accordance with recommendations from the NKF-ASN Task force,**   [facility] is in the process of updating its eGFR calculation to the   2021 CKD-EPI creatinine equation that estimates kidney function   without a race variable.    GFR calc non Af Amer  Date Value Ref Range Status  12/13/2020 70 >59 mL/min/1.73 Final   eGFR  Date Value Ref Range Status  01/08/2022 100 >59 mL/min/1.73 Final         Passed - Patient is not pregnant      Passed - Last BP in normal range    BP Readings from Last 1 Encounters:  01/08/22 131/84         Passed - Valid encounter within last 6 months    Recent Outpatient Visits           3 months ago Hypertension associated with diabetes [facility])   [place], NP   6 months  ago Annual physical exam   [place], NP   7 months ago Jaw pain   [facility] [place], Lauren A, NP   9 months ago Hypertension associated with diabetes [facility])   [facility] [name], NP   11 months ago Hypertension associated with diabetes [facility])   [facility] [name], NP       Future Appointments             Tomorrow [name], NP [facility], [place]

## 2022-05-01 NOTE — Progress Notes (Unsigned)
There were no vitals taken for this visit.   Subjective:    Patient ID: Meredith Butler, female    DOB: Jun 09, 1956, 66 y.o.   MRN: [id number]  HPI: Meredith Butler is a 66 y.o. female  No chief complaint on file.  HYPERTENSION Hypertension status: controlled  Satisfied with current treatment? yes Duration of hypertension: years BP monitoring frequency:  daily BP range: 130/86 BP medication side effects:  no Medication compliance: excellent compliance Previous BP meds:amlodipine, lisinopril, and lisinopril-HCTZ Aspirin: yes Recurrent headaches: yes Visual changes: no Palpitations: no Dyspnea: yes Chest pain: yes Lower extremity edema: no Dizzy/lightheaded: no  DIABETES Hypoglycemic episodes:no Polydipsia/polyuria: no Visual disturbance: no Chest pain: no Paresthesias: no Glucose Monitoring: yes  Accucheck frequency: Daily  Fasting glucose: 164  Post prandial:  Evening:  Before meals: Taking Insulin?: no  Long acting insulin:  Short acting insulin: Blood Pressure Monitoring: daily Retinal Examination: Up to Date Foot Exam: Up to Date Diabetic Education: Not Completed Pneumovax: Not up to Date Influenza: Not up to Date Aspirin: no  COPD COPD status: controlled Satisfied with current treatment?: yes Oxygen use: no Dyspnea frequency: once daily or 1x every two days Cough frequency: sometimes Rescue inhaler frequency:  once every other day Limitation of activity: yes Productive cough: Sometimes  Last Spirometry:  Pneumovax: Not up to Date Influenza: Not up to Date  URINARY SYMPTOMS Dysuria: yes Urinary frequency: yes Urgency: no Small volume voids: yes Symptom severity: yes Urinary incontinence: no Foul odor: yes Hematuria: no Abdominal pain: no Back pain: yes Suprapubic pain/pressure: yes Flank pain: yes Fever:  no Vomiting: no Relief with cranberry juice: no Relief with pyridium: no Status: worse Previous urinary tract infection: no Recurrent  urinary tract infection: no Treatments attempted:  AZO      Relevant past medical, surgical, family and social history reviewed and updated as indicated. Interim medical history since our last visit reviewed. Allergies and medications reviewed and updated.  Review of Systems  Per HPI unless specifically indicated above     Objective:    There were no vitals taken for this visit.  Wt Readings from Last 3 Encounters:  02/27/22 231 lb (104.8 kg)  01/08/22 231 lb 6.4 oz (105 kg)  10/17/21 235 lb 9.6 oz (106.9 kg)    Physical Exam Vitals and nursing note reviewed.  Constitutional:      General: She is not in acute distress.    Appearance: Normal appearance. She is normal weight. She is not ill-appearing, toxic-appearing or diaphoretic.  HENT:     Head: Normocephalic.     Right Ear: External ear normal.     Left Ear: External ear normal.     Nose: Nose normal.     Mouth/Throat:     Mouth: Mucous membranes are moist.     Pharynx: Oropharynx is clear.  Eyes:     General:        Right eye: No discharge.        Left eye: No discharge.     Extraocular Movements: Extraocular movements intact.     Conjunctiva/sclera: Conjunctivae normal.     Pupils: Pupils are equal, round, and reactive to light.  Cardiovascular:     Rate and Rhythm: Normal rate and regular rhythm.     Heart sounds: No murmur heard. Pulmonary:     Effort: Pulmonary effort is normal. No respiratory distress.     Breath sounds: Normal breath sounds. No wheezing or rales.  Abdominal:     General:  Abdomen is flat. Bowel sounds are normal. There is no distension.     Tenderness: There is abdominal tenderness. There is left CVA tenderness. There is no right CVA tenderness or guarding.  Musculoskeletal:     Cervical back: Normal range of motion and neck supple.  Skin:    General: Skin is warm and dry.     Capillary Refill: Capillary refill takes less than 2 seconds.  Neurological:     General: No focal deficit  present.     Mental Status: She is alert and oriented to person, place, and time. Mental status is at baseline.  Psychiatric:        Mood and Affect: Mood normal.        Behavior: Behavior normal.        Thought Content: Thought content normal.        Judgment: Judgment normal.     Results for orders placed or performed in visit on 01/08/22  Urine Culture   Specimen: Urine   UR  Result Value Ref Range   Urine Culture, Routine Final report (A)    Organism ID, Bacteria Klebsiella pneumoniae (A)    Antimicrobial Susceptibility Comment   Microscopic Examination   Urine  Result Value Ref Range   WBC, UA 0-5 0 - 5 /hpf   RBC None seen 0 - 2 /hpf   Epithelial Cells (non renal) 0-10 0 - 10 /hpf   Bacteria, UA Few (A) None seen/Few  Comp Met (CMET)  Result Value Ref Range   Glucose 130 (H) 70 - 99 mg/dL   BUN 12 8 - 27 mg/dL   Creatinine, Ser 0.60 0.57 - 1.00 mg/dL   eGFR 100 >59 mL/min/1.73   BUN/Creatinine Ratio 20 12 - 28   Sodium 139 134 - 144 mmol/L   Potassium 4.3 3.5 - 5.2 mmol/L   Chloride 98 96 - 106 mmol/L   CO2 26 20 - 29 mmol/L   Calcium 10.0 8.7 - 10.3 mg/dL   Total Protein 7.2 6.0 - 8.5 g/dL   Albumin 4.8 3.8 - 4.8 g/dL   Globulin, Total 2.4 1.5 - 4.5 g/dL   Albumin/Globulin Ratio 2.0 1.2 - 2.2   Bilirubin Total 0.7 0.0 - 1.2 mg/dL   Alkaline Phosphatase 64 44 - 121 IU/L   AST 23 0 - 40 IU/L   ALT 30 0 - 32 IU/L  HgB A1c  Result Value Ref Range   Hgb A1c MFr Bld 7.8 (H) 4.8 - 5.6 %   Est. average glucose Bld gHb Est-mCnc 177 mg/dL  Urinalysis, Routine w reflex microscopic  Result Value Ref Range   Specific Gravity, UA >1.030 (H) 1.005 - 1.030   pH, UA 5.5 5.0 - 7.5   Color, UA Yellow Yellow   Appearance Ur Clear Clear   Leukocytes,UA Negative Negative   Protein,UA 3+ (A) Negative/Trace   Glucose, UA Negative Negative   Ketones, UA Negative Negative   RBC, UA Negative Negative   Bilirubin, UA Negative Negative   Urobilinogen, Ur 0.2 0.2 - 1.0 mg/dL    Nitrite, UA Negative Negative   Microscopic Examination See below:       Assessment & Plan:   Problem List Items Addressed This Visit       Cardiovascular and Mediastinum   Aortic atherosclerosis (HCC)   Angina pectoris ([place])   Hypertension associated with diabetes ([place]) - Primary     Respiratory   Centrilobular emphysema (HCC)     Endocrine   Type  2 diabetes mellitus with morbid obesity ([place])   Hyperlipidemia associated with type 2 diabetes mellitus ([place])     Other   Depression, recurrent ([place])     Follow up plan: No follow-ups on file.

## 2022-05-02 ENCOUNTER — Encounter: Payer: Self-pay | Admitting: Nurse Practitioner

## 2022-05-02 ENCOUNTER — Ambulatory Visit (INDEPENDENT_AMBULATORY_CARE_PROVIDER_SITE_OTHER): Payer: BC Managed Care – PPO | Admitting: Nurse Practitioner

## 2022-05-02 VITALS — BP 122/85 | HR 82 | Temp 98.6°F | Wt 227.8 lb

## 2022-05-02 DIAGNOSIS — E1169 Type 2 diabetes mellitus with other specified complication: Secondary | ICD-10-CM

## 2022-05-02 DIAGNOSIS — I700 Atherosclerosis of aorta: Secondary | ICD-10-CM

## 2022-05-02 DIAGNOSIS — I152 Hypertension secondary to endocrine disorders: Secondary | ICD-10-CM

## 2022-05-02 DIAGNOSIS — J432 Centrilobular emphysema: Secondary | ICD-10-CM | POA: Diagnosis not present

## 2022-05-02 DIAGNOSIS — E1159 Type 2 diabetes mellitus with other circulatory complications: Secondary | ICD-10-CM

## 2022-05-02 DIAGNOSIS — I209 Angina pectoris, unspecified: Secondary | ICD-10-CM

## 2022-05-02 DIAGNOSIS — E785 Hyperlipidemia, unspecified: Secondary | ICD-10-CM

## 2022-05-02 DIAGNOSIS — F339 Major depressive disorder, recurrent, unspecified: Secondary | ICD-10-CM

## 2022-05-02 MED ORDER — EZETIMIBE 10 MG PO TABS: 10.0000 mg | ORAL_TABLET | Freq: Every day | ORAL | 1 refills | Status: DC

## 2022-05-02 MED ORDER — LISINOPRIL 20 MG PO TABS: 20.0000 mg | ORAL_TABLET | Freq: Every day | ORAL | 1 refills | Status: DC

## 2022-05-02 MED ORDER — LISINOPRIL-HYDROCHLOROTHIAZIDE 20-25 MG PO TABS: 1.0000 | ORAL_TABLET | Freq: Every day | ORAL | 1 refills | Status: DC

## 2022-05-02 MED ORDER — EPINEPHRINE 0.3 MG/0.3ML IJ SOAJ: 0.3000 mg | INTRAMUSCULAR | 2 refills | Status: DC | PRN

## 2022-05-02 MED ORDER — ALBUTEROL SULFATE HFA 108 (90 BASE) MCG/ACT IN AERS: INHALATION_SPRAY | RESPIRATORY_TRACT | 1 refills | Status: DC

## 2022-05-02 MED ORDER — METFORMIN HCL 1000 MG PO TABS: 1000.0000 mg | ORAL_TABLET | Freq: Two times a day (BID) | ORAL | 1 refills | Status: DC

## 2022-05-02 MED ORDER — AMLODIPINE BESYLATE 5 MG PO TABS: 5.0000 mg | ORAL_TABLET | Freq: Every day | ORAL | 1 refills | Status: DC

## 2022-05-02 NOTE — Assessment & Plan Note (Signed)
Chronic.  Controlled.  Continue with current medication regimen of Zetia 10mg.  Labs ordered today.  Return to clinic in 6 months for reevaluation.  Call sooner if concerns arise.   

## 2022-05-02 NOTE — Assessment & Plan Note (Signed)
Chronic.  Controlled.  Continue with current medication regimen of Amlodipine '5mg'$ , Lisinoprol '40mg'$ , and HCTZ '25mg'$ .  Refills sent today.  Labs ordered today.  Return to clinic in 6 months for reevaluation.  Call sooner if concerns arise.

## 2022-05-02 NOTE — Assessment & Plan Note (Signed)
Chronic.  Controlled.  Continue with current medication regimen.  Uses Albuterol PRN, once every other day.  Labs ordered today.  Refills sent today.  Return to clinic in 6 months for reevaluation.  Call sooner if concerns arise.

## 2022-05-02 NOTE — Assessment & Plan Note (Signed)
Chronic.  Controlled without medication at this time.  Return to clinic in 6 months for reevaluation.  Call sooner if concerns arise.   

## 2022-05-02 NOTE — Assessment & Plan Note (Signed)
Chronic. Controlled. Labs ordered today.  Will make recommendations based on lab results.  Follow up in 3 months for reevaluation.

## 2022-05-02 NOTE — Assessment & Plan Note (Signed)
Chronic.  Controlled.  Continue with current medication regimen on Zetia '10mg'$ .  Return to clinic in 3 months for reevaluation.  Call sooner if concerns arise.

## 2022-05-03 LAB — LIPID PANEL
Chol/HDL Ratio: 4.1 ratio (ref 0.0–4.4)
Cholesterol, Total: 164 mg/dL (ref 100–199)
HDL: 40 mg/dL (ref >39)
LDL Chol Calc (NIH): 84 mg/dL (ref 0–99)
Triglycerides: 238 mg/dL — ABNORMAL HIGH (ref 0–149)
VLDL Cholesterol Cal: 40 mg/dL (ref 5–40)

## 2022-05-03 LAB — COMPREHENSIVE METABOLIC PANEL
ALT: 40 IU/L — ABNORMAL HIGH (ref 0–32)
AST: 33 IU/L (ref 0–40)
Albumin/Globulin Ratio: 1.9 (ref 1.2–2.2)
Albumin: 4.7 g/dL (ref 3.8–4.8)
Alkaline Phosphatase: 60 IU/L (ref 44–121)
BUN/Creatinine Ratio: 17 (ref 12–28)
BUN: 15 mg/dL (ref 8–27)
Bilirubin Total: 0.7 mg/dL (ref 0.0–1.2)
CO2: 21 mmol/L (ref 20–29)
Calcium: 10.1 mg/dL (ref 8.7–10.3)
Chloride: 99 mmol/L (ref 96–106)
Creatinine, Ser: 0.89 mg/dL (ref 0.57–1.00)
Globulin, Total: 2.5 g/dL (ref 1.5–4.5)
Glucose: 120 mg/dL — ABNORMAL HIGH (ref 70–99)
Potassium: 4.7 mmol/L (ref 3.5–5.2)
Sodium: 139 mmol/L (ref 134–144)
Total Protein: 7.2 g/dL (ref 6.0–8.5)
eGFR: 71 mL/min/{1.73_m2} (ref >59)

## 2022-05-03 LAB — HEMOGLOBIN A1C
Est. average glucose Bld gHb Est-mCnc: 171 mg/dL
Hgb A1c MFr Bld: 7.6 % — ABNORMAL HIGH (ref 4.8–5.6)

## 2022-05-03 NOTE — Progress Notes (Signed)
Please let patient know that her lab work looks good.  A1c improved from 7.6 to 7.4 which is great news. Cholesterol remains stable.  Continue with current medication regimen.  No other concerns at this time.  Follow up as discussed.

## 2022-05-09 ENCOUNTER — Telehealth: Payer: Self-pay

## 2022-05-09 DIAGNOSIS — N2889 Other specified disorders of kidney and ureter: Secondary | ICD-10-CM

## 2022-05-09 NOTE — Telephone Encounter (Signed)
Spoke with [name] from [facility] to ler her know the [place] orders have been placed.

## 2022-05-09 NOTE — Telephone Encounter (Signed)
Copied from [place] [id number]. Topic: General - Other >> May 09, 2022 10:44 AM Oley Balm E wrote: Reason for CRM: MRI needs to be changed MR abdomen with and without contrast  [name] from [facility] contact: [phone] Pt is scheduled for tomorrow   Routing to providers in office. Ordered by [name], she is not in. Can one of you look into this since the patient is scheduled tomorrow please?

## 2022-05-09 NOTE — Telephone Encounter (Signed)
[facility] order placed. Unsure if this needs to be approved as it's a different order. Routing to [place] as FYI.

## 2022-05-10 ENCOUNTER — Ambulatory Visit
Admission: RE | Admit: 2022-05-10 | Discharge: 2022-05-10 | Disposition: A | Discharge status: Home / Self Care | Payer: BC Managed Care – PPO | Source: Ambulatory Visit | Attending: Nurse Practitioner | Admitting: Nurse Practitioner

## 2022-05-10 ENCOUNTER — Other Ambulatory Visit: Payer: Self-pay | Admitting: Family Medicine

## 2022-05-10 DIAGNOSIS — R9389 Abnormal findings on diagnostic imaging of other specified body structures: Secondary | ICD-10-CM

## 2022-05-10 DIAGNOSIS — N2889 Other specified disorders of kidney and ureter: Secondary | ICD-10-CM | POA: Insufficient documentation

## 2022-05-10 MED ORDER — GADOBUTROL 1 MMOL/ML IV SOLN
10.0000 mL | Freq: Once | INTRAVENOUS | Status: AC | PRN
  Administered 2022-05-10: 10 mL via INTRAVENOUS

## 2022-05-10 NOTE — Progress Notes (Signed)
Ref urology 

## 2022-05-11 ENCOUNTER — Telehealth: Payer: Self-pay

## 2022-05-15 ENCOUNTER — Encounter: Payer: Self-pay | Admitting: Urology

## 2022-05-15 ENCOUNTER — Telehealth: Payer: Self-pay | Admitting: Nurse Practitioner

## 2022-05-15 ENCOUNTER — Ambulatory Visit (INDEPENDENT_AMBULATORY_CARE_PROVIDER_SITE_OTHER): Payer: BC Managed Care – PPO | Admitting: Urology

## 2022-05-15 VITALS — BP 126/84 | HR 89 | Ht 68.0 in | Wt 228.0 lb

## 2022-05-15 DIAGNOSIS — N2889 Other specified disorders of kidney and ureter: Secondary | ICD-10-CM | POA: Diagnosis not present

## 2022-05-17 NOTE — Telephone Encounter (Signed)
Document has been faxed. 

## 2022-05-17 NOTE — Telephone Encounter (Signed)
Joy with [facility] called in to check on status of a medical clearance for a patient to get a procedure with a periodontist. Please assist further

## 2022-05-17 NOTE — Telephone Encounter (Signed)
Form filled out and ready to be faxed back.

## 2022-05-18 ENCOUNTER — Telehealth: Payer: Self-pay | Admitting: Nurse Practitioner

## 2022-05-18 NOTE — Telephone Encounter (Signed)
Copied from [place]. Topic: Medicare AWV >> May 18, 2022 11:18 AM Josephina Gip wrote: Reason for CRM:  Left message for patient to call back and schedule Medicare Annual Wellness Visit (AWV) to be done virtually or by telephone.  No hx of AWV eligible as of 04/19/22  Please schedule at anytime with [facility]Nurse Health Advisor.      46 Minutes appointment   Any questions, please call me at [phone]

## 2022-05-21 NOTE — Telephone Encounter (Signed)
Patient scheduled.

## 2022-05-21 NOTE — Telephone Encounter (Signed)
Patient returning call   Patient states she is having service issues w/ her phone and if she does not answer please leave a message w/ a time you can call back and she will go outside for service

## 2022-05-24 ENCOUNTER — Ambulatory Visit (INDEPENDENT_AMBULATORY_CARE_PROVIDER_SITE_OTHER): Payer: BC Managed Care – PPO | Admitting: *Deleted

## 2022-05-24 DIAGNOSIS — Z0000 Encounter for general adult medical examination without abnormal findings: Secondary | ICD-10-CM | POA: Diagnosis not present

## 2022-05-24 NOTE — Patient Instructions (Signed)
Meredith Butler , Thank you for taking time to come for your Medicare Wellness Visit. I appreciate your ongoing commitment to your health goals. Please review the following plan we discussed and let me know if I can assist you in the future.   Screening recommendations/referrals: Colonoscopy: not recommended  Mammogram: up to date Bone Density: Education provided Recommended yearly ophthalmology/optometry visit for glaucoma screening and checkup Recommended yearly dental visit for hygiene and checkup  Vaccinations: Influenza vaccine:   Education provided Pneumococcal vaccine: not indicated Tdap vaccine: not indicated Shingles vaccine: not indicated    Advanced directives: Education provided  Conditions/risks identified:   Next appointment: 11-01-2022 @ 10:440 [facility]   Preventive Care 50 Years and Older, Female Preventive care refers to lifestyle choices and visits with your health care provider that can promote health and wellness. What does preventive care include? A yearly physical exam. This is also called an annual well check. Dental exams once or twice a year. Routine eye exams. Ask your health care provider how often you should have your eyes checked. Personal lifestyle choices, including: Daily care of your teeth and gums. Regular physical activity. Eating a healthy diet. Avoiding tobacco and drug use. Limiting alcohol use. Practicing safe sex. Taking low-dose aspirin every day. Taking vitamin and mineral supplements as recommended by your health care provider. What happens during an annual well check? The services and screenings done by your health care provider during your annual well check will depend on your age, overall health, lifestyle risk factors, and family history of disease. Counseling  Your health care provider may ask you questions about your: Alcohol use. Tobacco use. Drug use. Emotional well-being. Home and relationship well-being. Sexual  activity. Eating habits. History of falls. Memory and ability to understand (cognition). Work and work Statistician. Reproductive health. Screening  You may have the following tests or measurements: Height, weight, and BMI. Blood pressure. Lipid and cholesterol levels. These may be checked every 5 years, or more frequently if you are over 43 years old. Skin check. Lung cancer screening. You may have this screening every year starting at age 93 if you have a 30-pack-year history of smoking and currently smoke or have quit within the past 15 years. Fecal occult blood test (FOBT) of the stool. You may have this test every year starting at age 21. Flexible sigmoidoscopy or colonoscopy. You may have a sigmoidoscopy every 5 years or a colonoscopy every 10 years starting at age 32. Hepatitis C blood test. Hepatitis B blood test. Sexually transmitted disease (STD) testing. Diabetes screening. This is done by checking your blood sugar (glucose) after you have not eaten for a while (fasting). You may have this done every 1-3 years. Bone density scan. This is done to screen for osteoporosis. You may have this done starting at age 65. Mammogram. This may be done every 1-2 years. Talk to your health care provider about how often you should have regular mammograms. Talk with your health care provider about your test results, treatment options, and if necessary, the need for more tests. Vaccines  Your health care provider may recommend certain vaccines, such as: Influenza vaccine. This is recommended every year. Tetanus, diphtheria, and acellular pertussis (Tdap, Td) vaccine. You may need a Td booster every 10 years. Zoster vaccine. You may need this after age 6. Pneumococcal 13-valent conjugate (PCV13) vaccine. One dose is recommended after age 58. Pneumococcal polysaccharide (PPSV23) vaccine. One dose is recommended after age 18. Talk to your health care provider about which  screenings and vaccines  you need and how often you need them. This information is not intended to replace advice given to you by your health care provider. Make sure you discuss any questions you have with your health care provider. Document Released: 12/02/2015 Document Revised: 07/25/2016 Document Reviewed: 09/06/2015 Elsevier Interactive Patient Education  2017 [place] Prevention in the Home Falls can cause injuries. They can happen to people of all ages. There are many things you can do to make your home safe and to help prevent falls. What can I do on the outside of my home? Regularly fix the edges of walkways and driveways and fix any cracks. Remove anything that might make you trip as you walk through a door, such as a raised step or threshold. Trim any bushes or trees on the path to your home. Use bright outdoor lighting. Clear any walking paths of anything that might make someone trip, such as rocks or tools. Regularly check to see if handrails are loose or broken. Make sure that both sides of any steps have handrails. Any raised decks and porches should have guardrails on the edges. Have any leaves, snow, or ice cleared regularly. Use sand or salt on walking paths during winter. Clean up any spills in your garage right away. This includes oil or grease spills. What can I do in the bathroom? Use night lights. Install grab bars by the toilet and in the tub and shower. Do not use towel bars as grab bars. Use non-skid mats or decals in the tub or shower. If you need to sit down in the shower, use a plastic, non-slip stool. Keep the floor dry. Clean up any water that spills on the floor as soon as it happens. Remove soap buildup in the tub or shower regularly. Attach bath mats securely with double-sided non-slip rug tape. Do not have throw rugs and other things on the floor that can make you trip. What can I do in the bedroom? Use night lights. Make sure that you have a light by your bed that  is easy to reach. Do not use any sheets or blankets that are too big for your bed. They should not hang down onto the floor. Have a firm chair that has side arms. You can use this for support while you get dressed. Do not have throw rugs and other things on the floor that can make you trip. What can I do in the kitchen? Clean up any spills right away. Avoid walking on wet floors. Keep items that you use a lot in easy-to-reach places. If you need to reach something above you, use a strong step stool that has a grab bar. Keep electrical cords out of the way. Do not use floor polish or wax that makes floors slippery. If you must use wax, use non-skid floor wax. Do not have throw rugs and other things on the floor that can make you trip. What can I do with my stairs? Do not leave any items on the stairs. Make sure that there are handrails on both sides of the stairs and use them. Fix handrails that are broken or loose. Make sure that handrails are as long as the stairways. Check any carpeting to make sure that it is firmly attached to the stairs. Fix any carpet that is loose or worn. Avoid having throw rugs at the top or bottom of the stairs. If you do have throw rugs, attach them to the floor with carpet  tape. Make sure that you have a light switch at the top of the stairs and the bottom of the stairs. If you do not have them, ask someone to add them for you. What else can I do to help prevent falls? Wear shoes that: Do not have high heels. Have rubber bottoms. Are comfortable and fit you well. Are closed at the toe. Do not wear sandals. If you use a stepladder: Make sure that it is fully opened. Do not climb a closed stepladder. Make sure that both sides of the stepladder are locked into place. Ask someone to hold it for you, if possible. Clearly mark and make sure that you can see: Any grab bars or handrails. First and last steps. Where the edge of each step is. Use tools that help you  move around (mobility aids) if they are needed. These include: Canes. Walkers. Scooters. Crutches. Turn on the lights when you go into a dark area. Replace any light bulbs as soon as they burn out. Set up your furniture so you have a clear path. Avoid moving your furniture around. If any of your floors are uneven, fix them. If there are any pets around you, be aware of where they are. Review your medicines with your doctor. Some medicines can make you feel dizzy. This can increase your chance of falling. Ask your doctor what other things that you can do to help prevent falls. This information is not intended to replace advice given to you by your health care provider. Make sure you discuss any questions you have with your health care provider. Document Released: 09/01/2009 Document Revised: 04/12/2016 Document Reviewed: 12/10/2014 Elsevier Interactive Patient Education  2017 [employer].

## 2022-05-24 NOTE — Progress Notes (Signed)
Subjective:   Meredith Butler is a 66 y.o. female who presents for an Initial Medicare Annual Wellness Visit.  I connected with  [name] on 05/24/22 by a telephone enabled telemedicine application and verified that I am speaking with the correct person using two identifiers.   I discussed the limitations of evaluation and management by telemedicine. The patient expressed understanding and agreed to proceed.  Patient location: home  Provider location: Tele-Health-home    Review of Systems     Cardiac Risk Factors include: advanced age (>29mn, >>62women);diabetes mellitus;hypertension;family history of premature cardiovascular disease;obesity (BMI >30kg/m2);sedentary lifestyle     Objective:    Today's Vitals   There is no height or weight on file to calculate BMI.     05/24/2022   10:23 AM 01/05/2020    6:16 AM 01/14/2018    3:39 PM 04/04/2017    3:25 PM  Advanced Directives  Does Patient Have a Medical Advance Directive? No No No No  Would patient like information on creating a medical advance directive? No - Patient declined No - Patient declined No - Patient declined     Current Medications (verified) Outpatient Encounter Medications as of 05/24/2022  Medication Sig   albuterol (VENTOLIN HFA) 108 (90 Base) MCG/ACT inhaler TAKE 2 PUFFS BY MOUTH EVERY 6 HOURS AS NEEDED FOR WHEEZE OR SHORTNESS OF BREATH   amLODipine (NORVASC) 5 MG tablet Take 1 tablet (5 mg total) by mouth daily.   aspirin 325 MG tablet Take 325 mg by mouth daily.   EPINEPHrine 0.3 mg/0.3 mL IJ SOAJ injection Inject 0.3 mg into the muscle as needed for anaphylaxis.   ezetimibe (ZETIA) 10 MG tablet Take 1 tablet (10 mg total) by mouth daily.   lisinopril (ZESTRIL) 20 MG tablet Take 1 tablet (20 mg total) by mouth daily.   lisinopril-hydrochlorothiazide (ZESTORETIC) 20-25 MG tablet Take 1 tablet by mouth daily.   Magnesium 250 MG TABS Take 250 mg by mouth daily as needed (leg cramps.).    metFORMIN (GLUCOPHAGE)  1000 MG tablet Take 1 tablet (1,000 mg total) by mouth 2 (two) times daily with a meal.   ONETOUCH VERIO test strip USE TO CHECK BLOOD SUGAR ONCE DAILY   VITAMIN D PO Take 1,000 Int'l Units/day by mouth.   No facility-administered encounter medications on file as of 05/24/2022.    Allergies (verified) Claforan [cefotaxime], Ibuprofen, Omnicef [cefdinir], Penicillins, Sulfa antibiotics, Tetanus toxoids, Voltaren [diclofenac sodium], Codeine, Diovan [valsartan], Rocephin [ceftriaxone], Statins, and Streptococcus (diplococcus) pneumoniae [streptococci]   History: Past Medical History:  Diagnosis Date   Asthma    Diabetes mellitus without complication ([facility]    Fibromyalgia    Lupus ([facility]    [place] [place]    Past Surgical History:  Procedure Laterality Date   ABDOMINAL SURGERY     APPENDECTOMY     CHOLECYSTECTOMY     COLON SURGERY     JOINT REPLACEMENT Bilateral    knee   LEFT HEART CATH AND CORONARY ANGIOGRAPHY N/A 01/05/2020   Procedure: LEFT HEART CATH AND CORONARY ANGIOGRAPHY;  Surgeon: JMartinique Peter M, MD;  [facility];  Service: Cardiovascular;  Laterality: N/A;   SMALL INTESTINE SURGERY     SPLENECTOMY, TOTAL     TONSILLECTOMY     TOTAL ABDOMINAL HYSTERECTOMY W/ BILATERAL SALPINGOOPHORECTOMY     complete   Family History  Problem Relation Age of Onset   Heart attack Mother    Cancer Mother    Diabetes Mother  Heart attack Father    Diabetes Sister    Diabetes Daughter    Cancer Maternal Grandmother        ovarian   Cancer Paternal Grandfather        colon   Cancer Sister        lung   Diabetes Daughter    Diabetes Maternal Aunt    Diabetes Maternal Uncle    Social History   Socioeconomic History   Marital status: Married    Spouse name: Not on file   Number of children: Not on file   Years of education: Not on file   Highest education level: Not on file  Occupational History   Not on file  Tobacco Use   Smoking status: Former     Packs/day: 2.00    Years: 30.00    Total pack years: 60.00    Types: Cigarettes    Quit date: 11/20/2009    Years since quitting: 12.5    Passive exposure: Past   Smokeless tobacco: Never  Vaping Use   Vaping Use: Never used  Substance and Sexual Activity   Alcohol use: No   Drug use: No   Sexual activity: Yes  Other Topics Concern   Not on file  Social History Narrative   Not on file   Social Determinants of Health   Financial Resource Strain: Low Risk  (05/24/2022)   Overall Financial Resource Strain (CARDIA)    Difficulty of Paying Living Expenses: Not hard at all  Food Insecurity: No Food Insecurity (05/24/2022)   Hunger Vital Sign    Worried About Running Out of Food in the Last Year: Never true    Ran Out of Food in the Last Year: Never true  Transportation Needs: No Transportation Needs (05/24/2022)   PRAPARE - Hydrologist (Medical): No    Lack of Transportation (Non-Medical): No  Physical Activity: Inactive (05/24/2022)   Exercise Vital Sign    Days of Exercise per Week: 0 days    Minutes of Exercise per Session: 0 min  Stress: No Stress Concern Present (05/24/2022)   [place]    Feeling of Stress : Not at all  Social Connections: Moderately Integrated (05/24/2022)   Social Connection and Isolation Panel [NHANES]    Frequency of Communication with Friends and Family: More than three times a week    Frequency of Social Gatherings with Friends and Family: Once a week    Attends Religious Services: More than 4 times per year    Active Member of [employer] or Organizations: No    Attends Music therapist: Never    Marital Status: Married    Tobacco Counseling Counseling given: Not Answered   Clinical Intake:  Pre-visit preparation completed: Yes  Pain : No/denies pain     Nutritional Risks: None Diabetes: Yes CBG done?: No Did pt. bring in CBG monitor from  home?: No  How often do you need to have someone help you when you read instructions, pamphlets, or other written materials from your doctor or pharmacy?: 1 - Never  Diabetic? Yes  Nutrition Risk Assessment:  Has the patient had any N/V/D within the last 2 months?  No  Does the patient have any non-healing wounds?  No  Has the patient had any unintentional weight loss or weight gain?  No   Diabetes:  Is the patient diabetic?  Yes  If diabetic, was a CBG obtained  today?  No  Did the patient bring in their glucometer from home?  No  How often do you monitor your CBG's?  2x day.   Financial Strains and Diabetes Management:  Are you having any financial strains with the device, your supplies or your medication? No .  Does the patient want to be seen by Chronic Care Management for management of their diabetes?  No  Would the patient like to be referred to a Nutritionist or for Diabetic Management?  No   Diabetic Exams:  Diabetic Eye Exam:  Pt has been advised about the importance in completing this exam.   Diabetic Foot Exam: Pt has been advised about the importance in completing this exam.  Interpreter Needed?: No  Information entered by :: Leroy Kennedy LPN   Activities of Daily Living    05/24/2022   10:24 AM 07/12/2021   10:58 AM  In your present state of health, do you have any difficulty performing the following activities:  Hearing? 1 1  Vision? 0 0  Difficulty concentrating or making decisions? 0 0  Walking or climbing stairs? 0 0  Dressing or bathing? 0 0  Doing errands, shopping? 0 0  Preparing Food and eating ? N   Using the Toilet? N   In the past six months, have you accidently leaked urine? N   Do you have problems with loss of bowel control? N   Managing your Medications? N   Managing your Finances? N   Housekeeping or managing your Housekeeping? N     Patient Care Team: [name], NP as PCP - General [place]on Kingfisher, Junita Push, [facility] (Inactive)  (Pharmacist) Vladimir Faster, [facility] (Inactive) (Pharmacist)  Indicate any recent Medical Services you may have received from other than [facility] providers in the past year (date may be approximate).     Assessment:   This is a routine wellness examination for Mariha.  Hearing/Vision screen Hearing Screening - Comments:: States has some trouble hearing Does not have hearing Vision Screening - Comments:: Dr. Lois Huxley  Dietary issues and exercise activities discussed: Current Exercise Habits: The patient does not participate in regular exercise at present   Goals Addressed             This Visit's Progress    Patient Stated       Would like to breath better       Depression Screen    05/24/2022   10:26 AM 05/02/2022   10:38 AM 01/08/2022   10:12 AM 10/17/2021    9:07 AM 07/12/2021   10:58 AM 05/30/2021   11:07 AM 03/23/2021    9:12 AM  PHQ 2/9 Scores  PHQ - 2 Score 3 0 0 0 0 0 2  PHQ- 9 Score 8 0 1 0  3 11    Fall Risk    05/24/2022   10:07 AM 05/02/2022   10:36 AM 01/08/2022   10:12 AM 10/17/2021    9:07 AM 07/12/2021   10:58 AM  Fall Risk   Falls in the past year? 0 0 0 0 0  Number falls in past yr: 0 0 0 0 0  Injury with Fall? 0 0 0 0 0  Risk for fall due to :  No Fall Risks No Fall Risks No Fall Risks No Fall Risks  Follow up Falls evaluation completed;Education provided;Falls prevention discussed Falls evaluation completed Falls evaluation completed Falls evaluation completed Falls evaluation completed    FALL RISK PREVENTION PERTAINING TO THE HOME:  Any stairs in or around the home? No  If so, are there any without handrails? No  Home free of loose throw rugs in walkways, pet beds, electrical cords, etc? Yes  Adequate lighting in your home to reduce risk of falls? Yes   ASSISTIVE DEVICES UTILIZED TO PREVENT FALLS:  Life alert? No  Use of a cane, walker or w/c? No  Grab bars in the bathroom? Yes  Shower chair or bench in shower? Yes  Elevated toilet  seat or a handicapped toilet? No   TIMED UP AND GO:  Was the test performed? No .   Cognitive Function:        05/24/2022   10:04 AM  6CIT Screen  What Year? 0 points  What month? 0 points  What time? 0 points  Count back from 20 0 points  Months in reverse 0 points  Repeat phrase 0 points  Total Score 0 points    Immunizations  There is no immunization history on file for this patient.    Flu Vaccine status: Declined, Education has been provided regarding the importance of this vaccine but patient still declined. Advised may receive this vaccine at local pharmacy or Health Dept. Aware to provide a copy of the vaccination record if obtained from local pharmacy or Health Dept. Verbalized acceptance and understanding.  Pneumococcal vaccine status: Declined,  Education has been provided regarding the importance of this vaccine but patient still declined. Advised may receive this vaccine at local pharmacy or Health Dept. Aware to provide a copy of the vaccination record if obtained from local pharmacy or Health Dept. Verbalized acceptance and understanding.   Covid-19 vaccine status: Declined, Education has been provided regarding the importance of this vaccine but patient still declined. Advised may receive this vaccine at local pharmacy or Health Dept.or vaccine clinic. Aware to provide a copy of the vaccination record if obtained from local pharmacy or Health Dept. Verbalized acceptance and understanding.  Qualifies for Shingles Vaccine? Yes   Zostavax completed No   Shingrix Completed?: No.    Education has been provided regarding the importance of this vaccine. Patient has been advised to call insurance company to determine out of pocket expense if they have not yet received this vaccine. Advised may also receive vaccine at local pharmacy or Health Dept. Verbalized acceptance and understanding.  Screening Tests Health Maintenance  Topic Date Due   Hepatitis C Screening  Never  done   OPHTHALMOLOGY EXAM  05/24/2022 (Originally 07/15/2021)   COVID-19 Vaccine (1) 06/09/2022 (Originally 10/28/1956)   DEXA SCAN  07/12/2022 (Originally 04/28/2021)   Zoster Vaccines- Shingrix (1 of 2) 08/24/2022 (Originally 04/28/2006)   Pneumonia Vaccine 60+ Years old (1 - PCV) 10/17/2022 (Originally 04/28/1962)   COLONOSCOPY (Pts 45-64yr Insurance coverage will need to be confirmed)  05/25/2023 (Originally 04/28/2001)   INFLUENZA VACCINE  06/19/2022   FOOT EXAM  10/17/2022   HEMOGLOBIN A1C  11/01/2022   MAMMOGRAM  03/08/2024   HPV VACCINES  Aged Out   TETANUS/TDAP  Discontinued    Health Maintenance  Health Maintenance Due  Topic Date Due   Hepatitis C Screening  Never done    Colonoscopy declined patient states only half a colon  Mammogram status: Completed  . Repeat every year  Bone Density will schedule next year with Mammogram  Lung Cancer Screening: (Low Dose CT Chest recommended if Age 66-80years, 30 pack-year currently smoking OR have quit w/in 15years.) does qualify.   Lung Cancer Screening Referral: patient has had  2023  Additional Screening:  Hepatitis C Screening: does qualify  Vision Screening: Recommended annual ophthalmology exams for early detection of glaucoma and other disorders of the eye. Is the patient up to date with their annual eye exam?  Yes  Who is the provider or what is the name of the office in which the patient attends annual eye exams? [facility] If pt is not established with a provider, would they like to be referred to a provider to establish care? No .   Dental Screening: Recommended annual dental exams for proper oral hygiene  Community Resource Referral / Chronic Care Management: CRR required this visit?  No   CCM required this visit?  No      Plan:     I have personally reviewed and noted the following in the patient's chart:   Medical and social history Use of alcohol, tobacco or illicit drugs  Current medications and  supplements including opioid prescriptions. Patient is not currently taking opioid prescriptions. Functional ability and status Nutritional status Physical activity Advanced directives List of other physicians Hospitalizations, surgeries, and ER visits in previous 12 months Vitals Screenings to include cognitive, depression, and falls Referrals and appointments  In addition, I have reviewed and discussed with patient certain preventive protocols, quality metrics, and best practice recommendations. A written personalized care plan for preventive services as well as general preventive health recommendations were provided to patient.     Leroy Kennedy, LPN   4/0/9811   Nurse Notes:

## 2022-05-29 ENCOUNTER — Other Ambulatory Visit: Payer: Self-pay | Admitting: Urology

## 2022-05-29 DIAGNOSIS — N2889 Other specified disorders of kidney and ureter: Secondary | ICD-10-CM

## 2022-05-31 ENCOUNTER — Encounter: Payer: Self-pay | Admitting: *Deleted

## 2022-05-31 ENCOUNTER — Ambulatory Visit
Admission: RE | Admit: 2022-05-31 | Discharge: 2022-05-31 | Disposition: A | Discharge status: Home / Self Care | Payer: Medicare Other | Source: Ambulatory Visit | Attending: Urology | Admitting: Urology

## 2022-05-31 DIAGNOSIS — N2889 Other specified disorders of kidney and ureter: Secondary | ICD-10-CM

## 2022-05-31 HISTORY — PX: IR RADIOLOGIST EVAL & MGMT: IMG5224

## 2022-05-31 NOTE — Consult Note (Addendum)
Chief Complaint: Left renal mass  Referring Physician(s): Sninsky,Brian C  PCP: [name] FNP  History of Present Illness: Meredith Butler is a 66 y.o. female presenting today as a scheduled consultation to [facility], kindly referred by Dr. Diamantina Providence, for evaluation of left renal mass and possible image guided ablation and biopsy.   Meredith Butler joins [place] today in the clinic with her husband.    She tells me that this mass was discovered on a recent chest CT, and then she had further imaging which included [place] and then MR.    She denies any hematuria or flank pain.  She denies any strong family history of renal cancers. She does has a brother in law that has been treated she says for kidney cancer.    Screening lung CT was performed in April showing possible kidney mass.  [place] then was performed 04/24/22, with the mass confirmed measuring about 2.5cm. MRI was then done 05/10/22.  My measurement is ~2.7cm.    Renal nephrometry score is 8x, which corresponds to intermediate complexity of treatment. I think the most complicating features is the fact that there is a fairly endophytic component which has a very close margin to the collecting system, probably 38m or less.   She denies any history of stroke or MI.    She has been to discuss with Dr. SDiamantina Providence and I've reviewed his notes.     Past Medical History:  Diagnosis Date   Asthma    Diabetes mellitus without complication ([place]    Fibromyalgia    Lupus ([place]    Peritonitis ([facility]     Past Surgical History:  Procedure Laterality Date   ABDOMINAL SURGERY     APPENDECTOMY     CHOLECYSTECTOMY     COLON SURGERY     JOINT REPLACEMENT Bilateral    knee   LEFT HEART CATH AND CORONARY ANGIOGRAPHY N/A 01/05/2020   Procedure: LEFT HEART CATH AND CORONARY ANGIOGRAPHY;  Surgeon: JMartinique Peter M, MD;  [facility];  Service: Cardiovascular;  Laterality: N/A;   SMALL INTESTINE SURGERY     SPLENECTOMY, TOTAL     TONSILLECTOMY      TOTAL ABDOMINAL HYSTERECTOMY W/ BILATERAL SALPINGOOPHORECTOMY     complete    Allergies: Claforan [cefotaxime], Ibuprofen, Omnicef [cefdinir], Penicillins, Sulfa antibiotics, Tetanus toxoids, Voltaren [diclofenac sodium], Codeine, Diovan [valsartan], Rocephin [ceftriaxone], Statins, and Streptococcus (diplococcus) pneumoniae [streptococci]  Medications: Prior to Admission medications   Medication Sig Start Date End Date Taking? Authorizing Provider  albuterol (VENTOLIN HFA) 108 (90 Base) MCG/ACT inhaler TAKE 2 PUFFS BY MOUTH EVERY 6 HOURS AS NEEDED FOR WHEEZE OR SHORTNESS OF BREATH 05/02/22   HJon Billings NP  amLODipine (NORVASC) 5 MG tablet Take 1 tablet (5 mg total) by mouth daily. 05/02/22   HJon Billings NP  aspirin 325 MG tablet Take 325 mg by mouth daily.    [provider]  EPINEPHrine 0.3 mg/0.3 mL IJ SOAJ injection Inject 0.3 mg into the muscle as needed for anaphylaxis. 05/02/22   HJon Billings NP  ezetimibe (ZETIA) 10 MG tablet Take 1 tablet (10 mg total) by mouth daily. 05/02/22   HJon Billings NP  lisinopril (ZESTRIL) 20 MG tablet Take 1 tablet (20 mg total) by mouth daily. 05/02/22   HJon Billings NP  lisinopril-hydrochlorothiazide (ZESTORETIC) 20-25 MG tablet Take 1 tablet by mouth daily. 05/02/22   HJon Billings NP  Magnesium 250 MG TABS Take 250 mg by mouth daily as needed (leg cramps.).  [provider]  metFORMIN (GLUCOPHAGE) 1000 MG tablet Take 1 tablet (1,000 mg total) by mouth 2 (two) times daily with a meal. 05/02/22   [name], NP  [facility] VERIO test strip USE TO CHECK BLOOD SUGAR ONCE DAILY 05/02/22   [name], NP  VITAMIN D PO Take 1,000 Int'l Units/day by mouth.    [provider]     Family History  Problem Relation Age of Onset   Heart attack Mother    Cancer Mother    Diabetes Mother    Heart attack Father    Diabetes Sister    Diabetes Daughter    Cancer Maternal Grandmother         ovarian   Cancer Paternal Grandfather        colon   Cancer Sister        lung   Diabetes Daughter    Diabetes Maternal Aunt    Diabetes Maternal Uncle     Social History   Socioeconomic History   Marital status: Married    Spouse name: Not on file   Number of children: Not on file   Years of education: Not on file   Highest education level: Not on file  Occupational History   Not on file  Tobacco Use   Smoking status: Former    Packs/day: 2.00    Years: 30.00    Total pack years: 60.00    Types: Cigarettes    Quit date: 11/20/2009    Years since quitting: 12.5    Passive exposure: Past   Smokeless tobacco: Never  Vaping Use   Vaping Use: Never used  Substance and Sexual Activity   Alcohol use: No   Drug use: No   Sexual activity: Yes  Other Topics Concern   Not on file  Social History Narrative   Not on file   Social Determinants of Health   Financial Resource Strain: Low Risk  (05/24/2022)   Overall Financial Resource Strain (CARDIA)    Difficulty of Paying Living Expenses: Not hard at all  Food Insecurity: No Food Insecurity (05/24/2022)   Hunger Vital Sign    Worried About Running Out of Food in the Last Year: Never true    Ran Out of Food in the Last Year: Never true  Transportation Needs: No Transportation Needs (05/24/2022)   PRAPARE - Hydrologist (Medical): No    Lack of Transportation (Non-Medical): No  Physical Activity: Inactive (05/24/2022)   Exercise Vital Sign    Days of Exercise per Week: 0 days    Minutes of Exercise per Session: 0 min  Stress: No Stress Concern Present (05/24/2022)   [place]    Feeling of Stress : Not at all  Social Connections: Moderately Integrated (05/24/2022)   Social Connection and Isolation Panel [NHANES]    Frequency of Communication with Friends and Family: More than three times a week    Frequency of Social Gatherings with  Friends and Family: Once a week    Attends Religious Services: More than 4 times per year    Active Member of [employer] or Organizations: No    Attends Archivist Meetings: Never    Marital Status: Married    ECOG Status: 0 - Asymptomatic  Review of Systems: A 12 point ROS discussed and pertinent positives are indicated in the HPI above.  All other systems are negative.  Review of Systems  Vital Signs:  BP 123/81 (BP Location: Right Arm)   Pulse 82   SpO2 96%   Advance Care Plan: The advanced care plan/surrogate decision maker was discussed at the time of visit and documented in the medical record.    Physical Exam General: 66 yo female appearing stated age.  Well-developed, well-nourished.  No distress. HEENT: Atraumatic, normocephalic.  Conjugate gaze, extra-ocular motor intact. No scleral icterus or scleral injection. No lesions on external ears, nose, lips, or gums.  Oral mucosa moist, pink.  Neck: Symmetric with no goiter enlargement.  Chest/Lungs:  Symmetric chest with inspiration/expiration.  No labored breathing.  Clear to auscultation with no wheezes, rhonchi, or rales.  Heart:  RRR, with no third heart sounds appreciated. No JVD appreciated.  Abdomen:  Soft, NT/ND, mildly obese, with + bowel sounds.   Genito-urinary: Deferred Neurologic: Alert & Oriented to person, place, and time.   Normal affect and insight.  Appropriate questions.  Moving all 4 extremities with gross sensory intact.  Pulse Exam:  No bruit appreciated.  Palpable pedal pulses.   Extremities: No wound.  No pitting edema.   Mallampati Score:     Imaging: MR Abdomen W Wo Contrast  Result Date: 05/10/2022 CLINICAL DATA:  Indeterminate renal mass EXAM: MRI ABDOMEN WITHOUT AND WITH CONTRAST TECHNIQUE: Multiplanar multisequence MR imaging of the abdomen was performed both before and after the administration of intravenous contrast. CONTRAST:  51m GADAVIST GADOBUTROL 1 MMOL/ML IV SOLN COMPARISON:   Renal ultrasound 04/24/2022 FINDINGS: Lower chest: No acute findings. Hepatobiliary: Liver is prominent with evidence of marked hepatic steatosis. No suspicious hepatic mass identified. Gallbladder appears normal. No biliary ductal dilatation identified. Pancreas: No mass, inflammatory changes, or other parenchymal abnormality identified. Spleen:  Within normal limits in size and appearance. Adrenals/Urinary Tract: Adrenal glands are normal. Partially exophytic solid heterogeneously enhancing mass at the upper pole left kidney which measures 3 x 2.5 x 2.5 cm. Several simple appearing renal cortical cysts bilaterally measuring up to 1.5 cm in the mid left kidney. No hydronephrosis. Stomach/Bowel: Visualized portions within the abdomen are unremarkable. Vascular/Lymphatic: No pathologically enlarged lymph nodes identified. Renal veins are unremarkable. No abdominal aortic aneurysm demonstrated. Other: Approximately 2 cm umbilical hernia containing fat partially visualized. No ascites. Musculoskeletal: No suspicious bone lesions identified. IMPRESSION: 1. 3 cm enhancing mass at the upper pole left kidney, which should be considered renal cell carcinoma until proven otherwise. No lymphadenopathy or venous involvement visualized. 2. Marked hepatic steatosis. Electronically Signed   By: DOfilia NeasM.D.   On: 05/10/2022 16:53    Labs:  CBC: No results for input(s): "WBC", "HGB", "HCT", "PLT" in the last 8760 hours.  COAGS: No results for input(s): "INR", "APTT" in the last 8760 hours.  BMP: Recent Labs    10/17/21 0927 01/08/22 1045 05/02/22 1107  NA 140 139 139  K 4.1 4.3 4.7  CL 100 98 99  CO2 '25 26 21  '$ GLUCOSE 172* 130* 120*  BUN '8 12 15  '$ CALCIUM 9.9 10.0 10.1  CREATININE 0.75 0.60 0.89    LIVER FUNCTION TESTS: Recent Labs    10/17/21 0927 01/08/22 1045 05/02/22 1107  BILITOT 0.7 0.7 0.7  AST 27 23 33  ALT 35* 30 40*  ALKPHOS 61 64 60  PROT 6.8 7.2 7.2  ALBUMIN 4.4 4.8 4.7     TUMOR MARKERS: No results for input(s): "AFPTM", "CEA", "CA199", "CHROMGRNA" in the last 8760 hours.  Assessment and Plan:  Meredith NScheideggeris a 66yo female with a left  renal mass, suspicious for RCC.    2.7cm mass, compatible with Stage 1a tumor, assuming RCC.   I had a lengthy discussion with Meredith Tavenner and her husband regarding the pertinent anatomy, pathology/pathophysiology, and natural history of RCC, including the common way we find these which is incidentally, as well as possible treatment paradigms.  Possible treatments for this size renal tumor might include active surveillance, surgical resection such as nephron sparing surgery or other, or minimally invasive means of treating such as thermal ablation with either cryotherapy or microwave.  Biopsy, as I also explained to her can play a role either before a decision to treat, or during an ablation treatment.    Regarding minimally invasive options, I focused on treatment with cryotherapy ablation. I discussed the logistics of treatment, whereby we typically perform at [facility], with general anesthesia, using combination of [place] and CT imaging, sometimes requiring IV contrast.  We usually have a 23 hr observation period, though sometimes will DC same day if things have gone very smoothly and recovery is assured by the end of the day.  Specific risks discussed include bleeding, infection, local injury to other organs, injury to the collecting system of the kidney, need for hospitalization, recurrence, organ failure, need for further procedure/surgery, blood transfusion, anesthesia risks, cardiopulmonary collapse, death.   Regarding efficacy, I did let her know that we would anticipate a high confidence level of taking care of this problem, probably in the high 90% level, given the size.  We also discussed the need for surveillance afterwards to observe for any recurrence or residual, and that we would anticipate a higher intensity medical care afterwards for  at least about 5 years for surveillance.    After our discussion, she would like to proceed with image guided ablation of left kidney.   Plan: - Plan for image guided cryoablation of the left kidney mass with biopsy, with Dr. Earleen Newport at [facility].  At her convenience - Continue current care  Thank you for this interesting consult.  I greatly enjoyed meeting Demaya Hardge and look forward to participating in their care.  A copy of this report was sent to the requesting provider on this date.  Electronically Signed: Corrie Mckusick 05/31/2022, 3:45 PM   I spent a total of  60 Minutes   in face to face in clinical consultation, greater than 50% of which was counseling/coordinating care for left renal mass, possible ablation with biopsy

## 2022-06-04 ENCOUNTER — Ambulatory Visit: Payer: Self-pay | Admitting: *Deleted

## 2022-06-04 NOTE — Telephone Encounter (Signed)
Message from Luciana Axe sent at 06/04/2022 10:17 AM EDT  Summary: UTI advice   Pt is calling to report Sx pain in lower back and urinary pain, with frequency. Pt declined appt states she lives an hour away and has had UTI previously.  Please advise           Call History   Type Contact Phone/Fax User  06/04/2022 10:15 AM EDT Phone (Incoming) Meredith Butler, Meredith Butler (Self) [phone] Lemmie Evens) Luciana Axe

## 2022-06-04 NOTE — Telephone Encounter (Signed)
Attempted to return call.   Left voicemail to call back to discuss urinary symptoms with a nurse.

## 2022-06-04 NOTE — Telephone Encounter (Signed)
Patient scheduled.

## 2022-06-04 NOTE — Telephone Encounter (Signed)
Summary: UTI advice   Pt is calling to report Sx pain in lower back and urinary pain, with frequency. Pt declined appt states she lives an hour away and has had UTI previously.  Please advise       3rd attempt to contact patient regarding urinary symptoms.  No answer, LVMTCB [phone]. See above note for low back pain, urinary pain and frequency. Please advise.

## 2022-06-04 NOTE — Telephone Encounter (Signed)
Patient has to have an appointment to be able to be prescribed an antibiotic. Please call to schedule.

## 2022-06-05 ENCOUNTER — Ambulatory Visit: Payer: Medicare Other | Admitting: Unknown Physician Specialty

## 2022-06-05 ENCOUNTER — Telehealth: Payer: Medicare Other

## 2022-06-05 ENCOUNTER — Other Ambulatory Visit (HOSPITAL_COMMUNITY): Payer: Self-pay | Admitting: Interventional Radiology

## 2022-06-05 ENCOUNTER — Telehealth: Payer: Self-pay | Admitting: *Deleted

## 2022-06-05 DIAGNOSIS — N2889 Other specified disorders of kidney and ureter: Secondary | ICD-10-CM

## 2022-06-05 NOTE — Telephone Encounter (Signed)
Pt states appt for UTI symptoms was secured for today at 1040 with 'Melissa.' , not tomorrow as noted. Angry affect, states she is in pain and cannot wait. Called practice, [facility], secured appt for 2pm today. Pt stated she cannot make a 2pm appt today, [facility] secured for pt, reviewed process. Care advise provided.

## 2022-06-05 NOTE — Telephone Encounter (Signed)
Pt called back. Pt is very upset regarding appt.  Made virtual appt for pt. For this morning. After doing so the pt stated that she will go to [facility] for treatment.

## 2022-06-06 ENCOUNTER — Ambulatory Visit: Payer: Medicare Other | Admitting: Unknown Physician Specialty

## 2022-06-24 ENCOUNTER — Other Ambulatory Visit: Payer: Self-pay | Admitting: Nurse Practitioner

## 2022-06-25 NOTE — Telephone Encounter (Signed)
Requested Prescriptions  Pending Prescriptions Disp Refills  . albuterol (VENTOLIN HFA) 108 (90 Base) MCG/ACT inhaler [Pharmacy Med Name: ALBUTEROL HFA (PROAIR) INHALER] 8.5 each 1    Sig: TAKE 2 PUFFS BY MOUTH EVERY 6 HOURS AS NEEDED FOR WHEEZE OR SHORTNESS OF BREATH     Pulmonology:  Beta Agonists 2 Passed - 06/24/2022  8:55 AM      Passed - Last BP in normal range    BP Readings from Last 1 Encounters:  05/31/22 123/81         Passed - Last Heart Rate in normal range    Pulse Readings from Last 1 Encounters:  05/31/22 82         Passed - Valid encounter within last 12 months    Recent Outpatient Visits          1 month ago Hypertension associated with diabetes ([place])   [facility] [name], NP   5 months ago Hypertension associated with diabetes ([place])   [facility] [name], NP   8 months ago Annual physical exam   [facility] [name], NP   9 months ago Jaw pain   [facility] McElwee, Lauren A, NP   11 months ago Hypertension associated with diabetes [facility])   [place], Karen, NP      Future Appointments            In 4 months [name], NP [facility] [place]

## 2022-06-26 NOTE — Progress Notes (Addendum)
Surgery orders requested with radiology. 

## 2022-06-29 ENCOUNTER — Other Ambulatory Visit: Payer: Self-pay | Admitting: Radiology

## 2022-06-29 DIAGNOSIS — N2889 Other specified disorders of kidney and ureter: Secondary | ICD-10-CM

## 2022-06-29 NOTE — Patient Instructions (Signed)
DUE TO SPACE LIMITATIONS, ONLY TWO VISITORS  (aged 66 and older) ARE ALLOWED TO COME WITH YOU AND STAY IN THE WAITING ROOM DURING YOUR PRE OP AND PROCEDURE.   **NO VISITORS ARE ALLOWED IN THE SHORT STAY AREA OR RECOVERY ROOM!!**  IF YOU WILL BE ADMITTED INTO THE HOSPITAL YOU ARE ALLOWED ONLY FOUR SUPPORT PEOPLE DURING VISITATION HOURS (7 AM -8PM)   The support person(s) must pass our screening, and use Hand sanitizing gel. Visitors GUEST BADGE MUST BE WORN VISIBLY  One adult visitor may remain with you overnight and MUST be in the room by 8 P.M.   You are not required to quarantine at this time prior to your surgery. However, you must do this: Hand Hygiene often Do NOT share personal items Notify your provider if you are in close contact with someone who has COVID or you develop fever 100.4 or greater, [place] onset of sneezing, cough, sore throat, shortness of breath or body aches.       Your procedure is scheduled on:  Wednesday  July 11, 2022  Report to [facility] Main Entrance.  Report to admitting at:  07:00 AM  +++++Call this number if you have any questions or problems the morning of surgery [phone]  Do not eat or drink anything  :After Midnight the night prior to your surgery/procedure.   FOLLOW BOWEL PREP AND ANY ADDITIONAL PRE OP INSTRUCTIONS YOU RECEIVED FROM YOUR SURGEON'S OFFICE!!!   Oral Hygiene is also important to reduce your risk of infection.        Remember - BRUSH YOUR TEETH THE MORNING OF SURGERY WITH YOUR REGULAR TOOTHPASTE  Take ONLY these medicines the morning of surgery with A SIP OF WATER: Amlodipine, and if needed Tylenol and Albuterol inhaler                  You may not have any metal on your body including hair pins, jewelry, and body piercing  Do not wear make-up, lotions, powders, perfumes or deodorant  Do not wear nail polish including gel and S&S, artificial / acrylic nails, or any other type of covering on natural nails including  finger and toenails. If you have artificial nails, gel coating, etc., that needs to be removed by a nail salon, Please have this removed prior to surgery. Not doing so may mean that your surgery could be cancelled or delayed if the Surgeon or anesthesia staff feels like they are unable to monitor you safely.   Do not shave 48 hours prior to surgery to avoid nicks in your skin which may contribute to postoperative infections.   Contacts, Hearing Aids, dentures or bridgework may not be worn into surgery.   You may bring a small overnight bag with you on the day of surgery, only pack items that are not valuable .[place] IS NOT RESPONSIBLE   FOR VALUABLES THAT ARE LOST OR STOLEN.   DO NOT [place]. PHARMACY WILL DISPENSE MEDICATIONS LISTED ON YOUR MEDICATION LIST TO YOU DURING YOUR ADMISSION [place]!   Special Instructions: Bring a copy of your healthcare power of attorney and living will documents the day of surgery, if you wish to have them scanned into your [place] Medical Records- EPIC  Please read over the following fact sheets you were given: IF YOU HAVE QUESTIONS ABOUT YOUR PRE-OP INSTRUCTIONS, PLEASE CALL [phone]  ([place])   [facility] - Preparing for Surgery Before surgery, you can play an  important role.  Because skin is not sterile, your skin needs to be as free of germs as possible.  You can reduce the number of germs on your skin by washing with CHG (chlorahexidine gluconate) soap before surgery.  CHG is an antiseptic cleaner which kills germs and bonds with the skin to continue killing germs even after washing. Please DO NOT use if you have an allergy to CHG or antibacterial soaps.  If your skin becomes reddened/irritated stop using the CHG and inform your nurse when you arrive at Short Stay. Do not shave (including legs and underarms) for at least 48 hours prior to the first CHG shower.  You may shave your face/neck.  Please follow  these instructions carefully:  1.  Shower with CHG Soap the night before surgery and the  morning of surgery.  2.  If you choose to wash your hair, wash your hair first as usual with your normal  shampoo.  3.  After you shampoo, rinse your hair and body thoroughly to remove the shampoo.                             4.  Use CHG as you would any other liquid soap.  You can apply chg directly to the skin and wash.  Gently with a scrungie or clean washcloth.  5.  Apply the CHG Soap to your body ONLY FROM THE NECK DOWN.   Do not use on face/ open                           Wound or open sores. Avoid contact with eyes, ears mouth and genitals (private parts).                       Wash face,  Genitals (private parts) with your normal soap.             6.  Wash thoroughly, paying special attention to the area where your  surgery  will be performed.  7.  Thoroughly rinse your body with warm water from the neck down.  8.  DO NOT shower/wash with your normal soap after using and rinsing off the CHG Soap.            9.  Pat yourself dry with a clean towel.            10.  Wear clean pajamas.            11.  Place clean sheets on your bed the night of your first shower and do not  sleep with pets.  ON THE DAY OF SURGERY : Do not apply any lotions/deodorants the morning of surgery.  Please wear clean clothes to the hospital/surgery center.    FAILURE TO FOLLOW THESE INSTRUCTIONS MAY RESULT IN THE CANCELLATION OF YOUR SURGERY  PATIENT SIGNATURE_________________________________  NURSE SIGNATURE__________________________________  ________________________________________________________________________

## 2022-06-29 NOTE — Progress Notes (Signed)
COVID Vaccine received:  '[x]'$  No '[]'$  Yes Date of any COVID positive Test in last 90 days: None  PCP - [name], NP Cardiologist - Charolotte Capuchin, MD Urology - Dr. Nickolas Madrid  at [facility].  Chest x-ray -  CT Chest (screening) 02-27-22 EKG -  12-28-19  CEW    Repeat done 07-02-22 Stress Test -  ECHO -  Cardiac Cath - [facility] by Dr. Martinique 01-05-2020  Pacemaker/ICD device     '[x]'$  N/A Spinal Cord Stimulator:'[x]'$  No '[]'$  Yes   Other Implants:   Bowel Prep - none per patient  History of Sleep Apnea? '[x]'$  No '[]'$  Yes   Sleep Study Date:  none CPAP used?- '[x]'$  No '[]'$  Yes  (Instruct to bring their mask & Tubing)  Does the patient monitor blood sugar? '[]'$  No '[x]'$  Yes  '[]'$  N/A Does patient have a [employer] or Dexacom? '[x]'$  No '[]'$  Yes   Fasting Blood Sugar Ranges- 120-180 Checks Blood Sugar __2 times a day  Blood Thinner Instructions: None Aspirin Instructions: ASA 81 mg  Last Dose: no instruction to hold ASA per patient.   ERAS Protocol Ordered: '[x]'$  No  '[]'$  Yes PRE-SURGERY '[]'$  ENSURE  '[]'$  G2   Comments: this procedure will be done in IR, patient instructed to arrive at 07:00 as per IR. There was no order for me to fill out consent; told patient this would be done DOS.   Activity level: Patient can not climb a flight of stairs without difficulty;  '[x]'$  No CP  but would have SOB   Anesthesia review: Emphysema, Hx Angina, Lupus  Patient denies shortness of breath, fever, cough and chest pain at PAT appointment.  Patient verbalized understanding and agreement to the Pre-Surgical Instructions that were given to them at this PAT appointment. Patient was also educated of the need to review these PAT instructions again prior to his/her surgery.I reviewed the appropriate phone numbers to call if they have any and questions or concerns.

## 2022-07-02 ENCOUNTER — Encounter (HOSPITAL_COMMUNITY): Payer: Self-pay

## 2022-07-02 ENCOUNTER — Other Ambulatory Visit: Payer: Self-pay

## 2022-07-02 ENCOUNTER — Ambulatory Visit (HOSPITAL_COMMUNITY)
Admission: RE | Admit: 2022-07-02 | Discharge: 2022-07-02 | Disposition: A | Discharge status: Home / Self Care | Payer: Medicare Other | Source: Ambulatory Visit | Attending: Radiology | Admitting: Radiology

## 2022-07-02 ENCOUNTER — Encounter (HOSPITAL_COMMUNITY)
Admission: RE | Admit: 2022-07-02 | Discharge: 2022-07-02 | Disposition: A | Discharge status: Home / Self Care | Payer: BC Managed Care – PPO | Source: Ambulatory Visit | Attending: Interventional Radiology | Admitting: Interventional Radiology

## 2022-07-02 VITALS — BP 126/88 | HR 80 | Temp 98.1°F | Resp 20 | Ht 68.0 in | Wt 228.0 lb

## 2022-07-02 DIAGNOSIS — E119 Type 2 diabetes mellitus without complications: Secondary | ICD-10-CM | POA: Diagnosis not present

## 2022-07-02 DIAGNOSIS — I10 Essential (primary) hypertension: Secondary | ICD-10-CM | POA: Insufficient documentation

## 2022-07-02 DIAGNOSIS — Z01818 Encounter for other preprocedural examination: Secondary | ICD-10-CM | POA: Insufficient documentation

## 2022-07-02 DIAGNOSIS — N2889 Other specified disorders of kidney and ureter: Secondary | ICD-10-CM | POA: Insufficient documentation

## 2022-07-02 HISTORY — DX: Chronic obstructive pulmonary disease, unspecified: J44.9

## 2022-07-02 HISTORY — DX: Unspecified osteoarthritis, unspecified site: M19.90

## 2022-07-02 HISTORY — DX: Gastro-esophageal reflux disease without esophagitis: K21.9

## 2022-07-02 HISTORY — DX: Anxiety disorder, unspecified: F41.9

## 2022-07-02 HISTORY — DX: Pneumonia, unspecified organism: J18.9

## 2022-07-02 LAB — CBC WITH DIFFERENTIAL/PLATELET
Abs Immature Granulocytes: 0.01 10*3/uL (ref 0.00–0.07)
Basophils Absolute: 0.1 10*3/uL (ref 0.0–0.1)
Basophils Relative: 1 %
Eosinophils Absolute: 0.4 10*3/uL (ref 0.0–0.5)
Eosinophils Relative: 6 %
HCT: 41.6 % (ref 36.0–46.0)
Hemoglobin: 14 g/dL (ref 12.0–15.0)
Immature Granulocytes: 0 %
Lymphocytes Relative: 24 %
Lymphs Abs: 1.4 10*3/uL (ref 0.7–4.0)
MCH: 31 pg (ref 26.0–34.0)
MCHC: 33.7 g/dL (ref 30.0–36.0)
MCV: 92 fL (ref 80.0–100.0)
Monocytes Absolute: 0.5 10*3/uL (ref 0.1–1.0)
Monocytes Relative: 9 %
Neutro Abs: 3.3 10*3/uL (ref 1.7–7.7)
Neutrophils Relative %: 60 %
Platelets: 286 10*3/uL (ref 150–400)
RBC: 4.52 MIL/uL (ref 3.87–5.11)
RDW: 12.3 % (ref 11.5–15.5)
WBC: 5.6 10*3/uL (ref 4.0–10.5)
nRBC: 0 % (ref 0.0–0.2)

## 2022-07-02 LAB — BASIC METABOLIC PANEL
Anion gap: 9 (ref 5–15)
BUN: 16 mg/dL (ref 8–23)
CO2: 26 mmol/L (ref 22–32)
Calcium: 9.7 mg/dL (ref 8.9–10.3)
Chloride: 102 mmol/L (ref 98–111)
Creatinine, Ser: 0.95 mg/dL (ref 0.44–1.00)
GFR, Estimated: >60 mL/min (ref >60)
Glucose, Bld: 114 mg/dL — ABNORMAL HIGH (ref 70–99)
Potassium: 4.3 mmol/L (ref 3.5–5.1)
Sodium: 137 mmol/L (ref 135–145)

## 2022-07-02 LAB — GLUCOSE, CAPILLARY: Glucose-Capillary: 133 mg/dL — ABNORMAL HIGH (ref 70–99)

## 2022-07-02 LAB — HEMOGLOBIN A1C
Hgb A1c MFr Bld: 7.3 % — ABNORMAL HIGH (ref 4.8–5.6)
Mean Plasma Glucose: 162.81 mg/dL

## 2022-07-02 LAB — PROTIME-INR
INR: 1 (ref 0.8–1.2)
Prothrombin Time: 13.2 seconds (ref 11.4–15.2)

## 2022-07-10 ENCOUNTER — Other Ambulatory Visit: Payer: Self-pay | Admitting: Radiology

## 2022-07-11 ENCOUNTER — Ambulatory Visit (HOSPITAL_COMMUNITY): Payer: BC Managed Care – PPO | Admitting: Registered Nurse

## 2022-07-11 ENCOUNTER — Ambulatory Visit (HOSPITAL_COMMUNITY)
Admission: RE | Admit: 2022-07-11 | Discharge: 2022-07-11 | Disposition: A | Discharge status: Home / Self Care | Payer: BC Managed Care – PPO | Source: Ambulatory Visit | Attending: Interventional Radiology | Admitting: Interventional Radiology

## 2022-07-11 ENCOUNTER — Encounter (HOSPITAL_COMMUNITY)
Admission: RE | Disposition: A | Discharge status: Home / Self Care | Payer: Self-pay | Source: Ambulatory Visit | Attending: Interventional Radiology

## 2022-07-11 ENCOUNTER — Encounter (HOSPITAL_COMMUNITY): Payer: Self-pay | Admitting: Interventional Radiology

## 2022-07-11 ENCOUNTER — Other Ambulatory Visit: Payer: Self-pay

## 2022-07-11 DIAGNOSIS — F419 Anxiety disorder, unspecified: Secondary | ICD-10-CM | POA: Insufficient documentation

## 2022-07-11 DIAGNOSIS — M797 Fibromyalgia: Secondary | ICD-10-CM | POA: Diagnosis not present

## 2022-07-11 DIAGNOSIS — N2889 Other specified disorders of kidney and ureter: Secondary | ICD-10-CM

## 2022-07-11 DIAGNOSIS — Z01818 Encounter for other preprocedural examination: Secondary | ICD-10-CM

## 2022-07-11 DIAGNOSIS — E119 Type 2 diabetes mellitus without complications: Secondary | ICD-10-CM | POA: Diagnosis not present

## 2022-07-11 DIAGNOSIS — J449 Chronic obstructive pulmonary disease, unspecified: Secondary | ICD-10-CM | POA: Insufficient documentation

## 2022-07-11 DIAGNOSIS — C642 Malignant neoplasm of left kidney, except renal pelvis: Secondary | ICD-10-CM | POA: Insufficient documentation

## 2022-07-11 DIAGNOSIS — K219 Gastro-esophageal reflux disease without esophagitis: Secondary | ICD-10-CM | POA: Insufficient documentation

## 2022-07-11 DIAGNOSIS — I10 Essential (primary) hypertension: Secondary | ICD-10-CM | POA: Insufficient documentation

## 2022-07-11 HISTORY — PX: RADIOLOGY WITH ANESTHESIA: SHX6223

## 2022-07-11 LAB — GLUCOSE, CAPILLARY
Glucose-Capillary: 162 mg/dL — ABNORMAL HIGH (ref 70–99)
Glucose-Capillary: 232 mg/dL — ABNORMAL HIGH (ref 70–99)

## 2022-07-11 LAB — TYPE AND SCREEN
ABO/RH(D): O POS
Antibody Screen: NEGATIVE

## 2022-07-11 LAB — ABO/RH: ABO/RH(D): O POS

## 2022-07-11 SURGERY — RADIOLOGY WITH ANESTHESIA
Anesthesia: General

## 2022-07-11 MED ORDER — FENTANYL CITRATE PF 50 MCG/ML IJ SOSY
PREFILLED_SYRINGE | INTRAMUSCULAR | Status: AC
  Filled 2022-07-11: qty 1

## 2022-07-11 MED ORDER — IOHEXOL 300 MG/ML  SOLN
100.0000 mL | Freq: Once | INTRAMUSCULAR | Status: AC | PRN
  Administered 2022-07-11: 100 mL via INTRAVENOUS

## 2022-07-11 MED ORDER — LIDOCAINE 2% (20 MG/ML) 5 ML SYRINGE
INTRAMUSCULAR | Status: DC | PRN
  Administered 2022-07-11: 80 mg via INTRAVENOUS

## 2022-07-11 MED ORDER — FENTANYL CITRATE (PF) 100 MCG/2ML IJ SOLN
INTRAMUSCULAR | Status: AC
  Filled 2022-07-11: qty 2

## 2022-07-11 MED ORDER — PROPOFOL 10 MG/ML IV BOLUS
INTRAVENOUS | Status: DC | PRN
  Administered 2022-07-11: 160 mg via INTRAVENOUS

## 2022-07-11 MED ORDER — ACETAMINOPHEN 10 MG/ML IV SOLN: 1000.0000 mg | Freq: Once | INTRAVENOUS | Status: DC | PRN

## 2022-07-11 MED ORDER — LACTATED RINGERS IV SOLN: INTRAVENOUS | Status: DC

## 2022-07-11 MED ORDER — SODIUM CHLORIDE 0.9 % IV SOLN
INTRAVENOUS | Status: AC
  Filled 2022-07-11: qty 500

## 2022-07-11 MED ORDER — SODIUM CHLORIDE (PF) 0.9 % IJ SOLN
INTRAMUSCULAR | Status: AC
  Filled 2022-07-11: qty 50

## 2022-07-11 MED ORDER — ONDANSETRON HCL 4 MG/2ML IJ SOLN: 4.0000 mg | Freq: Four times a day (QID) | INTRAMUSCULAR | Status: DC | PRN

## 2022-07-11 MED ORDER — AMISULPRIDE (ANTIEMETIC) 5 MG/2ML IV SOLN
10.0000 mg | Freq: Once | INTRAVENOUS | Status: AC
Start: 2022-07-11 — End: 2022-07-11
  Administered 2022-07-11: 10 mg via INTRAVENOUS

## 2022-07-11 MED ORDER — ACETAMINOPHEN 10 MG/ML IV SOLN
INTRAVENOUS | Status: DC | PRN
  Administered 2022-07-11: 1000 mg via INTRAVENOUS

## 2022-07-11 MED ORDER — SUGAMMADEX SODIUM 200 MG/2ML IV SOLN
INTRAVENOUS | Status: DC | PRN
  Administered 2022-07-11: 200 mg via INTRAVENOUS

## 2022-07-11 MED ORDER — MIDAZOLAM HCL 5 MG/5ML IJ SOLN
INTRAMUSCULAR | Status: DC | PRN
  Administered 2022-07-11: 2 mg via INTRAVENOUS

## 2022-07-11 MED ORDER — PHENYLEPHRINE HCL-NACL 20-0.9 MG/250ML-% IV SOLN
INTRAVENOUS | Status: DC | PRN
  Administered 2022-07-11: 25 ug/min via INTRAVENOUS

## 2022-07-11 MED ORDER — FENTANYL CITRATE PF 50 MCG/ML IJ SOSY
25.0000 ug | PREFILLED_SYRINGE | INTRAMUSCULAR | Status: DC | PRN
  Administered 2022-07-11 (×4): 50 ug via INTRAVENOUS

## 2022-07-11 MED ORDER — ONDANSETRON 4 MG PO TBDP
ORAL_TABLET | ORAL | Status: AC
  Filled 2022-07-11: qty 1

## 2022-07-11 MED ORDER — ACETAMINOPHEN 160 MG/5ML PO SOLN: 1000.0000 mg | Freq: Once | ORAL | Status: DC | PRN

## 2022-07-11 MED ORDER — ACETAMINOPHEN 500 MG PO TABS: 1000.0000 mg | ORAL_TABLET | Freq: Once | ORAL | Status: DC | PRN

## 2022-07-11 MED ORDER — FENTANYL CITRATE (PF) 100 MCG/2ML IJ SOLN
INTRAMUSCULAR | Status: DC | PRN
  Administered 2022-07-11 (×2): 50 ug via INTRAVENOUS

## 2022-07-11 MED ORDER — ONDANSETRON HCL 4 MG PO TABS
4.0000 mg | ORAL_TABLET | Freq: Once | ORAL | Status: DC
  Filled 2022-07-11: qty 1

## 2022-07-11 MED ORDER — ONDANSETRON HCL 4 MG PO TABS: 4.0000 mg | ORAL_TABLET | Freq: Three times a day (TID) | ORAL | 0 refills | Status: DC | PRN

## 2022-07-11 MED ORDER — ORAL CARE MOUTH RINSE: 15.0000 mL | Freq: Once | OROMUCOSAL | Status: AC

## 2022-07-11 MED ORDER — ONDANSETRON 4 MG PO TBDP
ORAL_TABLET | ORAL | Status: AC
  Administered 2022-07-11: 4 mg
  Filled 2022-07-11: qty 1

## 2022-07-11 MED ORDER — MIDAZOLAM HCL 2 MG/2ML IJ SOLN
INTRAMUSCULAR | Status: AC
Start: 2022-07-11 — End: ?
  Filled 2022-07-11: qty 2

## 2022-07-11 MED ORDER — AMISULPRIDE (ANTIEMETIC) 5 MG/2ML IV SOLN
INTRAVENOUS | Status: AC
  Filled 2022-07-11: qty 4

## 2022-07-11 MED ORDER — ONDANSETRON HCL 4 MG/2ML IJ SOLN
INTRAMUSCULAR | Status: DC | PRN
  Administered 2022-07-11: 4 mg via INTRAVENOUS

## 2022-07-11 MED ORDER — EPHEDRINE SULFATE-NACL 50-0.9 MG/10ML-% IV SOSY
PREFILLED_SYRINGE | INTRAVENOUS | Status: DC | PRN
  Administered 2022-07-11 (×3): 5 mg via INTRAVENOUS

## 2022-07-11 MED ORDER — ACETAMINOPHEN 10 MG/ML IV SOLN
INTRAVENOUS | Status: AC
  Filled 2022-07-11: qty 100

## 2022-07-11 MED ORDER — ROCURONIUM BROMIDE 10 MG/ML (PF) SYRINGE
PREFILLED_SYRINGE | INTRAVENOUS | Status: DC | PRN
  Administered 2022-07-11: 70 mg via INTRAVENOUS
  Administered 2022-07-11: 30 mg via INTRAVENOUS

## 2022-07-11 MED ORDER — DEXAMETHASONE SODIUM PHOSPHATE 10 MG/ML IJ SOLN
INTRAMUSCULAR | Status: DC | PRN
  Administered 2022-07-11: 8 mg via INTRAVENOUS

## 2022-07-11 MED ORDER — LACTATED RINGERS IV SOLN: INTRAVENOUS | Status: AC

## 2022-07-11 MED ORDER — CHLORHEXIDINE GLUCONATE 0.12 % MT SOLN
15.0000 mL | Freq: Once | OROMUCOSAL | Status: DC
  Administered 2022-07-11: 15 mL via OROMUCOSAL

## 2022-07-11 MED ORDER — FENTANYL CITRATE PF 50 MCG/ML IJ SOSY
PREFILLED_SYRINGE | INTRAMUSCULAR | Status: AC
  Filled 2022-07-11: qty 2

## 2022-07-11 NOTE — Anesthesia Procedure Notes (Signed)
Procedure Name: Intubation Date/Time: 07/11/2022 8:52 AM  Performed by: Victoriano Lain, CRNAPre-anesthesia Checklist: Patient identified, Emergency Drugs available, Suction available, Patient being monitored and Timeout performed Patient Re-evaluated:Patient Re-evaluated prior to induction Oxygen Delivery Method: Circle system utilized Preoxygenation: Pre-oxygenation with 100% oxygen Induction Type: IV induction Ventilation: Mask ventilation without difficulty Laryngoscope Size: Mac and 4 Grade View: Grade I Tube type: Oral Tube size: 7.5 mm Number of attempts: 1 Airway Equipment and Method: Stylet Placement Confirmation: ETT inserted through vocal cords under direct vision, positive ETCO2 and breath sounds checked- equal and bilateral Secured at: 22 cm Tube secured with: Tape Dental Injury: Teeth and Oropharynx as per pre-operative assessment

## 2022-07-11 NOTE — Anesthesia Preprocedure Evaluation (Addendum)
Anesthesia Evaluation  Patient identified by MRN, date of birth, ID band Patient awake    Reviewed: Allergy & Precautions, NPO status , Patient's Chart, lab work & pertinent test results  History of Anesthesia Complications Negative for: history of anesthetic complications  Airway Mallampati: III  TM Distance: >3 FB Neck ROM: Full    Dental  (+) Missing, Poor Dentition, Dental Advisory Given   Pulmonary asthma , COPD, former smoker,    breath sounds clear to auscultation       Cardiovascular hypertension, Pt. on medications (-) angina(-) CAD  Rhythm:Regular  ? The left ventricular systolic function is normal. ? LV end diastolic pressure is normal. ? The left ventricular ejection fraction is 55-65% by visual estimate.   1. No significant CAD 2. Normal LV function 3. Normal LVEDP  Plan: risk factor modification.     Neuro/Psych PSYCHIATRIC DISORDERS Anxiety Depression    GI/Hepatic GERD  ,  Endo/Other  diabetes, Type 2Lab Results      Component                Value               Date                      HGBA1C                   7.3 (H)             07/02/2022             Renal/GU Lab Results      Component                Value               Date                      CREATININE               0.95                07/02/2022                Musculoskeletal  (+) Arthritis , Fibromyalgia -  Abdominal   Peds  Hematology Lab Results      Component                Value               Date                      WBC                      5.6                 07/02/2022                HGB                      14.0                07/02/2022                HCT                      41.6  07/02/2022                MCV                      92.0                07/02/2022                PLT                      286                 07/02/2022              Anesthesia Other Findings   Reproductive/Obstetrics                             Anesthesia Physical Anesthesia Plan  ASA: 3  Anesthesia Plan: General   Post-op Pain Management: Ofirmev IV (intra-op)*   Induction: Intravenous  PONV Risk Score and Plan: 3 and Ondansetron and Dexamethasone  Airway Management Planned: Oral ETT  Additional Equipment: None  Intra-op Plan:   Post-operative Plan: Extubation in OR  Informed Consent: I have reviewed the patients History and Physical, chart, labs and discussed the procedure including the risks, benefits and alternatives for the proposed anesthesia with the patient or authorized representative who has indicated his/her understanding and acceptance.     Dental advisory given  Plan Discussed with: CRNA  Anesthesia Plan Comments:         Anesthesia Quick Evaluation

## 2022-07-11 NOTE — Transfer of Care (Signed)
Immediate Anesthesia Transfer of Care Note  Patient: Cari Burgo  Procedure(s) Performed: Renal Cryo Ablation  Patient Location: PACU  Anesthesia Type:General  Level of Consciousness: awake, alert , oriented and patient cooperative  Airway & Oxygen Therapy: Patient Spontanous Breathing and Patient connected to face mask oxygen  Post-op Assessment: Report given to RN, Post -op Vital signs reviewed and stable and Patient moving all extremities  Post vital signs: Reviewed and stable  Last Vitals:  Vitals Value Taken Time  BP 122/91 07/11/22 1215  Temp    Pulse 85 07/11/22 1217  Resp 13 07/11/22 1217  SpO2 100 % 07/11/22 1217  Vitals shown include unvalidated device data.  Last Pain:  Vitals:   07/11/22 0707  TempSrc: Oral         Complications: No notable events documented.

## 2022-07-11 NOTE — Sedation Documentation (Signed)
Anesthesia at bedside to sedate and monitor.

## 2022-07-11 NOTE — Discharge Instructions (Signed)
May resume home medications, stay well-hydrated, avoid strenuous activity for the next 3 to 4 days

## 2022-07-11 NOTE — H&P (Signed)
Referring Physician(s): Sninsky,B  Supervising Physician: Corrie Mckusick  Patient Status:  [facility]  Chief Complaint:  Left renal mass  Subjective: Patient familiar to IR service from consultation with Dr. Earleen Newport on 05/31/2022 to discuss treatment options for left renal mass. She  has a past medical history significant for anxiety, COPD, HTN, arthritis, asthma, fatty liver, GERD, diabetes, fibromyalgia, lupus and presents now with a 2.7- 3 cm mass in the upper pole of the left kidney suspicious for renal cell carcinoma.  Following discussions with Dr. Earleen Newport she was deemed an appropriate candidate for CT-guided cryoablation and possible biopsy of the mass and presents today for the procedure.  She currently denies fever, headache, chest pain, dyspnea, cough, abdominal pain, nausea, vomiting or bleeding.  She does have back pain.  Past Medical History:  Diagnosis Date   Anxiety    Arthritis    Asthma    COPD (chronic obstructive pulmonary disease) (HCC)    Diabetes mellitus without complication (HCC)    Fibromyalgia    GERD (gastroesophageal reflux disease)    Hypertension    Peritonitis ([place])    Pneumonia    Past Surgical History:  Procedure Laterality Date   ABDOMINAL SURGERY     APPENDECTOMY     CHOLECYSTECTOMY     COLON SURGERY     IR RADIOLOGIST EVAL & MGMT  05/31/2022   JOINT REPLACEMENT Bilateral    knee   LEFT HEART CATH AND CORONARY ANGIOGRAPHY N/A 01/05/2020   Procedure: LEFT HEART CATH AND CORONARY ANGIOGRAPHY;  Surgeon: Martinique, Peter M, MD;  Location: [facility];  Service: Cardiovascular;  Laterality: N/A;   SMALL INTESTINE SURGERY     TONSILLECTOMY     TOTAL ABDOMINAL HYSTERECTOMY W/ BILATERAL SALPINGOOPHORECTOMY     complete      Allergies: Claforan [cefotaxime], Ibuprofen, Omnicef [cefdinir], Penicillins, Sulfa antibiotics, Tetanus toxoids, Voltaren [diclofenac sodium], Codeine, Diovan [valsartan], Rocephin [ceftriaxone], Statins, and  Streptococcus (diplococcus) pneumoniae [streptococci]  Medications: Prior to Admission medications   Medication Sig Start Date End Date Taking? Authorizing Provider  acetaminophen (TYLENOL) 500 MG tablet Take 500-1,000 mg by mouth every 6 (six) hours as needed (pain.).   Yes [provider]  albuterol (VENTOLIN HFA) 108 (90 Base) MCG/ACT inhaler TAKE 2 PUFFS BY MOUTH EVERY 6 HOURS AS NEEDED FOR WHEEZE OR SHORTNESS OF BREATH 06/25/22  Yes [name], NP  amLODipine (NORVASC) 5 MG tablet Take 1 tablet (5 mg total) by mouth daily. 05/02/22  Yes [name], NP  aspirin EC 81 MG tablet Take 325 mg by mouth every evening.   Yes [provider]  EPINEPHrine 0.3 mg/0.3 mL IJ SOAJ injection Inject 0.3 mg into the muscle as needed for anaphylaxis. 05/02/22  Yes [name], NP  ezetimibe (ZETIA) 10 MG tablet Take 1 tablet (10 mg total) by mouth daily. Patient taking differently: Take 10 mg by mouth at bedtime. 05/02/22  Yes [name], NP  lisinopril (ZESTRIL) 20 MG tablet Take 1 tablet (20 mg total) by mouth daily. 05/02/22  Yes [name], NP  lisinopril-hydrochlorothiazide (ZESTORETIC) 20-25 MG tablet Take 1 tablet by mouth daily. 05/02/22  Yes [name], NP  Magnesium 250 MG TABS Take 250 mg by mouth daily as needed (leg cramps.).    Yes [provider]  metFORMIN (GLUCOPHAGE) 1000 MG tablet Take 1 tablet (1,000 mg total) by mouth 2 (two) times daily with a meal. 05/02/22  Yes [name], NP  [facility] VERIO test strip USE TO  CHECK BLOOD SUGAR ONCE DAILY 05/02/22  Yes [name], NP     Vital Signs: BP 138/89   Pulse 66   Temp (!) 97.4 F (36.3 C) (Oral)   Resp 16   Ht '5\' 8"'$  (1.727 m)   Wt 227 lb 15.3 oz (103.4 kg)   SpO2 97%   BMI 34.66 kg/m   Physical Exam awake, alert.  Chest clear to auscultation bilaterally.  Heart with regular rate and rhythm.  Abdomen soft, + bowel sounds, nontender.  No significant lower  extremity edema.  Imaging: No results found.  Labs:  CBC: Recent Labs    07/02/22 1352  WBC 5.6  HGB 14.0  HCT 41.6  PLT 286    COAGS: Recent Labs    07/02/22 1352  INR 1.0    BMP: Recent Labs    10/17/21 0927 01/08/22 1045 05/02/22 1107 07/02/22 1352  NA 140 139 139 137  K 4.1 4.3 4.7 4.3  CL 100 98 99 102  CO2 '25 26 21 26  '$ GLUCOSE 172* 130* 120* 114*  BUN '8 12 15 16  '$ CALCIUM 9.9 10.0 10.1 9.7  CREATININE 0.75 0.60 0.89 0.95  GFRNONAA  --   --   --  >60    LIVER FUNCTION TESTS: Recent Labs    10/17/21 0927 01/08/22 1045 05/02/22 1107  BILITOT 0.7 0.7 0.7  AST 27 23 33  ALT 35* 30 40*  ALKPHOS 61 64 60  PROT 6.8 7.2 7.2  ALBUMIN 4.4 4.8 4.7    Assessment and Plan: Patient familiar to IR service from consultation with Dr. Earleen Newport on 05/31/2022 to discuss treatment options for left renal mass. She  has a past medical history significant for anxiety, COPD, HTN, arthritis, asthma, fatty liver, GERD, diabetes, fibromyalgia, lupus and presents now with a 2.7- 3 cm mass in the upper pole of the left kidney suspicious for renal cell carcinoma.  Following discussions with Dr. Earleen Newport she was deemed an appropriate candidate for CT-guided cryoablation and possible biopsy of the mass and presents today for the procedure.  Details/risks of procedure, including but not limited to, internal bleeding, infection, injury to adjacent structures, anesthesia related complications discussed with patient with her understanding and consent.  This procedure involves the use of CT and because of the nature of the planned procedure, it is possible that we will have prolonged use of CT  Potential radiation risks to you include (but are not limited to) the following: - A slightly elevated risk for cancer  several years later in life. This risk is typically less than 0.5% percent. This risk is low in comparison to the normal incidence of human cancer, which is 33% for women and 50% for  men according to the [employer]. - Radiation induced injury can include skin redness, resembling a rash, tissue breakdown / ulcers and hair loss (which can be temporary or permanent).   The likelihood of either of these occurring depends on the difficulty of the procedure and whether you are sensitive to radiation due to previous procedures, disease, or genetic conditions.   IF your procedure requires a prolonged use of radiation, you will be notified and given written instructions for further action.  It is your responsibility to monitor the irradiated area for the 2 weeks following the procedure and to notify your physician if you are concerned that you have suffered a radiation induced injury.      Electronically Signed: D. Rowe Robert, PA-C 07/11/2022, 8:07 AM  I spent a total of 30 minutes at the the patient's bedside AND on the patient's hospital floor or unit, greater than 50% of which was counseling/coordinating care for CT/ultrasound-guided cryoablation and possible biopsy of left renal mass

## 2022-07-11 NOTE — Discharge Summary (Signed)
Patient ID: Tennile Styles MRN: [id number] DOB/AGE: 1956/03/06 66 y.o.  Admit date: 07/11/2022 Discharge date: 07/11/2022  Supervising Physician: Corrie Mckusick  Patient Status: [facility] OP  Admission Diagnoses: left renal mass  Discharge Diagnoses: Left renal mass, status post CT-guided biopsy and cryoablation on 07/11/2022   Active Problems:   * No active hospital problems. *  Past Medical History:  Diagnosis Date   Anxiety    Arthritis    Asthma    COPD (chronic obstructive pulmonary disease) (HCC)    Diabetes mellitus without complication (HCC)    Fibromyalgia    GERD (gastroesophageal reflux disease)    Hypertension    Peritonitis ([place])    Pneumonia    Past Surgical History:  Procedure Laterality Date   ABDOMINAL SURGERY     APPENDECTOMY     CHOLECYSTECTOMY     COLON SURGERY     IR RADIOLOGIST EVAL & MGMT  05/31/2022   JOINT REPLACEMENT Bilateral    knee   LEFT HEART CATH AND CORONARY ANGIOGRAPHY N/A 01/05/2020   Procedure: LEFT HEART CATH AND CORONARY ANGIOGRAPHY;  Surgeon: Martinique, Peter M, MD;  Location: [facility];  Service: Cardiovascular;  Laterality: N/A;   SMALL INTESTINE SURGERY     TONSILLECTOMY     TOTAL ABDOMINAL HYSTERECTOMY W/ BILATERAL SALPINGOOPHORECTOMY     complete     Discharged Condition: good  Hospital Course: Mrs. Pisani is a 66 year old female with past medical history of anxiety, COPD, hypertension, arthritis, asthma, fatty liver, GERD, diabetes, fibromyalgia, lupus as well as recently noted 2.7 to 3 cm upper pole left renal mass suspicious for renal cell carcinoma.  She underwent consultation with Dr. Earleen Newport on 05/31/2022 and was deemed an appropriate candidate for CT-guided biopsy and cryoablation of the renal mass.  On 07/11/2022 she underwent CT-guided biopsy and cryoablation of the left renal mass with general anesthesia.  The patient tolerated the procedure well with only mild volume postop hemorrhage from repositioning of the needles  noted on follow-up imaging.  She was subsequently extubated and transferred to PACU for observation.  She was seen in follow-up by Dr. Earleen Newport following her observation period and was deemed stable for discharge. She did have some mild nausea which was treated with Zofran as well as mild to moderate left lateral abdominal discomfort.  She was able to void and tolerate small amount of food without significant difficulty.  No hematuria. She did have a mild headache.  Vital signs were stable. She was instructed to resume home medications, stay well-hydrated and avoid strenuous activity for the next 3 to 4 days.  IR will follow up with patient in 2 to 3 weeks.  She  was told to contact our service with any additional questions.  Consults: anesthesia  Significant Diagnostic Studies:  Results for orders placed or performed during the hospital encounter of 07/11/22  Glucose, capillary  Result Value Ref Range   Glucose-Capillary 162 (H) 70 - 99 mg/dL  Glucose, capillary  Result Value Ref Range   Glucose-Capillary 232 (H) 70 - 99 mg/dL  ABO/Rh  Result Value Ref Range   ABO/RH(D)      O POS Performed at [facility] [address], [place], [place] [address]      Treatments: CT-guided biopsy and cryoablation of left renal mass via general anesthesia on 07/11/22  Discharge Exam: Blood pressure 115/78, pulse 70, temperature 98 F (36.7 C), resp. rate 14, height '5\' 8"'$  (1.727 m), weight 227 lb 15.3  oz (103.4 kg), SpO2 94 %. Awake, alert.  Chest clear to auscultation bilaterally.  Heart with regular rate and rhythm.  Abdomen soft, positive bowel sounds,  mildly tender left lateral abdominal region to palpation.  Puncture site left flank, clean, dry, not significantly tender.  No lower extremity edema.  Disposition: Discharge disposition: 01-Home or Self Care       Discharge Instructions     Call MD for:  difficulty breathing, headache or visual disturbances   Complete by: As  directed    Call MD for:  extreme fatigue   Complete by: As directed    Call MD for:  hives   Complete by: As directed    Call MD for:  persistant dizziness or light-headedness   Complete by: As directed    Call MD for:  persistant nausea and vomiting   Complete by: As directed    Call MD for:  redness, tenderness, or signs of infection (pain, swelling, redness, odor or green/yellow discharge around incision site)   Complete by: As directed    Call MD for:  severe uncontrolled pain   Complete by: As directed    Call MD for:  temperature >100.4   Complete by: As directed    Change dressing (specify)   Complete by: As directed    May change bandage over left flank daily for the next 2 to 3 days.  May wash site with soap and water.   Diet - low sodium heart healthy   Complete by: As directed    Discharge instructions   Complete by: As directed    May resume home medications, well-hydrated, avoid strenuous activity for the next 3 to 4 days.   Driving Restrictions   Complete by: As directed    No driving for the next 24 hours or after taking any narcotic medication   Increase activity slowly   Complete by: As directed    Lifting restrictions   Complete by: As directed    No heavy lifting for the next 3 to 4 days   May shower / Bathe   Complete by: As directed    May walk up steps   Complete by: As directed       Allergies as of 07/11/2022       Reactions   Claforan [cefotaxime] Anaphylaxis   Ibuprofen Anaphylaxis   Omnicef [cefdinir] Anaphylaxis   Penicillins Anaphylaxis   Has patient had a PCN reaction causing immediate rash, facial/tongue/throat swelling, SOB or lightheadedness with hypotension: Yes Has patient had a PCN reaction causing severe rash involving mucus membranes or skin necrosis: No Has patient had a PCN reaction that required hospitalization: Yes Has patient had a PCN reaction occurring within the last 10 years: Yes If all of the above answers are "NO",  then may proceed with Cephalosporin use.   Sulfa Antibiotics Anaphylaxis, Shortness Of Breath   Tetanus Toxoids Anaphylaxis   Voltaren [diclofenac Sodium] Anaphylaxis   Codeine Other (See Comments)   "Makes her crazy"   Diovan [valsartan] Swelling   Rocephin [ceftriaxone] Other (See Comments)   Eyes turn blood red    Statins    myalgias   Streptococcus (diplococcus) Pneumoniae [streptococci] Other (See Comments)   Unknown reaction (patient unfamiliar with this allergy)        Medication List     TAKE these medications    acetaminophen 500 MG tablet Commonly known as: TYLENOL Take 500-1,000 mg by mouth every 6 (six) hours as needed (pain.).  albuterol 108 (90 Base) MCG/ACT inhaler Commonly known as: VENTOLIN HFA TAKE 2 PUFFS BY MOUTH EVERY 6 HOURS AS NEEDED FOR WHEEZE OR SHORTNESS OF BREATH   amLODipine 5 MG tablet Commonly known as: NORVASC Take 1 tablet (5 mg total) by mouth daily.   aspirin EC 81 MG tablet Take 325 mg by mouth every evening.   EPINEPHrine 0.3 mg/0.3 mL Soaj injection Commonly known as: EPI-PEN Inject 0.3 mg into the muscle as needed for anaphylaxis.   ezetimibe 10 MG tablet Commonly known as: ZETIA Take 1 tablet (10 mg total) by mouth daily. What changed: when to take this   lisinopril 20 MG tablet Commonly known as: ZESTRIL Take 1 tablet (20 mg total) by mouth daily.   lisinopril-hydrochlorothiazide 20-25 MG tablet Commonly known as: ZESTORETIC Take 1 tablet by mouth daily.   Magnesium 250 MG Tabs Take 250 mg by mouth daily as needed (leg cramps.).   metFORMIN 1000 MG tablet Commonly known as: GLUCOPHAGE Take 1 tablet (1,000 mg total) by mouth 2 (two) times daily with a meal.   ondansetron 4 MG tablet Commonly known as: Zofran Take 1 tablet (4 mg total) by mouth every 8 (eight) hours as needed for nausea or vomiting.   OneTouch Verio test strip Generic drug: glucose blood USE TO CHECK BLOOD SUGAR ONCE DAILY                Discharge Care Instructions  (From admission, onward)           Start     Ordered   07/11/22 0000  Change dressing (specify)       Comments: May change bandage over left flank daily for the next 2 to 3 days.  May wash site with soap and water.   07/11/22 1636            Follow-up Information     Corrie Mckusick, DO Follow up.   Specialties: Interventional Radiology, Radiology Why: Radiology team will call you for follow-up with Dr. Earleen Newport in 2 to 3 weeks.  Call [phone] for any questions. Contact information: [place] [address] [phone]         Billey Co, MD Follow up.   Specialty: Urology Why: Follow-up with Dr.Sninsky as scheduled Contact information: [place] [address] [phone]                  Electronically Signed: D. Rowe Robert, PA-C 07/11/2022, 4:38 PM   I have spent Less Than 30 Minutes discharging Kenyatte Gruber.

## 2022-07-11 NOTE — Procedures (Signed)
Interventional Radiology Procedure Note  Procedure:  Image guided left renal tumor cryoablation, with biopsy.    Specimen:  3 x 18G core bx  EBL: none  Anesthesia: GETA with anesthesia team .  Complications: None  Recommendations:  - Stable to PACU - Advance diet - Routine dry dressings to left flank.  May be removed tomorrow after first shower - Do not submerge for 7 days - Once goals met today in PACU, may DC home.  Goals for DC: foley catheter out, +urination, no large volume hematuria, no N/V - follow up with Dr. Earleen Butler in clinic for post visit in ~4 weeks   Signed,  Meredith Butler. Meredith Newport, DO

## 2022-07-12 ENCOUNTER — Encounter (HOSPITAL_COMMUNITY): Payer: Self-pay | Admitting: Interventional Radiology

## 2022-07-12 LAB — SURGICAL PATHOLOGY

## 2022-07-12 NOTE — Anesthesia Postprocedure Evaluation (Signed)
Anesthesia Post Note  Patient: Meredith Butler  Procedure(s) Performed: Renal Cryo Ablation     Patient location during evaluation: PACU Anesthesia Type: General Level of consciousness: awake and alert Pain management: pain level controlled Vital Signs Assessment: post-procedure vital signs reviewed and stable Respiratory status: spontaneous breathing, nonlabored ventilation, respiratory function stable and patient connected to nasal cannula oxygen Cardiovascular status: blood pressure returned to baseline and stable Postop Assessment: no apparent nausea or vomiting Anesthetic complications: no   No notable events documented.  Last Vitals:  Vitals:   07/11/22 1530 07/11/22 1630  BP:  115/78  Pulse:  70  Resp:  14  Temp:    SpO2: (!) 89% 94%    Last Pain:  Vitals:   07/11/22 1630  TempSrc:   PainSc: 0-No pain                 Miryam Mcelhinney

## 2022-07-17 ENCOUNTER — Ambulatory Visit: Payer: Self-pay

## 2022-07-17 ENCOUNTER — Other Ambulatory Visit: Payer: Self-pay | Admitting: *Deleted

## 2022-07-17 MED ORDER — LISINOPRIL 20 MG PO TABS: 20.0000 mg | ORAL_TABLET | Freq: Every day | ORAL | 1 refills | Status: DC

## 2022-07-17 MED ORDER — LISINOPRIL-HYDROCHLOROTHIAZIDE 20-25 MG PO TABS
1.0000 | ORAL_TABLET | Freq: Every day | ORAL | 1 refills | Status: DC
Start: 2022-07-17 — End: 2022-11-01

## 2022-07-17 NOTE — Telephone Encounter (Signed)
Patient states she is unsure 

## 2022-07-17 NOTE — Telephone Encounter (Signed)
Does she need the lisinopril with HCTZ or just the Lisinopril or both?

## 2022-07-17 NOTE — Telephone Encounter (Signed)
Pt states pharm would not fill up to date refill. Will call pharm. Pt advised to wait a while and check in with pharm again.  Spoke with pharm and they stated they did not receive the Lisinopril rx. It does have a receipt received but they do not have the rx. Will send it back through. Answer Assessment - Initial Assessment Questions 1. REASON FOR CALL or QUESTION: "What is your reason for calling today?" or "How can I best help you?" or "What question do you have that I can help answer?"     Asking for a refill on Lisinopril '20mg'$ . Has rx with refill but pharm would not fill.  Protocols used: Information Only Call - No Triage-A-AH

## 2022-07-17 NOTE — Telephone Encounter (Signed)
Pharm states they did not receive this in June.  Requested Prescriptions  Pending Prescriptions Disp Refills  . lisinopril (ZESTRIL) 20 MG tablet 90 tablet 1    Sig: Take 1 tablet (20 mg total) by mouth daily.     Cardiovascular:  ACE Inhibitors Passed - 07/17/2022 12:52 PM      Passed - Cr in normal range and within 180 days    Creatinine, Ser  Date Value Ref Range Status  07/02/2022 0.95 0.44 - 1.00 mg/dL Final         Passed - K in normal range and within 180 days    Potassium  Date Value Ref Range Status  07/02/2022 4.3 3.5 - 5.1 mmol/L Final         Passed - Patient is not pregnant      Passed - Last BP in normal range    BP Readings from Last 1 Encounters:  07/11/22 115/78         Passed - Valid encounter within last 6 months    Recent Outpatient Visits          2 months ago Hypertension associated with diabetes ([place])   [facility] [name], NP   6 months ago Hypertension associated with diabetes [facility])   [facility] [name], NP   9 months ago Annual physical exam   [facility] [name], NP   10 months ago Jaw pain   [facility] McElwee, Lauren A, NP   1 year ago Hypertension associated with diabetes ([place])   [facility], Karen, NP      Future Appointments            In 3 months [name], NP [facility], [place]

## 2022-07-17 NOTE — Telephone Encounter (Signed)
Summary: discuss medication   Pt states pharmacy informed her lisinopril (ZESTRIL) 20 MG tablet was d/c   Pt inquiring why   Please assist further    Called pt - LMOMTCB

## 2022-07-17 NOTE — Telephone Encounter (Signed)
Both doses of Lisinopril sent to the pharmacy.  Will be a total of '40mg'$  of Lisinopril and '25mg'$  of HCTZ

## 2022-07-17 NOTE — Telephone Encounter (Signed)
Summary: discuss medication   Pt states pharmacy informed her lisinopril (ZESTRIL) 20 MG tablet was d/c   Pt inquiring why   Please assist further       Called pt - LMOMTCB

## 2022-07-17 NOTE — Telephone Encounter (Signed)
Summary: discuss medication   Pt states pharmacy informed her lisinopril (ZESTRIL) 20 MG tablet was d/c   Pt inquiring why   Please assist further     Called PT LMOMTCB  Unable to contact pt. Will forward to office for Follow up.

## 2022-07-17 NOTE — Addendum Note (Signed)
Addended by: [name] on: 07/17/2022 02:07 PM   Modules accepted: Orders

## 2022-07-18 ENCOUNTER — Telehealth: Payer: Self-pay | Admitting: Nurse Practitioner

## 2022-07-18 ENCOUNTER — Ambulatory Visit: Payer: Self-pay

## 2022-07-18 NOTE — Telephone Encounter (Addendum)
  Chief Complaint: pt wanting to know if abx that was prescribed by dentist hinder kidney surgery recovery. Pt has 2 total knee replacements and has a cleaning in the am. Pt stated she tried to call urologist but office was closed. Symptoms: none Frequency: n/a Pertinent Negatives: Patient denies n/a Disposition: '[]'$ ED /'[]'$ Urgent Care (no appt availability in office) / '[]'$ Appointment(In office/virtual)/ '[]'$  [place] [facility]/ '[]'$ Home Care/ '[]'$ Refused Recommended Disposition /'[]'$ [place] [facility]/ '[x]'$  Follow-up with PCP Additional Notes: pt also asked why she is on a total of Lisinopril 40 mg? Pt stated her [facility] pharmacist was asking why.  Called Dr. Shawna Clamp office and LM on clinical line. Reason for Disposition  [1] Caller has medicine question about med NOT prescribed by PCP AND [2] triager unable to answer question (e.g., compatibility with other med, storage)  Answer Assessment - Initial Assessment Questions 1. NAME of MEDICINE: "What medicine(s) are you calling about?"     Abx for dental cleaning in am prescribed by dentist 2. QUESTION: "What is your question?" (e.g., double dose of medicine, side effect)     Will that disturb or hinder kidney surgery recovery 3. PRESCRIBER: "Who prescribed the medicine?" Reason: if prescribed by specialist, call should be referred to that group.     dentist 4. SYMPTOMS: "Do you have any symptoms?" If Yes, ask: "What symptoms are you having?"  "How bad are the symptoms (e.g., mild, moderate, severe)     N/a 5. PREGNANCY:  "Is there any chance that you are pregnant?" "When was your last menstrual period?"     N/a  Protocols used: Medication Question Call-A-AH

## 2022-07-19 NOTE — Telephone Encounter (Signed)
Attempted to call patient. No answer. Unable to LVM.

## 2022-07-19 NOTE — Telephone Encounter (Signed)
I don't know what antibiotic she is taking so I can't advise on her kidney function.  Patient has been taking Lisinopril '40mg'$  since before 2020.  Likely because Lisinopril '20mg'$  was not enough to control her blood pressure.

## 2022-07-20 NOTE — Telephone Encounter (Signed)
Attemped to call patient. Patient did not answer. [place].   OK for PEC/Nurse Triage to give results for patient if she calls back.

## 2022-07-26 ENCOUNTER — Telehealth: Payer: Self-pay

## 2022-07-26 NOTE — Progress Notes (Signed)
Chronic Care Management Pharmacy Assistant   Name: Freddie Nghiem  MRN: [id number] DOB: [birth year]   Reason for Encounter: Disease State-General    Recent office visits:  05/02/22 [name], NP (HTN, Hyperlip, diabetes) Ordersplaced: Labs; Medication changes: Patient not taking clindomycin and nitrofurantion  Recent consult visits:  05/15/22 Billey Co, MD-Urology (Renal mass) Orders placed: Ambulatory referral to interventional radiology; Medication changes:  Hospital visits:  Medication Reconciliation was completed by comparing discharge summary, patient's EMR and Pharmacy list, and upon discussion with patient.  Admitted to the hospital on 07/11/22 due to left renal mass. Discharge date was 07/11/22. Discharged from [facility].   Medications that remain the same after Hospital Discharge:??  -All other medications will remain the same.    Medications: Outpatient Encounter Medications as of 07/26/2022  Medication Sig   acetaminophen (TYLENOL) 500 MG tablet Take 500-1,000 mg by mouth every 6 (six) hours as needed (pain.).   albuterol (VENTOLIN HFA) 108 (90 Base) MCG/ACT inhaler TAKE 2 PUFFS BY MOUTH EVERY 6 HOURS AS NEEDED FOR WHEEZE OR SHORTNESS OF BREATH   amLODipine (NORVASC) 5 MG tablet Take 1 tablet (5 mg total) by mouth daily.   aspirin EC 81 MG tablet Take 325 mg by mouth every evening.   EPINEPHrine 0.3 mg/0.3 mL IJ SOAJ injection Inject 0.3 mg into the muscle as needed for anaphylaxis.   ezetimibe (ZETIA) 10 MG tablet Take 1 tablet (10 mg total) by mouth daily. (Patient taking differently: Take 10 mg by mouth at bedtime.)   lisinopril (ZESTRIL) 20 MG tablet Take 1 tablet (20 mg total) by mouth daily.   lisinopril-hydrochlorothiazide (ZESTORETIC) 20-25 MG tablet Take 1 tablet by mouth daily.   Magnesium 250 MG TABS Take 250 mg by mouth daily as needed (leg cramps.).    metFORMIN (GLUCOPHAGE) 1000 MG tablet Take 1 tablet (1,000 mg  total) by mouth 2 (two) times daily with a meal.   ondansetron (ZOFRAN) 4 MG tablet Take 1 tablet (4 mg total) by mouth every 8 (eight) hours as needed for nausea or vomiting.   ONETOUCH VERIO test strip USE TO CHECK BLOOD SUGAR ONCE DAILY   No facility-administered encounter medications on file as of 07/26/2022.   [place] for General Review Call   Chart Review:  Have there been any documented [place], changed, or discontinued medications since last visit? No (If yes, include name, dose, frequency, date) Has there been any documented recent hospitalizations or ED visits since last visit with Clinical Pharmacist? Yes Brief Summary (including medication and/or Diagnosis changes):   Adherence Review:  Does the Clinical Pharmacist Assistant have access to adherence rates? Yes Adherence rates for STAR metric medications (List medication(s)/day supply/ last 2 fill dates). Adherence rates for medications indicated for disease state being reviewed (List medication(s)/day supply/ last 2 fill dates). Does the patient have >5 day gap between last estimated fill dates for any of the above medications or other medication gaps? No Reason for medication gaps.   Disease State Questions:  Able to connect with Patient? Yes  Did patient have any problems with their health recently? No Note problems and Concerns:  Have you had any admissions or emergency room visits or worsening of your condition(s) since last visit? Yes Details of ED visit, hospital visit and/or worsening condition(s):  Have you had any visits with [place] specialists or providers since your last visit? Yes Explain: Have you had any [place] health care problem(s) since your last visit? No [place]  problem(s) reported:  Have you run out of any of your medications since you last spoke with clinical pharmacist? No What caused you to run out of your medications?  Are there any medications you are not taking as prescribed? No What kept  you from taking your medications as prescribed?  Are you having any issues or side effects with your medications? No Note of issues or side effects:  Do you have any other health concerns or questions you want to discuss with your Clinical Pharmacist before your next visit? No Note additional concerns and questions from Patient.  Are there any health concerns that you feel we can do a better job addressing? No Note Patient's response.  Are you having any problems with any of the following since the last visit: (select all that apply)  None  Details:  12. Any falls since last visit? No  Details:  13. Any increased or uncontrolled pain since last visit? No  Details:  14. Next visit Type: office       Visit with:[facility]        Date:08/07/22        Time:10:30am  15. Additional Details? No    Care Gaps: Colonoscopy-(Patient Declined - only has half colon) Diabetic Foot Exam-10/17/21 Mammogram-03/08/22 Ophthalmology-07/15/20 Dexa Scan - Never done Annual Well Visit - 05/24/22 (Medicare) Micro albumin-NA Hemoglobin A1c- 07/02/22  Star Rating Drugs: Lisinopril 20 mg-last fill 07/17/22 90 ds, 05/02/22 90 ds Lisinopril-hctz 20-25 mg-last fill 07/08/22 90 ds, 05/01/22 90 ds  [place] [phone]

## 2022-08-07 ENCOUNTER — Ambulatory Visit: Payer: Self-pay

## 2022-08-07 ENCOUNTER — Ambulatory Visit
Admission: RE | Admit: 2022-08-07 | Discharge: 2022-08-07 | Disposition: A | Discharge status: Home / Self Care | Payer: Medicare Other | Source: Ambulatory Visit | Attending: Radiology | Admitting: Radiology

## 2022-08-07 DIAGNOSIS — N2889 Other specified disorders of kidney and ureter: Secondary | ICD-10-CM

## 2022-08-07 HISTORY — PX: IR RADIOLOGIST EVAL & MGMT: IMG5224

## 2022-08-07 NOTE — Progress Notes (Signed)
Chief Complaint: SP left RCC cryoablation  Referring Physician(s): Dr. Nickolas Madrid  History of Present Illness: Meredith Butler is a 66 y.o. female presenting as scheduled post-op follow up to [facility] clinic today, SP CT guided tissue ablation of left renal tumor, with biopsy performed at the same time.   Meredith Schubach joins [place] today by way of virtual visit, and her husband joins [place] on the phone.  We confirmed her identity with 2 personal identifiers.   We performed CT guided tissue ablation of the left kidney tumor with bx on 07/11/22, cryoablation technology.   Final Path 07/11/22:    She went home on the same day, and recovered fine.  She denies any [place] problems such as flank pain, hematuria, or other.  She says she has healed fine.    Past Medical History:  Diagnosis Date   Anxiety    Arthritis    Asthma    COPD (chronic obstructive pulmonary disease) (HCC)    Diabetes mellitus without complication (HCC)    Fibromyalgia    GERD (gastroesophageal reflux disease)    Hypertension    Peritonitis ([place])    Pneumonia     Past Surgical History:  Procedure Laterality Date   ABDOMINAL SURGERY     APPENDECTOMY     CHOLECYSTECTOMY     COLON SURGERY     IR RADIOLOGIST EVAL & MGMT  05/31/2022   JOINT REPLACEMENT Bilateral    knee   LEFT HEART CATH AND CORONARY ANGIOGRAPHY N/A 01/05/2020   Procedure: LEFT HEART CATH AND CORONARY ANGIOGRAPHY;  Surgeon: Martinique, Peter M, MD;  Location: [facility];  Service: Cardiovascular;  Laterality: N/A;   RADIOLOGY WITH ANESTHESIA N/A 07/11/2022   Procedure: Renal Cryo Ablation;  Surgeon: Corrie Mckusick, DO;  Location: [facility];  Service: Radiology;  Laterality: N/A;   SMALL INTESTINE SURGERY     TONSILLECTOMY     TOTAL ABDOMINAL HYSTERECTOMY W/ BILATERAL SALPINGOOPHORECTOMY     complete    Allergies: Claforan [cefotaxime], Ibuprofen, Omnicef [cefdinir], Penicillins, Sulfa antibiotics, Tetanus toxoids, Voltaren [diclofenac sodium], Codeine,  Diovan [valsartan], Rocephin [ceftriaxone], Statins, and Streptococcus (diplococcus) pneumoniae [streptococci]  Medications: Prior to Admission medications   Medication Sig Start Date End Date Taking? Authorizing Provider  acetaminophen (TYLENOL) 500 MG tablet Take 500-1,000 mg by mouth every 6 (six) hours as needed (pain.).    [provider]  albuterol (VENTOLIN HFA) 108 (90 Base) MCG/ACT inhaler TAKE 2 PUFFS BY MOUTH EVERY 6 HOURS AS NEEDED FOR WHEEZE OR SHORTNESS OF BREATH 06/25/22   [name], NP  amLODipine (NORVASC) 5 MG tablet Take 1 tablet (5 mg total) by mouth daily. 05/02/22   [name], NP  aspirin EC 81 MG tablet Take 325 mg by mouth every evening.    [provider]  EPINEPHrine 0.3 mg/0.3 mL IJ SOAJ injection Inject 0.3 mg into the muscle as needed for anaphylaxis. 05/02/22   [name], NP  ezetimibe (ZETIA) 10 MG tablet Take 1 tablet (10 mg total) by mouth daily. Patient taking differently: Take 10 mg by mouth at bedtime. 05/02/22   [name], NP  lisinopril (ZESTRIL) 20 MG tablet Take 1 tablet (20 mg total) by mouth daily. 07/17/22   [name], NP  lisinopril-hydrochlorothiazide (ZESTORETIC) 20-25 MG tablet Take 1 tablet by mouth daily. 07/17/22   [name], NP  Magnesium 250 MG TABS Take 250 mg by mouth daily as needed (leg cramps.).     [provider]  metFORMIN (GLUCOPHAGE)  1000 MG tablet Take 1 tablet (1,000 mg total) by mouth 2 (two) times daily with a meal. 05/02/22   [name], NP  ondansetron (ZOFRAN) 4 MG tablet Take 1 tablet (4 mg total) by mouth every 8 (eight) hours as needed for nausea or vomiting. 07/11/22   Allred, Shirlyn Goltz, PA-C  ONETOUCH VERIO test strip USE TO CHECK BLOOD SUGAR ONCE DAILY 05/02/22   [name], NP     Family History  Problem Relation Age of Onset   Heart attack Mother    Cancer Mother    Diabetes Mother    Heart attack Father    Diabetes Sister    Diabetes  Daughter    Cancer Maternal Grandmother        ovarian   Cancer Paternal Grandfather        colon   Cancer Sister        lung   Diabetes Daughter    Diabetes Maternal Aunt    Diabetes Maternal Uncle     Social History   Socioeconomic History   Marital status: Married    Spouse name: Not on file   Number of children: Not on file   Years of education: Not on file   Highest education level: Not on file  Occupational History   Not on file  Tobacco Use   Smoking status: Former    Packs/day: 2.00    Years: 30.00    Total pack years: 60.00    Types: Cigarettes    Quit date: 11/20/2009    Years since quitting: 12.7    Passive exposure: Past   Smokeless tobacco: Never  Vaping Use   Vaping Use: Never used  Substance and Sexual Activity   Alcohol use: No   Drug use: No   Sexual activity: Yes  Other Topics Concern   Not on file  Social History Narrative   Not on file   Social Determinants of Health   Financial Resource Strain: Low Risk  (05/24/2022)   Overall Financial Resource Strain (CARDIA)    Difficulty of Paying Living Expenses: Not hard at all  Food Insecurity: No Food Insecurity (05/24/2022)   Hunger Vital Sign    Worried About Running Out of Food in the Last Year: Never true    Ran Out of Food in the Last Year: Never true  Transportation Needs: No Transportation Needs (05/24/2022)   PRAPARE - Hydrologist (Medical): No    Lack of Transportation (Non-Medical): No  Physical Activity: Inactive (05/24/2022)   Exercise Vital Sign    Days of Exercise per Week: 0 days    Minutes of Exercise per Session: 0 min  Stress: No Stress Concern Present (05/24/2022)   [place]    Feeling of Stress : Not at all  Social Connections: Moderately Integrated (05/24/2022)   Social Connection and Isolation Panel [NHANES]    Frequency of Communication with Friends and Family: More than three times a  week    Frequency of Social Gatherings with Friends and Family: Once a week    Attends Religious Services: More than 4 times per year    Active Member of [employer] or Organizations: No    Attends Archivist Meetings: Never    Marital Status: Married       Review of Systems  Review of Systems: A 12 point ROS discussed and pertinent positives are indicated in the HPI above.  All other  systems are negative.  Advance Care Plan: The advanced care plan/surrogate decision maker was discussed at the time of visit and documented in the medical record.    Physical Exam No direct physical exam was performed (except for noted visual exam findings with Video Visits).    Vital Signs: There were no vitals taken for this visit.  Imaging: CT GUIDE TISSUE ABLATION  Result Date: 07/11/2022 INDICATION: 66 year old female with a left renal tumor, presumptive renal cell carcinoma, presents for biopsy and treatment EXAM: IMAGE GUIDED BIOPSY LEFT RENAL TUMOR IMAGE GUIDED ABLATION LEFT RENAL TUMOR COMPARISON:  MR 05/10/2022 MEDICATIONS: NONE ANESTHESIA/SEDATION: The anesthesia team was present to provide general endotracheal tube anesthesia and for patient monitoring during the procedure. Intubation was performed in [place]. Interventional radiology nursing staff was also present. FLUOROSCOPY: CT COMPLICATIONS: None TECHNIQUE: Informed written consent was obtained from the patient after a thorough discussion of the procedural risks, benefits and alternatives. All questions were addressed. Maximal Sterile Barrier Technique was utilized including caps, mask, sterile gowns, sterile gloves, sterile drape, hand hygiene and skin antiseptic. A timeout was performed prior to the initiation of the procedure. The patient was brought to the [place], and identity was verified. Anesthesia team was present for induction of anesthesia. Once the patient was intubated, she was position prone on the CT gantry  table. Scout CT was acquired for planning purposes. Ultrasound images were then performed. The patient was then prepped and draped in the usual sterile fashion. Ultrasound images were then used to place 3 fiducial [place] needles. CT was acquired. After the images were reviewed, we elected not to use ultrasound given the poor sonographic window, and we continued needle placement with CT guidance. The 3 fiducial needles were removed from the original positioning, and then placed with CT guidance from a more posterior approach. These [place] needle positions proved to be useful as a fiducial. Then using sequential CT images, 3 separate [place] Ire[place] 1.5 cryo probes were placed, positioning in a trident configuration. Contrast-enhanced CT was performed. A hydro dissection needle was placed between the tumor margin and the pancreas. After comfortable positioning in a trident configuration, approximately 150 cc of hydro dissection was instilled. Final CT images demonstrate adequate hydro dissection and safety margins and adequate positioning if the cryo probes. At this time, 3 separate 18 gauge core biopsy were acquired. We then proceeded with treatment, with a traditional 10 minute treatment, 8 minute passive thaw, and 10 minute treatment phase. After the first treatment, a CT was acquired. All 3 cryo probes were removed as well as the hydro dissection needle. A final CT was acquired. Patient remained hemodynamically stable throughout. No complications were encountered. No significant blood loss. The patient was transported at the completion to PACU for recovery. FINDINGS: Try dent configuration of 3 ice rod 1.5 cryo probes resulted in adequate ice ball covering the tumor site. No complicating features with small volume postoperative hemorrhage from repositioning of the needles. IMPRESSION: Status post image guided biopsy and treatment of left-sided renal tumor, presumably RCC, with cryoablation. Signed, Dulcy Fanny.  Nadene Rubins, RPVI [facility] Radiology Electronically Signed   By: Corrie Mckusick D.O.   On: 07/11/2022 14:59   CT BIOPSY  Result Date: 07/11/2022 INDICATION: 66 year old female with a left renal tumor, presumptive renal cell carcinoma, presents for biopsy and treatment EXAM: IMAGE GUIDED BIOPSY LEFT RENAL TUMOR IMAGE GUIDED ABLATION LEFT RENAL TUMOR COMPARISON:  MR 05/10/2022 MEDICATIONS: NONE ANESTHESIA/SEDATION: The  anesthesia team was present to provide general endotracheal tube anesthesia and for patient monitoring during the procedure. Intubation was performed in [place]. Interventional radiology nursing staff was also present. FLUOROSCOPY: CT COMPLICATIONS: None TECHNIQUE: Informed written consent was obtained from the patient after a thorough discussion of the procedural risks, benefits and alternatives. All questions were addressed. Maximal Sterile Barrier Technique was utilized including caps, mask, sterile gowns, sterile gloves, sterile drape, hand hygiene and skin antiseptic. A timeout was performed prior to the initiation of the procedure. The patient was brought to the [place], and identity was verified. Anesthesia team was present for induction of anesthesia. Once the patient was intubated, she was position prone on the CT gantry table. Scout CT was acquired for planning purposes. Ultrasound images were then performed. The patient was then prepped and draped in the usual sterile fashion. Ultrasound images were then used to place 3 fiducial [place] needles. CT was acquired. After the images were reviewed, we elected not to use ultrasound given the poor sonographic window, and we continued needle placement with CT guidance. The 3 fiducial needles were removed from the original positioning, and then placed with CT guidance from a more posterior approach. These [place] needle positions proved to be useful as a fiducial. Then  using sequential CT images, 3 separate Galil IreRod 1.5 cryo probes were placed, positioning in a trident configuration. Contrast-enhanced CT was performed. A hydro dissection needle was placed between the tumor margin and the pancreas. After comfortable positioning in a trident configuration, approximately 150 cc of hydro dissection was instilled. Final CT images demonstrate adequate hydro dissection and safety margins and adequate positioning if the cryo probes. At this time, 3 separate 18 gauge core biopsy were acquired. We then proceeded with treatment, with a traditional 10 minute treatment, 8 minute passive thaw, and 10 minute treatment phase. After the first treatment, a CT was acquired. All 3 cryo probes were removed as well as the hydro dissection needle. A final CT was acquired. Patient remained hemodynamically stable throughout. No complications were encountered. No significant blood loss. The patient was transported at the completion to PACU for recovery. FINDINGS: Try dent configuration of 3 ice rod 1.5 cryo probes resulted in adequate ice ball covering the tumor site. No complicating features with small volume postoperative hemorrhage from repositioning of the needles. IMPRESSION: Status post image guided biopsy and treatment of left-sided renal tumor, presumably RCC, with cryoablation. Signed, Dulcy Fanny. Nadene Rubins, RPVI [facility] Inter[facility] Radiology Special[facility] Radiology Electronically Signed   By: Corrie Mckusick D.O.   On: 07/11/2022 14:59    Labs:  CBC: Recent Labs    07/02/22 1352  WBC 5.6  HGB 14.0  HCT 41.6  PLT 286    COAGS: Recent Labs    07/02/22 1352  INR 1.0    BMP: Recent Labs    10/17/21 0927 01/08/22 1045 05/02/22 1107 07/02/22 1352  NA 140 139 139 137  K 4.1 4.3 4.7 4.3  CL 100 98 99 102  CO2 '25 26 21 26  '$ GLUCOSE 172* 130* 120* 114*  BUN '8 12 15 16  '$ CALCIUM 9.9 10.0 10.1 9.7  CREATININE 0.75 0.60 0.89 0.95  GFRNONAA  --   --   --   >60    LIVER FUNCTION TESTS: Recent Labs    10/17/21 0927 01/08/22 1045 05/02/22 1107  BILITOT 0.7 0.7 0.7  AST 27 23 33  ALT 35* 30 40*  ALKPHOS 61 64 60  PROT 6.8 7.2 7.2  ALBUMIN 4.4 4.8 4.7    TUMOR MARKERS: No results for input(s): "AFPTM", "CEA", "CA199", "CHROMGRNA" in the last 8760 hours.  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   Today we reviewed the pathology, and I assured her this is a sub-type that we would want to treat.  We discussed the next phase of care which is surveillance.  Our plan will be to get the first post-operative CT in about 3-4 months from now, which will be December/January time frame.  I also told her that we expect that the surveillance will be at least a few years as we watch the site of treatment, as well as the contralateral kidney.    She understands.   Plan: - Plan for first post-treatment contrast-enhanced CT abdomen in 3-4 months, with an office visit.  - Continue current care  Electronically Signed: Corrie Mckusick 08/07/2022, 10:49 AM   I spent a total of    15 Minutes in remote  clinical consultation, greater than 50% of which was counseling/coordinating care for post operative care, SP left RCC cryoablation.    Visit type: Audio only (telephone). Audio (no video) only due to patient's lack of internet/smartphone capability. Alternative for in-person consultation at [facility], [address], [place], [place]. This visit type was conducted due to national recommendations for restrictions regarding the COVID-19 Pandemic (e.g. social distancing).  This format is felt to be most appropriate for this patient at this time.  All issues noted in this document were discussed and addressed.

## 2022-08-07 NOTE — Telephone Encounter (Signed)
    Chief Complaint: Bilateral ear pain, worse on left. Facial swelling. Symptoms: Above Frequency: Last night Pertinent Negatives: Patient denies fever Disposition: '[]'$ ED /'[x]'$ Urgent Care (no appt availability in office) / '[]'$ Appointment(In office/virtual)/ '[]'$  [facility]/ '[]'$ Home Care/ '[]'$ Refused Recommended Disposition /'[]'$ [facility]/ '[]'$  Follow-up with PCP Additional Notes: Husband taking pt. To uc.  Reason for Disposition  Earache  (Exceptions: brief ear pain of < 60 minutes duration, earache occurring during air travel  Answer Assessment - Initial Assessment Questions 1. LOCATION: "Which ear is involved?"     Left ear worse 2. ONSET: "When did the ear start hurting"      Last night 3. SEVERITY: "How bad is the pain?"  (Scale 1-10; mild, moderate or severe)   - MILD (1-3): doesn't interfere with normal activities    - MODERATE (4-7): interferes with normal activities or awakens from sleep    - SEVERE (8-10): excruciating pain, unable to do any normal activities      5 4. URI SYMPTOMS: "Do you have a runny nose or cough?"     Cough 5. FEVER: "Do you have a fever?" If Yes, ask: "What is your temperature, how was it measured, and when did it start?"     No 6. CAUSE: "Have you been swimming recently?", "How often do you use Q-TIPS?", "Have you had any recent air travel or scuba diving?"     Unsure 7. OTHER SYMPTOMS: "Do you have any other symptoms?" (e.g., headache, stiff neck, dizziness, vomiting, runny nose, decreased hearing)     Facial swelling 8. PREGNANCY: "Is there any chance you are pregnant?" "When was your last menstrual period?"     No  Protocols used: Bethann Punches

## 2022-08-08 ENCOUNTER — Encounter: Payer: Self-pay | Admitting: Nurse Practitioner

## 2022-08-08 ENCOUNTER — Ambulatory Visit (INDEPENDENT_AMBULATORY_CARE_PROVIDER_SITE_OTHER): Payer: BC Managed Care – PPO | Admitting: Nurse Practitioner

## 2022-08-08 VITALS — BP 119/82 | HR 86 | Temp 98.3°F | Wt 227.2 lb

## 2022-08-08 DIAGNOSIS — K047 Periapical abscess without sinus: Secondary | ICD-10-CM | POA: Diagnosis not present

## 2022-08-08 DIAGNOSIS — R300 Dysuria: Secondary | ICD-10-CM

## 2022-08-08 DIAGNOSIS — N3001 Acute cystitis with hematuria: Secondary | ICD-10-CM

## 2022-08-08 LAB — URINALYSIS, ROUTINE W REFLEX MICROSCOPIC
Bilirubin, UA: NEGATIVE
Glucose, UA: NEGATIVE
Ketones, UA: NEGATIVE
Nitrite, UA: NEGATIVE
Specific Gravity, UA: 1.02 (ref 1.005–1.030)
Urobilinogen, Ur: 0.2 mg/dL (ref 0.2–1.0)
pH, UA: 5.5 (ref 5.0–7.5)

## 2022-08-08 LAB — MICROSCOPIC EXAMINATION: Bacteria, UA: NONE SEEN

## 2022-08-08 MED ORDER — CLINDAMYCIN HCL 300 MG PO CAPS: 300.0000 mg | ORAL_CAPSULE | Freq: Two times a day (BID) | ORAL | 0 refills | Status: DC

## 2022-08-08 NOTE — Progress Notes (Signed)
Results discussed with patient during visit.

## 2022-08-08 NOTE — Progress Notes (Signed)
BP 119/82   Pulse 86   Temp 98.3 F (36.8 C) (Oral)   Wt 227 lb 3.2 oz (103.1 kg)   SpO2 98%   BMI 34.55 kg/m    Subjective:    Patient ID: Meredith Butler, female    DOB: 1956-09-24, 66 y.o.   MRN: [id number]  HPI: Meredith Butler is a 66 y.o. female  Chief Complaint  Patient presents with   Dysuria     Ongoing x 2-3 days. OTC generic Azo- no relief. Denies hematuria. Reports urgency and frequency to urinate. Pt reports she was rx z pak and cream for ?yeast infection.   Facial Swelling      L side of face swelling. Pt reports mass like sensation, associated with ear pain.    URINARY SYMPTOMS Started 2-3 days ago Dysuria: yes Urinary frequency: yes Urgency: yes Small volume voids: yes Symptom severity: no Urinary incontinence: no Foul odor: yes Hematuria: no Abdominal pain: no Back pain: no Suprapubic pain/pressure: yes Flank pain: no Fever:  no Vomiting: no Relief with cranberry juice: no Relief with pyridium: no Status: worse Previous urinary tract infection: no Recurrent urinary tract infection: no Tried AZO but it didn't help Treatments attempted:  AZO    Patient had a tooth pulled on 07/20/22.  Swelling on left side of face in front of ear swelled started 2 days ago.  It is painful, sometimes has trouble swallowing, when she eats she feels like it gets bigger.  Ice and heat have improved the pain.  Denies fever.  Relevant past medical, surgical, family and social history reviewed and updated as indicated. Interim medical history since our last visit reviewed. Allergies and medications reviewed and updated.  Review of Systems  Constitutional:  Negative for fever.  HENT:  Positive for facial swelling.   Gastrointestinal:  Negative for abdominal pain and vomiting.  Genitourinary:  Positive for decreased urine volume, dysuria, frequency and urgency. Negative for flank pain and hematuria.       Pelvic pressure  Musculoskeletal:  Negative for back pain.    Per HPI  unless specifically indicated above     Objective:    BP 119/82   Pulse 86   Temp 98.3 F (36.8 C) (Oral)   Wt 227 lb 3.2 oz (103.1 kg)   SpO2 98%   BMI 34.55 kg/m   Wt Readings from Last 3 Encounters:  08/08/22 227 lb 3.2 oz (103.1 kg)  07/11/22 227 lb 15.3 oz (103.4 kg)  07/02/22 228 lb (103.4 kg)    Physical Exam Vitals and nursing note reviewed.  Constitutional:      General: She is not in acute distress.    Appearance: Normal appearance. She is normal weight. She is not ill-appearing, toxic-appearing or diaphoretic.  HENT:     Head: Normocephalic.      Right Ear: External ear normal.     Left Ear: External ear normal. Tenderness present. Tympanic membrane is erythematous. Tympanic membrane is not perforated, retracted or bulging.     Nose: Nose normal.     Mouth/Throat:     Mouth: Mucous membranes are moist.     Dentition: Dental tenderness present.     Pharynx: Oropharynx is clear.     Tonsils: No tonsillar exudate or tonsillar abscesses.  Eyes:     General:        Right eye: No discharge.        Left eye: No discharge.     Extraocular Movements: Extraocular movements intact.  Conjunctiva/sclera: Conjunctivae normal.     Pupils: Pupils are equal, round, and reactive to light.  Cardiovascular:     Rate and Rhythm: Normal rate and regular rhythm.     Heart sounds: No murmur heard. Pulmonary:     Effort: Pulmonary effort is normal. No respiratory distress.     Breath sounds: Normal breath sounds. No wheezing or rales.  Musculoskeletal:     Cervical back: Normal range of motion and neck supple.  Skin:    General: Skin is warm and dry.     Capillary Refill: Capillary refill takes less than 2 seconds.  Neurological:     General: No focal deficit present.     Mental Status: She is alert and oriented to person, place, and time. Mental status is at baseline.  Psychiatric:        Mood and Affect: Mood normal.        Behavior: Behavior normal.        Thought  Content: Thought content normal.        Judgment: Judgment normal.     Results for orders placed or performed in visit on 08/08/22  Microscopic Examination   Urine  Result Value Ref Range   WBC, UA 11-30 (A) 0 - 5 /hpf   RBC, Urine 0-2 0 - 2 /hpf   Epithelial Cells (non renal) 0-10 0 - 10 /hpf   Bacteria, UA None seen None seen/Few  Urinalysis, Routine w reflex microscopic  Result Value Ref Range   Specific Gravity, UA 1.020 1.005 - 1.030   pH, UA 5.5 5.0 - 7.5   Color, UA Yellow Yellow   Appearance Ur Clear Clear   Leukocytes,UA 1+ (A) Negative   Protein,UA 2+ (A) Negative/Trace   Glucose, UA Negative Negative   Ketones, UA Negative Negative   RBC, UA 2+ (A) Negative   Bilirubin, UA Negative Negative   Urobilinogen, Ur 0.2 0.2 - 1.0 mg/dL   Nitrite, UA Negative Negative   Microscopic Examination See below:       Assessment & Plan:   Problem List Items Addressed This Visit   None Visit Diagnoses     Acute cystitis with hematuria    -  Primary   Will treat with Clindamycin due to allergies. Complete course of antibiotics. Follow up if symptoms not improved.     Dental infection       Will treat with Clindamycin. Complete course of antibiotics. Follow up in 1 week. If not improved, will order [place] to evaluate area.    Relevant Medications   fluconazole (DIFLUCAN) 200 MG tablet   nystatin cream (MYCOSTATIN)   clindamycin (CLEOCIN) 300 MG capsule   Dysuria       Relevant Orders   Urinalysis, Routine w reflex microscopic (Completed)        Follow up plan: Return in about 1 week (around 08/15/2022) for facial swelling.

## 2022-08-14 NOTE — Progress Notes (Deleted)
There were no vitals taken for this visit.   Subjective:    Patient ID: Meredith Butler, female    DOB: 14-Apr-1956, 66 y.o.   MRN: [id number]  HPI: Meredith Butler is a 66 y.o. female  No chief complaint on file.  Patient had a tooth pulled on 07/20/22.  Swelling on left side of face in front of ear swelled started 2 days ago.  It is painful, sometimes has trouble swallowing, when she eats she feels like it gets bigger.  Ice and heat have improved the pain.  Denies fever.  Relevant past medical, surgical, family and social history reviewed and updated as indicated. Interim medical history since our last visit reviewed. Allergies and medications reviewed and updated.  Review of Systems  Constitutional:  Negative for fever.  HENT:  Positive for facial swelling.   Gastrointestinal:  Negative for abdominal pain and vomiting.  Genitourinary:  Positive for decreased urine volume, dysuria, frequency and urgency. Negative for flank pain and hematuria.       Pelvic pressure  Musculoskeletal:  Negative for back pain.    Per HPI unless specifically indicated above     Objective:    There were no vitals taken for this visit.  Wt Readings from Last 3 Encounters:  08/08/22 227 lb 3.2 oz (103.1 kg)  07/11/22 227 lb 15.3 oz (103.4 kg)  07/02/22 228 lb (103.4 kg)    Physical Exam Vitals and nursing note reviewed.  Constitutional:      General: She is not in acute distress.    Appearance: Normal appearance. She is normal weight. She is not ill-appearing, toxic-appearing or diaphoretic.  HENT:     Head: Normocephalic.      Right Ear: External ear normal.     Left Ear: External ear normal. Tenderness present. Tympanic membrane is erythematous. Tympanic membrane is not perforated, retracted or bulging.     Nose: Nose normal.     Mouth/Throat:     Mouth: Mucous membranes are moist.     Dentition: Dental tenderness present.     Pharynx: Oropharynx is clear.     Tonsils: No tonsillar exudate or  tonsillar abscesses.  Eyes:     General:        Right eye: No discharge.        Left eye: No discharge.     Extraocular Movements: Extraocular movements intact.     Conjunctiva/sclera: Conjunctivae normal.     Pupils: Pupils are equal, round, and reactive to light.  Cardiovascular:     Rate and Rhythm: Normal rate and regular rhythm.     Heart sounds: No murmur heard. Pulmonary:     Effort: Pulmonary effort is normal. No respiratory distress.     Breath sounds: Normal breath sounds. No wheezing or rales.  Musculoskeletal:     Cervical back: Normal range of motion and neck supple.  Skin:    General: Skin is warm and dry.     Capillary Refill: Capillary refill takes less than 2 seconds.  Neurological:     General: No focal deficit present.     Mental Status: She is alert and oriented to person, place, and time. Mental status is at baseline.  Psychiatric:        Mood and Affect: Mood normal.        Behavior: Behavior normal.        Thought Content: Thought content normal.        Judgment: Judgment normal.     Results  for orders placed or performed in visit on 08/08/22  Microscopic Examination   Urine  Result Value Ref Range   WBC, UA 11-30 (A) 0 - 5 /hpf   RBC, Urine 0-2 0 - 2 /hpf   Epithelial Cells (non renal) 0-10 0 - 10 /hpf   Bacteria, UA None seen None seen/Few  Urinalysis, Routine w reflex microscopic  Result Value Ref Range   Specific Gravity, UA 1.020 1.005 - 1.030   pH, UA 5.5 5.0 - 7.5   Color, UA Yellow Yellow   Appearance Ur Clear Clear   Leukocytes,UA 1+ (A) Negative   Protein,UA 2+ (A) Negative/Trace   Glucose, UA Negative Negative   Ketones, UA Negative Negative   RBC, UA 2+ (A) Negative   Bilirubin, UA Negative Negative   Urobilinogen, Ur 0.2 0.2 - 1.0 mg/dL   Nitrite, UA Negative Negative   Microscopic Examination See below:       Assessment & Plan:   Problem List Items Addressed This Visit   None    Follow up plan: No follow-ups on  file.

## 2022-08-15 ENCOUNTER — Ambulatory Visit: Payer: Medicare Other | Admitting: Nurse Practitioner

## 2022-10-01 ENCOUNTER — Other Ambulatory Visit: Payer: Self-pay | Admitting: Interventional Radiology

## 2022-10-01 DIAGNOSIS — N2889 Other specified disorders of kidney and ureter: Secondary | ICD-10-CM

## 2022-10-13 ENCOUNTER — Other Ambulatory Visit: Payer: Self-pay | Admitting: Nurse Practitioner

## 2022-10-16 NOTE — Telephone Encounter (Signed)
Requested Prescriptions  Pending Prescriptions Disp Refills   amLODipine (NORVASC) 5 MG tablet [Pharmacy Med Name: AMLODIPINE BESYLATE 5 MG TAB] 90 tablet 1    Sig: TAKE 1 TABLET (5 MG TOTAL) BY MOUTH DAILY.     Cardiovascular: Calcium Channel Blockers 2 Passed - 10/13/2022  8:39 AM      Passed - Last BP in normal range    BP Readings from Last 1 Encounters:  08/08/22 119/82         Passed - Last Heart Rate in normal range    Pulse Readings from Last 1 Encounters:  08/08/22 86         Passed - Valid encounter within last 6 months    Recent Outpatient Visits           2 months ago Acute cystitis with hematuria   [facility] [name], NP   5 months ago Hypertension associated with diabetes [facility])   [facility] [name], NP   9 months ago Hypertension associated with diabetes ([place])   [facility] [name], NP   12 months ago Annual physical exam   [facility] [name], NP   1 year ago Jaw pain   [place], Scheryl Darter, NP       Future Appointments             In 2 weeks [name], NP [place]             metFORMIN (GLUCOPHAGE) 1000 MG tablet [Pharmacy Med Name: METFORMIN HCL 1,000 MG TABLET] 180 tablet 1    Sig: TAKE 1 TABLET (1,000 MG TOTAL) BY MOUTH TWICE A DAY WITH FOOD     Endocrinology:  Diabetes - Biguanides Passed - 10/13/2022  8:39 AM      Passed - Cr in normal range and within 360 days    Creatinine, Ser  Date Value Ref Range Status  07/02/2022 0.95 0.44 - 1.00 mg/dL Final         Passed - HBA1C is between 0 and 7.9 and within 180 days    HB A1C (BAYER DCA - WAIVED)  Date Value Ref Range Status  12/13/2020 6.8 <7.0 % Final    Comment:                                          Diabetic Adult            <7.0                                       Healthy Adult        4.3 - 5.7                                                            (DCCT/NGSP) American Diabetes Association's Summary of Glycemic Recommendations for Adults with Diabetes: Hemoglobin A1c <7.0%. More stringent glycemic goals (A1c <6.0%) may further reduce complications at the cost of increased risk of hypoglycemia.    Hgb A1c MFr Bld  Date Value Ref Range Status  07/02/2022 7.3 (H) 4.8 - 5.6 % Final    Comment:    (NOTE) Pre diabetes:          5.7%-6.4%  Diabetes:              >6.4%  Glycemic control for   <7.0% adults with diabetes          Passed - eGFR in normal range and within 360 days    GFR calc Af Amer  Date Value Ref Range Status  12/13/2020 80 >59 mL/min/1.73 Final    Comment:    **In accordance with recommendations from the NKF-ASN Task force,**   [facility] is in the process of updating its eGFR calculation to the   2021 CKD-EPI creatinine equation that estimates kidney function   without a race variable.    GFR, Estimated  Date Value Ref Range Status  07/02/2022 >60 >60 mL/min Final    Comment:    (NOTE) Calculated using the CKD-EPI Creatinine Equation (2021)    eGFR  Date Value Ref Range Status  05/02/2022 71 >59 mL/min/1.73 Final         Passed - B12 Level in normal range and within 720 days    Vitamin B-12  Date Value Ref Range Status  12/13/2020 462 232 - 1,245 pg/mL Final         Passed - Valid encounter within last 6 months    Recent Outpatient Visits           2 months ago Acute cystitis with hematuria   [facility] [name], NP   5 months ago Hypertension associated with diabetes ([place])   [facility] [name], NP   9 months ago Hypertension associated with diabetes ([place])   [facility] [name], NP   12 months ago Annual physical exam   [facility] [name], NP   1 year ago Jaw pain   [place], Lauren A, NP       Future Appointments             In 2 weeks [name],  NP [place]            Passed - CBC within normal limits and completed in the last 12 months    WBC  Date Value Ref Range Status  07/02/2022 5.6 4.0 - 10.5 K/uL Final   RBC  Date Value Ref Range Status  07/02/2022 4.52 3.87 - 5.11 MIL/uL Final   Hemoglobin  Date Value Ref Range Status  07/02/2022 14.0 12.0 - 15.0 g/dL Final  12/28/2019 14.9 11.1 - 15.9 g/dL Final   HCT  Date Value Ref Range Status  07/02/2022 41.6 36.0 - 46.0 % Final   Hematocrit  Date Value Ref Range Status  12/28/2019 44.2 34.0 - 46.6 % Final   MCHC  Date Value Ref Range Status  07/02/2022 33.7 30.0 - 36.0 g/dL Final   [facility]  Date Value Ref Range Status  07/02/2022 31.0 26.0 - 34.0 pg Final   MCV  Date Value Ref Range Status  07/02/2022 92.0 80.0 - 100.0 fL Final  12/28/2019 90 79 - 97 fL Final   No results found for: "PLTCOUNTKUC", "LABPLAT", "POCPLA" RDW  Date Value Ref Range Status  07/02/2022 12.3 11.5 - 15.5 % Final  12/28/2019 12.5 11.7 - 15.4 % Final

## 2022-10-22 LAB — COLOGUARD: Cologuard: NEGATIVE

## 2022-10-23 ENCOUNTER — Ambulatory Visit
Admission: RE | Admit: 2022-10-23 | Discharge: 2022-10-23 | Disposition: A | Discharge status: Home / Self Care | Payer: BC Managed Care – PPO | Source: Ambulatory Visit | Attending: Interventional Radiology | Admitting: Interventional Radiology

## 2022-10-23 DIAGNOSIS — N2889 Other specified disorders of kidney and ureter: Secondary | ICD-10-CM | POA: Diagnosis present

## 2022-10-23 LAB — POCT I-STAT CREATININE: Creatinine, Ser: 0.7 mg/dL (ref 0.44–1.00)

## 2022-10-23 MED ORDER — IOHEXOL 300 MG/ML  SOLN
100.0000 mL | Freq: Once | INTRAMUSCULAR | Status: AC | PRN
  Administered 2022-10-23: 100 mL via INTRAVENOUS

## 2022-10-25 ENCOUNTER — Telehealth: Payer: Self-pay

## 2022-10-25 NOTE — Progress Notes (Signed)
Chronic Care Management Pharmacy Assistant   Name: Meredith Butler  MRN: [id number] DOB: April 27, 1956   Reason for Encounter: Disease State    Recent office visits:  08/08/22 [name], NP (Acute cystitis with hematuria) Orders placed: microscopic examination, urinalysis, routine w reflex microscopic; Medication changes: Clindamycin 300 mg  Recent consult visits:  None since last coordination call 08/08/22  Hospital visits:  None since last coordination call 08/08/22   Medications: Outpatient Encounter Medications as of 10/25/2022  Medication Sig   albuterol (VENTOLIN HFA) 108 (90 Base) MCG/ACT inhaler TAKE 2 PUFFS BY MOUTH EVERY 6 HOURS AS NEEDED FOR WHEEZE OR SHORTNESS OF BREATH   amLODipine (NORVASC) 5 MG tablet TAKE 1 TABLET (5 MG TOTAL) BY MOUTH DAILY.   aspirin EC 81 MG tablet Take 325 mg by mouth every evening.   clindamycin (CLEOCIN) 300 MG capsule Take 1 capsule (300 mg total) by mouth in the morning and at bedtime.   EPINEPHrine 0.3 mg/0.3 mL IJ SOAJ injection Inject 0.3 mg into the muscle as needed for anaphylaxis.   ezetimibe (ZETIA) 10 MG tablet Take 1 tablet (10 mg total) by mouth daily. (Patient taking differently: Take 10 mg by mouth at bedtime.)   fluconazole (DIFLUCAN) 200 MG tablet Take 200 mg by mouth daily.   lisinopril (ZESTRIL) 20 MG tablet Take 1 tablet (20 mg total) by mouth daily.   lisinopril-hydrochlorothiazide (ZESTORETIC) 20-25 MG tablet Take 1 tablet by mouth daily.   Magnesium 250 MG TABS Take 250 mg by mouth daily as needed (leg cramps.).    metFORMIN (GLUCOPHAGE) 1000 MG tablet TAKE 1 TABLET (1,000 MG TOTAL) BY MOUTH TWICE A DAY WITH FOOD   nystatin cream (MYCOSTATIN) Apply 1 Application topically 2 (two) times daily.   ONETOUCH VERIO test strip USE TO CHECK BLOOD SUGAR ONCE DAILY   No facility-administered encounter medications on file as of 10/25/2022.   [place] for General Review Call  Unsuccessful outbound call made today to  assist with: General Assessment   Outreach Attempt:  3rd Attempt, to reach patient.  A HIPAA compliant voice message was left requesting a return call.  Instructed patient to call back at  earliest convenience.  Chart Review:  Have there been any documented [place], changed, or discontinued medications since last visit? Yes (If yes, include name, dose, frequency, date) Has there been any documented recent hospitalizations or ED visits since last visit with Clinical Pharmacist? No Brief Summary (including medication and/or Diagnosis changes):   Adherence Review:  Does the Clinical Pharmacist Assistant have access to adherence rates? Yes Adherence rates for STAR metric medications (List medication(s)/day supply/ last 2 fill dates). Adherence rates for medications indicated for disease state being reviewed (List medication(s)/day supply/ last 2 fill dates). Does the patient have >5 day gap between last estimated fill dates for any of the above medications or other medication gaps? No Reason for medication gaps. Do you receive your medications through PAP? No  If Yes, what is the status? Last received?    14. Next visit Type: telephone       Visit with:Clinical Pharmacist        Date:12/31/22        Time:1 pm  15. Additional Details? No    Care Gaps: Colonoscopy-Not needed Diabetic Foot Exam-10/17/21 Mammogram-03/08/22 Ophthalmology-07/15/20 Dexa Scan - NA Annual Well Visit - 05/24/22 Micro albumin-11/01/22 Hemoglobin A1c- 11/01/22 (8.4) 07/02/22 (7.3)  Star Rating Drugs: Lisinopril 20 mg-LF 09/23/22 90 ds, 07/17/22 90 ds Metformin 1000 mg-LF  10/16/22 90 ds, 07/18/22 90 ds  [place] [phone]

## 2022-10-29 LAB — COLOGUARD: COLOGUARD: NEGATIVE

## 2022-10-31 NOTE — Progress Notes (Unsigned)
There were no vitals taken for this visit.   Subjective:    Patient ID: Meredith Butler, female    DOB: Sep 04, 1956, 66 y.o.   MRN: [id number]  HPI: Meredith Butler is a 66 y.o. female  No chief complaint on file.  HYPERTENSION Hypertension status: controlled  Satisfied with current treatment? yes Duration of hypertension: years BP monitoring frequency:  daily BP range: 121/81 BP medication side effects:  no Medication compliance: excellent compliance Previous BP meds:amlodipine, lisinopril, and lisinopril-HCTZ Aspirin: yes Recurrent headaches: yes Visual changes: no Palpitations: no Dyspnea: yes Chest pain: yes Lower extremity edema: no Dizzy/lightheaded: no  DIABETES Hypoglycemic episodes:no Polydipsia/polyuria: no Visual disturbance: no Chest pain: no Paresthesias: no Glucose Monitoring: yes  Accucheck frequency: Daily  Fasting glucose: 139  Post prandial:  Evening:  Before meals: Taking Insulin?: no  Long acting insulin:  Short acting insulin: Blood Pressure Monitoring: daily Retinal Examination: Up to Date Foot Exam: Up to Date Diabetic Education: Not Completed Pneumovax: Not up to Date Influenza: Not up to Date Aspirin: no  COPD COPD status: controlled Satisfied with current treatment?: yes Oxygen use: no Dyspnea frequency: once daily or 1x every two days Cough frequency: sometimes Rescue inhaler frequency:  once every other day Limitation of activity: yes Productive cough: Sometimes  Last Spirometry:  Pneumovax: Not up to Date Influenza: Not up to Date  Relevant past medical, surgical, family and social history reviewed and updated as indicated. Interim medical history since our last visit reviewed. Allergies and medications reviewed and updated.  Review of Systems  Eyes:  Negative for visual disturbance.  Respiratory:  Negative for chest tightness and shortness of breath.   Cardiovascular:  Negative for chest pain, palpitations and leg swelling.   Endocrine: Negative for polydipsia and polyuria.  Neurological:  Negative for dizziness, light-headedness, numbness and headaches.    Per HPI unless specifically indicated above     Objective:    There were no vitals taken for this visit.  Wt Readings from Last 3 Encounters:  08/08/22 227 lb 3.2 oz (103.1 kg)  07/11/22 227 lb 15.3 oz (103.4 kg)  07/02/22 228 lb (103.4 kg)    Physical Exam Vitals and nursing note reviewed.  Constitutional:      General: She is not in acute distress.    Appearance: Normal appearance. She is normal weight. She is not ill-appearing, toxic-appearing or diaphoretic.  HENT:     Head: Normocephalic.     Right Ear: External ear normal.     Left Ear: External ear normal.     Nose: Nose normal.     Mouth/Throat:     Mouth: Mucous membranes are moist.     Pharynx: Oropharynx is clear.  Eyes:     General:        Right eye: No discharge.        Left eye: No discharge.     Extraocular Movements: Extraocular movements intact.     Conjunctiva/sclera: Conjunctivae normal.     Pupils: Pupils are equal, round, and reactive to light.  Cardiovascular:     Rate and Rhythm: Normal rate and regular rhythm.     Heart sounds: No murmur heard. Pulmonary:     Effort: Pulmonary effort is normal. No respiratory distress.     Breath sounds: Normal breath sounds. No wheezing or rales.  Musculoskeletal:     Cervical back: Normal range of motion and neck supple.  Skin:    General: Skin is warm and dry.  Capillary Refill: Capillary refill takes less than 2 seconds.  Neurological:     General: No focal deficit present.     Mental Status: She is alert and oriented to person, place, and time. Mental status is at baseline.  Psychiatric:        Mood and Affect: Mood normal.        Behavior: Behavior normal.        Thought Content: Thought content normal.        Judgment: Judgment normal.     Results for orders placed or performed during the hospital encounter of  10/23/22  I-STAT creatinine  Result Value Ref Range   Creatinine, Ser 0.70 0.44 - 1.00 mg/dL      Assessment & Plan:   Problem List Items Addressed This Visit       Cardiovascular and Mediastinum   Aortic atherosclerosis ([place])   Hypertension associated with diabetes ([place])     Respiratory   Centrilobular emphysema ([place])     Endocrine   Type 2 diabetes mellitus with morbid obesity ([place])   Hyperlipidemia associated with type 2 diabetes mellitus ([place])     Other   Morbid obesity ([place])   Depression, recurrent ([place]) - Primary     Follow up plan: No follow-ups on file.

## 2022-11-01 ENCOUNTER — Encounter: Payer: Self-pay | Admitting: Nurse Practitioner

## 2022-11-01 ENCOUNTER — Ambulatory Visit (INDEPENDENT_AMBULATORY_CARE_PROVIDER_SITE_OTHER): Payer: BC Managed Care – PPO | Admitting: Nurse Practitioner

## 2022-11-01 VITALS — BP 125/79 | HR 82 | Temp 97.8°F | Wt 230.8 lb

## 2022-11-01 DIAGNOSIS — E1159 Type 2 diabetes mellitus with other circulatory complications: Secondary | ICD-10-CM

## 2022-11-01 DIAGNOSIS — J432 Centrilobular emphysema: Secondary | ICD-10-CM

## 2022-11-01 DIAGNOSIS — F339 Major depressive disorder, recurrent, unspecified: Secondary | ICD-10-CM

## 2022-11-01 DIAGNOSIS — Z1159 Encounter for screening for other viral diseases: Secondary | ICD-10-CM

## 2022-11-01 DIAGNOSIS — H9193 Unspecified hearing loss, bilateral: Secondary | ICD-10-CM

## 2022-11-01 DIAGNOSIS — M791 Myalgia, unspecified site: Secondary | ICD-10-CM

## 2022-11-01 DIAGNOSIS — T466X5A Adverse effect of antihyperlipidemic and antiarteriosclerotic drugs, initial encounter: Secondary | ICD-10-CM

## 2022-11-01 DIAGNOSIS — I700 Atherosclerosis of aorta: Secondary | ICD-10-CM

## 2022-11-01 DIAGNOSIS — I152 Hypertension secondary to endocrine disorders: Secondary | ICD-10-CM

## 2022-11-01 DIAGNOSIS — E785 Hyperlipidemia, unspecified: Secondary | ICD-10-CM

## 2022-11-01 DIAGNOSIS — E1169 Type 2 diabetes mellitus with other specified complication: Secondary | ICD-10-CM

## 2022-11-01 LAB — MICROALBUMIN, URINE WAIVED
Creatinine, Urine Waived: 50 mg/dL (ref 10–300)
Microalb, Ur Waived: 10 mg/L (ref 0–19)

## 2022-11-01 MED ORDER — LISINOPRIL-HYDROCHLOROTHIAZIDE 20-25 MG PO TABS: 1.0000 | ORAL_TABLET | Freq: Every day | ORAL | 1 refills | Status: DC

## 2022-11-01 MED ORDER — DEXCOM G7 RECEIVER DEVI: 1.0000 [IU] | Freq: Once | 0 refills | Status: AC

## 2022-11-01 MED ORDER — METFORMIN HCL 1000 MG PO TABS: ORAL_TABLET | ORAL | 1 refills | Status: DC

## 2022-11-01 MED ORDER — ALBUTEROL SULFATE HFA 108 (90 BASE) MCG/ACT IN AERS: INHALATION_SPRAY | RESPIRATORY_TRACT | 1 refills | Status: DC

## 2022-11-01 MED ORDER — LISINOPRIL 20 MG PO TABS: 20.0000 mg | ORAL_TABLET | Freq: Every day | ORAL | 1 refills | Status: DC

## 2022-11-01 MED ORDER — DEXCOM G7 SENSOR MISC: 1.0000 [IU] | 1 refills | Status: DC

## 2022-11-01 MED ORDER — AMLODIPINE BESYLATE 5 MG PO TABS: 5.0000 mg | ORAL_TABLET | Freq: Every day | ORAL | 1 refills | Status: DC

## 2022-11-01 MED ORDER — EZETIMIBE 10 MG PO TABS: 10.0000 mg | ORAL_TABLET | Freq: Every day | ORAL | 1 refills | Status: DC

## 2022-11-01 MED ORDER — BREZTRI AEROSPHERE 160-9-4.8 MCG/ACT IN AERO: 2.0000 | INHALATION_SPRAY | Freq: Two times a day (BID) | RESPIRATORY_TRACT | 11 refills | Status: DC

## 2022-11-01 MED ORDER — PREDNISONE 10 MG PO TABS: 10.0000 mg | ORAL_TABLET | Freq: Every day | ORAL | 0 refills | Status: DC

## 2022-11-01 NOTE — Assessment & Plan Note (Signed)
Chronic.  Controlled without medication at this time.  Return to clinic in 6 months for reevaluation.  Call sooner if concerns arise.

## 2022-11-01 NOTE — Assessment & Plan Note (Signed)
Chronic.  Controlled.  Continue with current medication regimen of Amlodipine '5mg'$ , Lisinoprol '40mg'$ , and HCTZ '25mg'$ .  Refills sent today.  Continue to check blood pressures at home.  Labs ordered today.  Return to clinic in 6 months for reevaluation.  Call sooner if concerns arise.

## 2022-11-01 NOTE — Assessment & Plan Note (Signed)
Recommended eating smaller high protein, low fat meals more frequently and exercising 30 mins a day 5 times a week with a goal of 10-15lb weight loss in the next 3 months. Patient voiced their understanding and motivation to adhere to these recommendations.  

## 2022-11-01 NOTE — Assessment & Plan Note (Signed)
Chronic.  Not well controlled.  Exacerbated due to illness.  Has not been on maintenance inhaler.  Will start Breztri.  Will treat with prednisone due to exacerbation.  Continue with current medication regimen.  Uses Albuterol more often right now due to illness. Labs ordered today.  Refills sent today.  Return to clinic in 6 months for reevaluation.  Call sooner if concerns arise.

## 2022-11-01 NOTE — Assessment & Plan Note (Addendum)
Chronic.  Controlled.  Continue with current medication regimen on Zetia '10mg'$ .  Does not tolerate statins.  Return to clinic in 6 months for reevaluation.  Call sooner if concerns arise.

## 2022-11-01 NOTE — Assessment & Plan Note (Addendum)
Chronic. Controlled on Metformin. Sugars are running 130-160 at home.  Labs ordered today.  Discussed getting updated eye exam.  Will make recommendations based on lab results.  Follow up in 6 months for reevaluation.

## 2022-11-01 NOTE — Assessment & Plan Note (Signed)
Chronic.  Not on statin therapy due to intolerance.

## 2022-11-01 NOTE — Assessment & Plan Note (Signed)
Chronic.  Controlled.  Continue with current medication regimen of Zetia '10mg'$ . Does not tolerate statins.  Refills sent today.  Labs ordered today. Return to clinic in 6 months for reevaluation.  Call sooner if concerns arise.

## 2022-11-02 LAB — COMPREHENSIVE METABOLIC PANEL
ALT: 35 IU/L — ABNORMAL HIGH (ref 0–32)
AST: 33 IU/L (ref 0–40)
Albumin/Globulin Ratio: 1.6 (ref 1.2–2.2)
Albumin: 4.5 g/dL (ref 3.9–4.9)
Alkaline Phosphatase: 68 IU/L (ref 44–121)
BUN/Creatinine Ratio: 21 (ref 12–28)
BUN: 16 mg/dL (ref 8–27)
Bilirubin Total: 0.4 mg/dL (ref 0.0–1.2)
CO2: 24 mmol/L (ref 20–29)
Calcium: 9.6 mg/dL (ref 8.7–10.3)
Chloride: 98 mmol/L (ref 96–106)
Creatinine, Ser: 0.75 mg/dL (ref 0.57–1.00)
Globulin, Total: 2.8 g/dL (ref 1.5–4.5)
Glucose: 135 mg/dL — ABNORMAL HIGH (ref 70–99)
Potassium: 4.3 mmol/L (ref 3.5–5.2)
Sodium: 138 mmol/L (ref 134–144)
Total Protein: 7.3 g/dL (ref 6.0–8.5)
eGFR: 88 mL/min/{1.73_m2} (ref >59)

## 2022-11-02 LAB — HEMOGLOBIN A1C
Est. average glucose Bld gHb Est-mCnc: 194 mg/dL
Hgb A1c MFr Bld: 8.4 % — ABNORMAL HIGH (ref 4.8–5.6)

## 2022-11-02 LAB — HEPATITIS C ANTIBODY: Hep C Virus Ab: NONREACTIVE

## 2022-11-02 LAB — LIPID PANEL
Chol/HDL Ratio: 4.2 ratio (ref 0.0–4.4)
Cholesterol, Total: 163 mg/dL (ref 100–199)
HDL: 39 mg/dL — ABNORMAL LOW (ref >39)
LDL Chol Calc (NIH): 68 mg/dL (ref 0–99)
Triglycerides: 356 mg/dL — ABNORMAL HIGH (ref 0–149)
VLDL Cholesterol Cal: 56 mg/dL — ABNORMAL HIGH (ref 5–40)

## 2022-11-02 NOTE — Progress Notes (Signed)
Please let patient know that her lab work shows that her A1c increased to 8.4.  I would like for her to start Ozempic which is a once weekly injection.  I am happy to have her come back in for a visit so we can discuss this further and I can show her how to use the injection.  Otherwise, her blood work looks good.  No other concerns at this time.

## 2022-11-06 ENCOUNTER — Ambulatory Visit: Payer: Medicare Other | Admitting: Nurse Practitioner

## 2022-11-09 ENCOUNTER — Ambulatory Visit
Admission: RE | Admit: 2022-11-09 | Discharge: 2022-11-09 | Disposition: A | Discharge status: Home / Self Care | Payer: Medicare Other | Source: Ambulatory Visit | Attending: Interventional Radiology | Admitting: Interventional Radiology

## 2022-11-09 DIAGNOSIS — N2889 Other specified disorders of kidney and ureter: Secondary | ICD-10-CM

## 2022-11-09 HISTORY — PX: IR RADIOLOGIST EVAL & MGMT: IMG5224

## 2022-11-09 NOTE — Progress Notes (Signed)
Chief Complaint: SP left RCC cryoablation   Referring Physician(s): Dr. Nickolas Madrid   History of Present Illness: Meredith Butler is a 66 y.o. female presenting as scheduled  follow up to [facility] today.  She is SP CT guided tissue ablation of left renal tumor, with biopsy performed at the same time.   She has had interval CT imaging.    Meredith Butler joins [place] today by way of virtual visit, and her husband joins [place] on the phone.  We confirmed her identity with 2 personal identifiers.     History:  We performed CT guided tissue ablation of the left kidney tumor with bx on 07/11/22, cryoablation technology.   Final Path 07/11/22:     She went home on the same day, and recovered fine.  She denies any [place] problems such as flank pain, hematuria, or other.  She says she has healed fine.   Interval History:   Meredith Butler denies any [place] symptoms of hematuria or signs of infection.  She does report a "little twinge" in her left flank if she moves a "certain way", but no concerning pain.    She has had updated CT imaging performed 10/23/22.  This shows the ablation defect at the left kidney, with no concerning features.  Additionally, she has changes of fatty liver disease.     Past Medical History:  Diagnosis Date   Anxiety    Arthritis    Asthma    COPD (chronic obstructive pulmonary disease) (HCC)    Diabetes mellitus without complication (HCC)    Fibromyalgia    GERD (gastroesophageal reflux disease)    Hypertension    Peritonitis ([place])    Pneumonia     Past Surgical History:  Procedure Laterality Date   ABDOMINAL SURGERY     APPENDECTOMY     CHOLECYSTECTOMY     COLON SURGERY     IR RADIOLOGIST EVAL & MGMT  05/31/2022   IR RADIOLOGIST EVAL & MGMT  08/07/2022   JOINT REPLACEMENT Bilateral    knee   LEFT HEART CATH AND CORONARY ANGIOGRAPHY N/A 01/05/2020   Procedure: LEFT HEART CATH AND CORONARY ANGIOGRAPHY;  Surgeon: Martinique, Peter M, MD;  Location: [facility];  Service:  Cardiovascular;  Laterality: N/A;   RADIOLOGY WITH ANESTHESIA N/A 07/11/2022   Procedure: Renal Cryo Ablation;  Surgeon: Corrie Mckusick, DO;  Location: [facility];  Service: Radiology;  Laterality: N/A;   SMALL INTESTINE SURGERY     TONSILLECTOMY     TOTAL ABDOMINAL HYSTERECTOMY W/ BILATERAL SALPINGOOPHORECTOMY     complete    Allergies: Claforan [cefotaxime], Ibuprofen, Omnicef [cefdinir], Penicillins, Sulfa antibiotics, Tetanus toxoids, Voltaren [diclofenac sodium], Codeine, Diovan [valsartan], Rocephin [ceftriaxone], Statins, and Streptococcus (diplococcus) pneumoniae [streptococci]  Medications: Prior to Admission medications   Medication Sig Start Date End Date Taking? Authorizing Provider  albuterol (VENTOLIN HFA) 108 (90 Base) MCG/ACT inhaler 2 puffs every 6 hours as needed. 11/01/22   [name], NP  amLODipine (NORVASC) 5 MG tablet Take 1 tablet (5 mg total) by mouth daily. 11/01/22   [name], NP  aspirin EC 81 MG tablet Take 325 mg by mouth every evening.    [provider]  Budeson-Glycopyrrol-Formoterol (BREZTRI AEROSPHERE) 160-9-4.8 MCG/ACT AERO Inhale 2 puffs into the lungs 2 (two) times daily. 11/01/22   [name], NP  Continuous Blood Gluc Sensor (DEXCOM G7 SENSOR) MISC 1 Units by Does not apply route every 14 (fourteen) days. 11/01/22   [name], NP  EPINEPHrine 0.3 mg/0.3 mL IJ SOAJ injection Inject 0.3 mg into the muscle as needed for anaphylaxis. 05/02/22   [name], NP  ezetimibe (ZETIA) 10 MG tablet Take 1 tablet (10 mg total) by mouth daily. 11/01/22   [name], NP  lisinopril (ZESTRIL) 20 MG tablet Take 1 tablet (20 mg total) by mouth daily. 11/01/22   [name], NP  lisinopril-hydrochlorothiazide (ZESTORETIC) 20-25 MG tablet Take 1 tablet by mouth daily. 11/01/22   [name], NP  Magnesium 250 MG TABS Take 250 mg by mouth daily as needed (leg cramps.).     [provider]  metFORMIN  (GLUCOPHAGE) 1000 MG tablet TAKE 1 TABLET (1,000 MG TOTAL) BY MOUTH TWICE A DAY WITH FOOD 11/01/22   [name], NP  [facility] VERIO test strip USE TO CHECK BLOOD SUGAR ONCE DAILY 05/02/22   [name], NP  predniSONE (DELTASONE) 10 MG tablet Take 1 tablet (10 mg total) by mouth daily with breakfast. Take 6 tomorrow, 5 today, and decrease by 1 each day until course is complete. 11/01/22   [name], NP     Family History  Problem Relation Age of Onset   Heart attack Mother    Cancer Mother    Diabetes Mother    Heart attack Father    Diabetes Sister    Diabetes Daughter    Cancer Maternal Grandmother        ovarian   Cancer Paternal Grandfather        colon   Cancer Sister        lung   Diabetes Daughter    Diabetes Maternal Aunt    Diabetes Maternal Uncle     Social History   Socioeconomic History   Marital status: Married    Spouse name: Not on file   Number of children: Not on file   Years of education: Not on file   Highest education level: Not on file  Occupational History   Not on file  Tobacco Use   Smoking status: Former    Packs/day: 2.00    Years: 30.00    Total pack years: 60.00    Types: Cigarettes    Quit date: 11/20/2009    Years since quitting: 12.9    Passive exposure: Past   Smokeless tobacco: Never  Vaping Use   Vaping Use: Never used  Substance and Sexual Activity   Alcohol use: No   Drug use: No   Sexual activity: Yes  Other Topics Concern   Not on file  Social History Narrative   Not on file   Social Determinants of Health   Financial Resource Strain: Low Risk  (05/24/2022)   Overall Financial Resource Strain (CARDIA)    Difficulty of Paying Living Expenses: Not hard at all  Food Insecurity: No Food Insecurity (05/24/2022)   Hunger Vital Sign    Worried About Running Out of Food in the Last Year: Never true    Ran Out of Food in the Last Year: Never true  Transportation Needs: No Transportation Needs (05/24/2022)    PRAPARE - Hydrologist (Medical): No    Lack of Transportation (Non-Medical): No  Physical Activity: Inactive (05/24/2022)   Exercise Vital Sign    Days of Exercise per Week: 0 days    Minutes of Exercise per Session: 0 min  Stress: No Stress Concern Present (05/24/2022)   [place]    Feeling of Stress : Not  at all  Social Connections: Moderately Integrated (05/24/2022)   Social Connection and Isolation Panel [NHANES]    Frequency of Communication with Friends and Family: More than three times a week    Frequency of Social Gatherings with Friends and Family: Once a week    Attends Religious Services: More than 4 times per year    Active Member of [employer] or Organizations: No    Attends Archivist Meetings: Never    Marital Status: Married       Review of Systems  Review of Systems: A 12 point ROS discussed and pertinent positives are indicated in the HPI above.  All other systems are negative.      Physical Exam No direct physical exam was performed (except for noted visual exam findings with Video Visits).    Vital Signs: There were no vitals taken for this visit.  Imaging: CT ABDOMEN W WO CONTRAST  Result Date: 10/24/2022 CLINICAL DATA:  Left renal mass. 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 RADIATION DOSE REDUCTION: This exam was performed according to the departmental dose-optimization program which includes automated exposure control, adjustment of the mA and/or kV according to patient size and/or use of iterative reconstruction technique. CONTRAST:  115m OMNIPAQUE IOHEXOL 300 MG/ML  SOLN COMPARISON:  Abdominal MRI 05/10/2022.  Chest CT  02/27/2022. FINDINGS: Lower chest: Clear lung bases. No significant pleural or pericardial effusion. Hepatobiliary: Background hepatic steatosis without focal lesion or abnormal enhancement. No evidence of gallstones, gallbladder wall thickening or biliary dilatation. Pancreas: Unremarkable. No pancreatic ductal dilatation or surrounding inflammatory changes. Spleen: Normal in size without focal abnormality. Adrenals/Urinary Tract: Both adrenal glands appear normal. Ablation defect in the interpolar region of the left kidney measures 3.4 x 2.1 cm on image 55/11 and shows a small amount of peripheral enhancement and adjacent soft tissue stranding. No residual focal mass lesion, suspicious enhancement or focal fluid collection. No [place] suspicious renal findings. Small renal cysts bilaterally are unchanged. Bladder not imaged. Stomach/Bowel: No enteric contrast administered. The stomach appears unremarkable for its degree of distension. No evidence of bowel wall thickening, distention or surrounding inflammatory change.There are bowel anastomosis clips in the mid abdomen which are incompletely visualized, but may relate to previous right hemicolectomy and small bowel anastomosis. Vascular/Lymphatic: There are no enlarged abdominal lymph nodes. Aortic and branch vessel atherosclerosis. The renal veins and IVC appear normal. Other: Small periumbilical hernia containing only fat. No ascites or peritoneal nodularity. Musculoskeletal: No acute or significant osseous findings. Thoracolumbar spondylosis. IMPRESSION: 1. Expected post ablation changes in the interpolar region of the left kidney. No evidence of local recurrence or metastatic disease. This examination can serve as baseline for subsequent follow-up imaging. 2. Hepatic steatosis. 3.  Aortic Atherosclerosis (ICD10-I70.0). Electronically Signed   By: WRichardean SaleM.D.   On: 10/24/2022 13:52    Labs:  CBC: Recent Labs    07/02/22 1352  WBC 5.6  HGB 14.0   HCT 41.6  PLT 286    COAGS: Recent Labs    07/02/22 1352  INR 1.0    BMP: Recent Labs    01/08/22 1045 05/02/22 1107 07/02/22 1352 10/23/22 1308 11/01/22 1132  NA 139 139 137  --  138  K 4.3 4.7 4.3  --  4.3  CL 98 99 102  --  98  CO2 '26 21 26  '$ --  24  GLUCOSE 130* 120* 114*  --  135*  BUN '12 15 16  '$ --  16  CALCIUM 10.0 10.1 9.7  --  9.6  CREATININE 0.60 0.89 0.95 0.70 0.75  GFRNONAA  --   --  >60  --   --     LIVER FUNCTION TESTS: Recent Labs    01/08/22 1045 05/02/22 1107 11/01/22 1132  BILITOT 0.7 0.7 0.4  AST 23 33 33  ALT 30 40* 35*  ALKPHOS 64 60 68  PROT 7.2 7.2 7.3  ALBUMIN 4.8 4.7 4.5    TUMOR MARKERS: No results for input(s): "AFPTM", "CEA", "CA199", "CHROMGRNA" in the last 8760 hours.  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   She is doing fine.    CT shows no concerning features.  We will initiate our surveillance, which we discussed.   She asked whether she would be safe to start ozempic medication.  I let her know that the [place] has been treated, and I do not think there is any known reason not to use ozempic medication if her doctors feel this would benefit her.   I did mention to her the fact that she has fatty liver disease on the CT scan, which is something I encouraged her to discuss with her PCP at the next visit.  She says she does not currently have a GI. She prob also needs a colonoscopy if this is the case.   Plan: - 6 month follow up appointment with repeat contrast enhanced abdominal CT (abd only needed) for surveillance, with office visit.  - I encouraged her to discuss the fatty liver disease with her PCP    Electronically Signed: Corrie Mckusick 11/09/2022, 9:38 AM   I spent a total of    15 Minutes in remote  clinical consultation, greater than 50% of which was counseling/coordinating care for SP cryoablation of left RCC, clear cell subtype.     Visit type: Audio only (telephone). Audio (no video) only due to patient's lack of internet/smartphone capability. Alternative for in-person consultation at [facility], [address] [place], [place], [place]. This visit type was conducted due to national recommendations for restrictions regarding the COVID-19 Pandemic (e.g. social distancing).  This format is felt to be most appropriate for this patient at this time.  All issues noted in this document were discussed and addressed.

## 2022-11-26 ENCOUNTER — Telehealth: Payer: Self-pay

## 2022-11-26 ENCOUNTER — Telehealth: Payer: Self-pay | Admitting: Nurse Practitioner

## 2022-11-26 NOTE — Telephone Encounter (Signed)
Copied from [place] [id number]. Topic: General - Inquiry >> Nov 26, 2022 12:23 PM Marcellus Scott wrote: Reason for CRM: Pt stated she needs help calibrating her Harris. Call dropped called back no answer.   Please advise.

## 2022-11-26 NOTE — Telephone Encounter (Signed)
Returned call from VM to schedule annual LDCT. No answer. Left message and call back number

## 2022-11-27 ENCOUNTER — Telehealth: Payer: Self-pay | Admitting: Nurse Practitioner

## 2022-11-27 NOTE — Telephone Encounter (Signed)
Patient called in to get clarification on prescription for Continuous Blood Gluc Sensor ([place]) MISC. Patient states that the pharmacy told her this is a 10 day sensor and the rx was written as if it were a 14 day sensor. Patient also is requesting help with calibrating the sensor.

## 2022-11-27 NOTE — Telephone Encounter (Signed)
Called pt, LVMTCB to discuss dexcom monitor and answer what questions she has.

## 2022-11-27 NOTE — Telephone Encounter (Signed)
Pharmacy is calling wanting to speak with a nurse regarding patients medication Continuous Blood Gluc Sensor ([place]) MISC .  Pharmacy is wanting to make sure this is the correct dosage for patient.   Please advise

## 2022-11-28 ENCOUNTER — Other Ambulatory Visit: Payer: Self-pay | Admitting: Nurse Practitioner

## 2022-11-28 MED ORDER — DEXCOM G7 SENSOR MISC: 1.0000 [IU] | 1 refills | Status: DC

## 2022-11-28 NOTE — Telephone Encounter (Signed)
RX changed to 1 sensor every 10 days. Routing to provider to approve if she agrees.

## 2022-11-28 NOTE — Telephone Encounter (Signed)
Please call and see if patient would like to come in for a nurse visit to get assistance with the Dexcom. She may want to try getting assistance with her pharmacy since she lives further away.

## 2022-11-28 NOTE — Telephone Encounter (Signed)
Regarding sensor:  Pharmacy comment: Script Clarification:PLEASE CLARIFY, USUALLY THESE SENSORS ARE REPLACED EVERY 10 DAYS??

## 2022-11-28 NOTE — Telephone Encounter (Signed)
Routing to provider to advise. See message below from the pharmacy. Requesting clarification on the quantity of the RX and if it was sent in correctly

## 2022-11-28 NOTE — Telephone Encounter (Signed)
Prescription updated.  She will have to come in for a nurse visit to have help with the dexcom.

## 2022-11-28 NOTE — Telephone Encounter (Signed)
Called patient to schedule the nurse visit. She states she will just get help with it at her next appointment. She states her sugar levels have been 202 and have gotten as high as 330. But she states she will take care of everything on 1/29

## 2022-11-29 LAB — HM DIABETES EYE EXAM

## 2022-12-04 ENCOUNTER — Encounter: Payer: Self-pay | Admitting: Nurse Practitioner

## 2022-12-12 NOTE — Telephone Encounter (Signed)
Pt has an appointment on 1/29 and stated she will speak with [name] about it at her appointment

## 2022-12-17 ENCOUNTER — Ambulatory Visit (INDEPENDENT_AMBULATORY_CARE_PROVIDER_SITE_OTHER): Payer: BC Managed Care – PPO | Admitting: Nurse Practitioner

## 2022-12-17 ENCOUNTER — Encounter: Payer: Self-pay | Admitting: Nurse Practitioner

## 2022-12-17 ENCOUNTER — Other Ambulatory Visit: Payer: Self-pay | Admitting: Nurse Practitioner

## 2022-12-17 DIAGNOSIS — E1169 Type 2 diabetes mellitus with other specified complication: Secondary | ICD-10-CM

## 2022-12-17 MED ORDER — OZEMPIC (0.25 OR 0.5 MG/DOSE) 2 MG/1.5ML ~~LOC~~ SOPN: 0.2500 mg | PEN_INJECTOR | SUBCUTANEOUS | 1 refills | Status: DC

## 2022-12-17 NOTE — Progress Notes (Signed)
BP 111/75   Pulse 84   Temp 98.3 F (36.8 C) (Oral)   Wt 227 lb 9.6 oz (103.2 kg)   SpO2 97%   BMI 34.61 kg/m    Subjective:    Patient ID: Meredith Butler, female    DOB: 1956/09/29, 67 y.o.   MRN: [id number]  HPI: Meredith Butler is a 67 y.o. female  Chief Complaint  Patient presents with   Diabetes   DIABETES Patient's A1c increased to 8.4%.  She is interested in starting Ozempic.   Hypoglycemic episodes:no Polydipsia/polyuria: no Visual disturbance: no Chest pain: no Paresthesias: no Glucose Monitoring: no  Accucheck frequency:  continuous  Fasting glucose: 130  Post prandial:  Evening:  Before meals:  Relevant past medical, surgical, family and social history reviewed and updated as indicated. Interim medical history since our last visit reviewed. Allergies and medications reviewed and updated.  Review of Systems  Eyes:  Negative for visual disturbance.  Cardiovascular:  Negative for chest pain.  Endocrine: Negative for polydipsia and polyuria.  Neurological:  Negative for numbness.    Per HPI unless specifically indicated above     Objective:    BP 111/75   Pulse 84   Temp 98.3 F (36.8 C) (Oral)   Wt 227 lb 9.6 oz (103.2 kg)   SpO2 97%   BMI 34.61 kg/m   Wt Readings from Last 3 Encounters:  12/17/22 227 lb 9.6 oz (103.2 kg)  11/01/22 230 lb 12.8 oz (104.7 kg)  08/08/22 227 lb 3.2 oz (103.1 kg)    Physical Exam Vitals and nursing note reviewed.  Constitutional:      General: She is not in acute distress.    Appearance: Normal appearance. She is not ill-appearing, toxic-appearing or diaphoretic.  HENT:     Head: Normocephalic.     Right Ear: External ear normal.     Left Ear: External ear normal.     Nose: Nose normal.     Mouth/Throat:     Mouth: Mucous membranes are moist.     Pharynx: Oropharynx is clear.  Eyes:     General:        Right eye: No discharge.        Left eye: No discharge.     Extraocular Movements: Extraocular movements  intact.     Conjunctiva/sclera: Conjunctivae normal.     Pupils: Pupils are equal, round, and reactive to light.  Cardiovascular:     Rate and Rhythm: Normal rate and regular rhythm.     Heart sounds: No murmur heard. Pulmonary:     Effort: Pulmonary effort is normal. No respiratory distress.     Breath sounds: Normal breath sounds. No wheezing or rales.  Musculoskeletal:     Cervical back: Normal range of motion and neck supple.  Skin:    General: Skin is warm and dry.     Capillary Refill: Capillary refill takes less than 2 seconds.  Neurological:     General: No focal deficit present.     Mental Status: She is alert and oriented to person, place, and time. Mental status is at baseline.  Psychiatric:        Mood and Affect: Mood normal.        Behavior: Behavior normal.        Thought Content: Thought content normal.        Judgment: Judgment normal.     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 Not well controlled.  Last 8.4%.  Will start Ozempic 0.'25mg'$  weekly.  Side effects and benefits discussed.  Discussed how to properly use medication.  Follow up in 2 months.  Call sooner if concerns arise.       Relevant Medications   Semaglutide,0.25 or 0.'5MG'$ /DOS, (OZEMPIC, 0.25 OR 0.5 MG/DOSE,) 2 MG/1.5ML SOPN     Follow up plan: Return in about 2 months (around 02/15/2023) for HTN, HLD, DM2 FU.

## 2022-12-17 NOTE — Assessment & Plan Note (Signed)
Chronic. Not well controlled.  Last 8.4%.  Will start Ozempic 0.'25mg'$  weekly.  Side effects and benefits discussed.  Discussed how to properly use medication.  Follow up in 2 months.  Call sooner if concerns arise.

## 2022-12-18 MED ORDER — OZEMPIC (0.25 OR 0.5 MG/DOSE) 2 MG/3ML ~~LOC~~ SOPN: 0.2500 mg | PEN_INJECTOR | SUBCUTANEOUS | 1 refills | Status: DC

## 2022-12-18 NOTE — Telephone Encounter (Signed)
Pharmacy comment: Product Backordered/Unavailable:'2MG'$ /1.5ML PEN DISCONTINUED-----RESEND AS '2MG'$ Fayne Mediate.   Requested Prescriptions  Pending Prescriptions Disp Refills   OZEMPIC, 0.25 OR 0.5 MG/DOSE, 2 MG/1.5ML SOPN [Pharmacy Med Name: OZEMPIC 0.25-0.5 MG/DOSE PEN]  1    Sig: INJECT 0.25 MG INTO THE SKIN ONCE A WEEK X 4 WEEKS, THEN INCREASE TO 0.'5MG'$  WEEKLY.     Endocrinology:  Diabetes - GLP-1 Receptor Agonists - semaglutide Failed - 12/17/2022  2:31 PM      Failed - HBA1C in normal range and within 180 days    HB A1C (BAYER DCA - WAIVED)  Date Value Ref Range Status  12/13/2020 6.8 <7.0 % Final    Comment:                                          Diabetic Adult            <7.0                                       Healthy Adult        4.3 - 5.7                                                           (DCCT/NGSP) American Diabetes Association's Summary of Glycemic Recommendations for Adults with Diabetes: Hemoglobin A1c <7.0%. More stringent glycemic goals (A1c <6.0%) may further reduce complications at the cost of increased risk of hypoglycemia.    Hgb A1c MFr Bld  Date Value Ref Range Status  11/01/2022 8.4 (H) 4.8 - 5.6 % Final    Comment:             Prediabetes: 5.7 - 6.4          Diabetes: >6.4          Glycemic control for adults with diabetes: <7.0          Passed - Cr in normal range and within 360 days    Creatinine, Ser  Date Value Ref Range Status  11/01/2022 0.75 0.57 - 1.00 mg/dL Final         Passed - Valid encounter within last 6 months    Recent Outpatient Visits           Yesterday Type 2 diabetes mellitus with morbid obesity ([place])   [place], NP   1 month ago Depression, recurrent [facility])   [place], NP   4 months ago Acute cystitis with hematuria   [place], NP   7 months ago Hypertension associated with diabetes [facility])    [place] [name], NP   11 months ago Hypertension associated with diabetes [facility])   [place] [name], NP       Future Appointments             In 1 month [name], NP [place], [facility]   In 4 months [name], NP [place] [place], [facility]

## 2022-12-18 NOTE — Telephone Encounter (Signed)
Medication changed

## 2022-12-19 ENCOUNTER — Telehealth: Payer: Self-pay

## 2022-12-19 NOTE — Telephone Encounter (Signed)
PA for Ozempic initiated and submitted via Cover My Meds. Key: [id number]

## 2022-12-27 ENCOUNTER — Ambulatory Visit: Payer: Self-pay | Admitting: *Deleted

## 2022-12-27 NOTE — Telephone Encounter (Signed)
2nd attempt to contact patient on [phone]. No answer, LVMTCB [phone].

## 2022-12-27 NOTE — Telephone Encounter (Signed)
3rd call attempted to contact patient on [phone] to review medications. No answer, LVMTCB [phone].

## 2022-12-27 NOTE — Telephone Encounter (Signed)
Summary: medication question   Pts first time picking up Ozempic today and pt wants to know if she is still suppose to take the metformin as well / please advise      Called patient [phone] to review medication questions. No answer, LVMTCB [phone].

## 2022-12-28 NOTE — Telephone Encounter (Signed)
Patient notified of providers message.

## 2022-12-28 NOTE — Telephone Encounter (Signed)
Routing to provider to advise, is the patient supposed to be taking both medications? Looks like Ozempic was added in addition to Metformin.

## 2022-12-28 NOTE — Telephone Encounter (Signed)
Yes she will take both medications.

## 2022-12-31 ENCOUNTER — Telehealth: Payer: BC Managed Care – PPO

## 2023-02-14 NOTE — Progress Notes (Signed)
BP (!) 137/91 (BP Location: Right Arm, Cuff Size: Large)   Pulse 84   Temp 98.5 F (36.9 C) (Oral)   Wt 226 lb 1.6 oz (102.6 kg)   SpO2 96%   BMI 34.38 kg/m    Subjective:    Patient ID: Meredith Butler, female    DOB: [birth year], 67 y.o.   MRN: [id number]  HPI: Meredith Butler is a 67 y.o. female  Chief Complaint  Patient presents with   Diabetes   Hyperlipidemia   Hypertension   HYPERTENSION Hypertension status: controlled  Satisfied with current treatment? yes Duration of hypertension: years BP monitoring frequency:  daily BP range: 117/89 BP medication side effects:  no Medication compliance: excellent compliance Previous BP meds:amlodipine, lisinopril, and lisinopril-HCTZ Aspirin: yes Recurrent headaches: yes Visual changes: no Palpitations: no Dyspnea: yes Chest pain: no Lower extremity edema: no Dizzy/lightheaded: no  DIABETES Patient stats she is doing well with the Ozempic.  She is doing the Ozempic 0.25mg .  Hypoglycemic episodes:no Polydipsia/polyuria: no Visual disturbance: no Chest pain: no Paresthesias: no Glucose Monitoring: yes  Accucheck frequency: Daily  Fasting glucose: 100-130  Post prandial:  Evening:  Before meals: Taking Insulin?: no  Long acting insulin:  Short acting insulin: Blood Pressure Monitoring: daily Retinal Examination: Up to Date Foot Exam: Up to Date Diabetic Education: Not Completed Pneumovax: Not up to Date Influenza: Not up to Date Aspirin: no  COPD COPD status: controlled Satisfied with current treatment?: yes Oxygen use: no Dyspnea frequency: once daily or 1x every two days Cough frequency: sometimes Rescue inhaler frequency:  once every other day Limitation of activity: yes Productive cough: Sometimes  Last Spirometry:  Pneumovax: Not up to Date Influenza: Not up to Date  Patient having dysuria, itching and pressure in the suprapubic pressure.  Relevant past medical, surgical, family and social history  reviewed and updated as indicated. Interim medical history since our last visit reviewed. Allergies and medications reviewed and updated.  Review of Systems  Eyes:  Negative for visual disturbance.  Respiratory:  Positive for shortness of breath. Negative for cough and chest tightness.   Cardiovascular:  Negative for chest pain, palpitations and leg swelling.  Endocrine: Negative for polydipsia and polyuria.  Genitourinary:  Positive for dysuria.       Vaginal itching, suprapubic pressure  Neurological:  Negative for dizziness, light-headedness, numbness and headaches.    Per HPI unless specifically indicated above     Objective:    BP (!) 137/91 (BP Location: Right Arm, Cuff Size: Large)   Pulse 84   Temp 98.5 F (36.9 C) (Oral)   Wt 226 lb 1.6 oz (102.6 kg)   SpO2 96%   BMI 34.38 kg/m   Wt Readings from Last 3 Encounters:  02/15/23 226 lb 1.6 oz (102.6 kg)  12/17/22 227 lb 9.6 oz (103.2 kg)  11/01/22 230 lb 12.8 oz (104.7 kg)    Physical Exam Vitals and nursing note reviewed.  Constitutional:      General: She is not in acute distress.    Appearance: Normal appearance. She is normal weight. She is not ill-appearing, toxic-appearing or diaphoretic.  HENT:     Head: Normocephalic.     Right Ear: External ear normal.     Left Ear: External ear normal.     Nose: Nose normal.     Mouth/Throat:     Mouth: Mucous membranes are moist.     Pharynx: Oropharynx is clear.  Eyes:     General:  Right eye: No discharge.        Left eye: No discharge.     Extraocular Movements: Extraocular movements intact.     Conjunctiva/sclera: Conjunctivae normal.     Pupils: Pupils are equal, round, and reactive to light.  Cardiovascular:     Rate and Rhythm: Normal rate and regular rhythm.     Heart sounds: No murmur heard. Pulmonary:     Effort: Pulmonary effort is normal. No respiratory distress.     Breath sounds: Normal breath sounds. No wheezing or rales.  Musculoskeletal:      Cervical back: Normal range of motion and neck supple.  Skin:    General: Skin is warm and dry.     Capillary Refill: Capillary refill takes less than 2 seconds.  Neurological:     General: No focal deficit present.     Mental Status: She is alert and oriented to person, place, and time. Mental status is at baseline.  Psychiatric:        Mood and Affect: Mood normal.        Behavior: Behavior normal.        Thought Content: Thought content normal.        Judgment: Judgment normal.     Results for orders placed or performed in visit on 12/04/22  HM DIABETES EYE EXAM  Result Value Ref Range   HM Diabetic Eye Exam No Retinopathy No Retinopathy      Assessment & Plan:   Problem List Items Addressed This Visit       Cardiovascular and Mediastinum   Aortic atherosclerosis ([place]) - Primary   Hypertension associated with diabetes ([place])   Relevant Orders   Comp Met (CMET)     Respiratory   Centrilobular emphysema ([place])     Endocrine   Type 2 diabetes mellitus with morbid obesity ([place])   Relevant Orders   HgB A1c   Hyperlipidemia associated with type 2 diabetes mellitus ([place])     Other   Morbid obesity ([place])   Myalgia due to statin   Depression, recurrent ([place])   Other Visit Diagnoses     Vaginal itching       Relevant Orders   Urinalysis, Routine w reflex microscopic   WET PREP FOR TRICH, YEAST, CLUE        Follow up plan: Return in about 3 months (around 05/18/2023) for HTN, HLD, DM2 FU.

## 2023-02-15 ENCOUNTER — Encounter: Payer: Self-pay | Admitting: Nurse Practitioner

## 2023-02-15 ENCOUNTER — Ambulatory Visit (INDEPENDENT_AMBULATORY_CARE_PROVIDER_SITE_OTHER): Payer: BC Managed Care – PPO | Admitting: Nurse Practitioner

## 2023-02-15 VITALS — BP 137/91 | HR 84 | Temp 98.5°F | Wt 226.1 lb

## 2023-02-15 DIAGNOSIS — J432 Centrilobular emphysema: Secondary | ICD-10-CM | POA: Diagnosis not present

## 2023-02-15 DIAGNOSIS — E1169 Type 2 diabetes mellitus with other specified complication: Secondary | ICD-10-CM

## 2023-02-15 DIAGNOSIS — E1159 Type 2 diabetes mellitus with other circulatory complications: Secondary | ICD-10-CM

## 2023-02-15 DIAGNOSIS — I700 Atherosclerosis of aorta: Secondary | ICD-10-CM | POA: Diagnosis not present

## 2023-02-15 DIAGNOSIS — F339 Major depressive disorder, recurrent, unspecified: Secondary | ICD-10-CM

## 2023-02-15 DIAGNOSIS — I152 Hypertension secondary to endocrine disorders: Secondary | ICD-10-CM

## 2023-02-15 DIAGNOSIS — N898 Other specified noninflammatory disorders of vagina: Secondary | ICD-10-CM

## 2023-02-15 DIAGNOSIS — E785 Hyperlipidemia, unspecified: Secondary | ICD-10-CM

## 2023-02-15 DIAGNOSIS — M791 Myalgia, unspecified site: Secondary | ICD-10-CM

## 2023-02-15 DIAGNOSIS — T466X5A Adverse effect of antihyperlipidemic and antiarteriosclerotic drugs, initial encounter: Secondary | ICD-10-CM

## 2023-02-15 LAB — URINALYSIS, ROUTINE W REFLEX MICROSCOPIC
Bilirubin, UA: NEGATIVE
Glucose, UA: NEGATIVE
Ketones, UA: NEGATIVE
Leukocytes,UA: NEGATIVE
Nitrite, UA: NEGATIVE
Protein,UA: NEGATIVE
RBC, UA: NEGATIVE
Specific Gravity, UA: 1.015 (ref 1.005–1.030)
Urobilinogen, Ur: 1 mg/dL (ref 0.2–1.0)
pH, UA: 7 (ref 5.0–7.5)

## 2023-02-15 LAB — WET PREP FOR TRICH, YEAST, CLUE
Clue Cell Exam: POSITIVE — AB
Trichomonas Exam: NEGATIVE
Yeast Exam: NEGATIVE

## 2023-02-15 MED ORDER — OZEMPIC (0.25 OR 0.5 MG/DOSE) 2 MG/1.5ML ~~LOC~~ SOPN: 0.5000 mg | PEN_INJECTOR | SUBCUTANEOUS | 1 refills | Status: DC

## 2023-02-15 MED ORDER — NOVOFINE PLUS PEN NEEDLE 32G X 4 MM MISC: 1.0000 | 3 refills | Status: DC

## 2023-02-15 MED ORDER — METRONIDAZOLE 500 MG PO TABS: 500.0000 mg | ORAL_TABLET | Freq: Two times a day (BID) | ORAL | 0 refills | Status: AC

## 2023-02-15 NOTE — Assessment & Plan Note (Signed)
Chronic.  Controlled.  Continue with current medication regimen.  Labs ordered today.  Return to clinic in 3 months for reevaluation.  Call sooner if concerns arise.   

## 2023-02-15 NOTE — Progress Notes (Signed)
Results discussed with patient during visit.

## 2023-02-15 NOTE — Assessment & Plan Note (Signed)
Chronic.  Controlled.  Continue with current medication regimen on Zetia 10mg.  Does not tolerate statins.  Return to clinic in 6 months for reevaluation.  Call sooner if concerns arise.   

## 2023-02-15 NOTE — Assessment & Plan Note (Signed)
Chronic.  Controlled.  Continue with current medication regimen of Amlodipine 5mg , Lisinoprol 40mg , and HCTZ 25mg .  Running slightly high at home with diastolic [id number].  If remains greater than 90 at next visit will increase Amlodipine to 10mg .  Continue to check blood pressures at home.  Labs ordered today.  Return to clinic in 3 months for reevaluation.  Call sooner if concerns arise.

## 2023-02-15 NOTE — Assessment & Plan Note (Signed)
Chronic.  Controlled.  Continue with current medication regimen of Zetia 10mg.  Does not tolerate statins.  Labs ordered today.  Return to clinic in 3 months for reevaluation.  Call sooner if concerns arise.   

## 2023-02-15 NOTE — Assessment & Plan Note (Signed)
Chronic.  Not on statin therapy due to intolerance.   

## 2023-02-15 NOTE — Assessment & Plan Note (Signed)
Chronic. Not well controlled.  Last 8.4%.  Will increase Ozempic 0.5mg  weekly.  Side effects and benefits discussed.  Labs ordered at visit today.  Discussed how to properly use medication.  Follow up in 3 months.  Call sooner if concerns arise.

## 2023-02-16 LAB — COMPREHENSIVE METABOLIC PANEL
ALT: 32 IU/L (ref 0–32)
AST: 29 IU/L (ref 0–40)
Albumin/Globulin Ratio: 1.8 (ref 1.2–2.2)
Albumin: 4.5 g/dL (ref 3.9–4.9)
Alkaline Phosphatase: 64 IU/L (ref 44–121)
BUN/Creatinine Ratio: 17 (ref 12–28)
BUN: 15 mg/dL (ref 8–27)
Bilirubin Total: 0.7 mg/dL (ref 0.0–1.2)
CO2: 22 mmol/L (ref 20–29)
Calcium: 9.8 mg/dL (ref 8.7–10.3)
Chloride: 102 mmol/L (ref 96–106)
Creatinine, Ser: 0.87 mg/dL (ref 0.57–1.00)
Globulin, Total: 2.5 g/dL (ref 1.5–4.5)
Glucose: 107 mg/dL — ABNORMAL HIGH (ref 70–99)
Potassium: 4.2 mmol/L (ref 3.5–5.2)
Sodium: 141 mmol/L (ref 134–144)
Total Protein: 7 g/dL (ref 6.0–8.5)
eGFR: 73 mL/min/{1.73_m2} (ref >59)

## 2023-02-16 LAB — HEMOGLOBIN A1C
Est. average glucose Bld gHb Est-mCnc: 151 mg/dL
Hgb A1c MFr Bld: 6.9 % — ABNORMAL HIGH (ref 4.8–5.6)

## 2023-02-19 ENCOUNTER — Other Ambulatory Visit: Payer: Self-pay | Admitting: Nurse Practitioner

## 2023-02-19 NOTE — Telephone Encounter (Signed)
Requested Prescriptions  Pending Prescriptions Disp Refills   Continuous Blood Gluc Sensor (DEXCOM G7 SENSOR) MISC [Pharmacy Med Name: [place] 3 each 2    Sig: REPLACE SENSOR EVERY 10 DAYS, REPLACE TRANSMITTER EVERY 59 DAYS     Endocrinology: Diabetes - Testing Supplies Passed - 02/19/2023  2:01 AM      Passed - Valid encounter within last 12 months    Recent Outpatient Visits           4 days ago Aortic atherosclerosis [facility])   [place] [name], NP   2 months ago Type 2 diabetes mellitus with morbid obesity ([place])   [place], Karen, NP   3 months ago Depression, recurrent [facility])   [place] [name], NP   6 months ago Acute cystitis with hematuria   [place] [name], NP   9 months ago Hypertension associated with diabetes [facility])   [place] [name], NP       Future Appointments             In 2 months [name], NP [place], [facility]

## 2023-03-01 ENCOUNTER — Ambulatory Visit
Admission: RE | Admit: 2023-03-01 | Discharge: 2023-03-01 | Disposition: A | Discharge status: Home / Self Care | Payer: BC Managed Care – PPO | Source: Ambulatory Visit | Attending: Nurse Practitioner | Admitting: Nurse Practitioner

## 2023-03-01 DIAGNOSIS — Z122 Encounter for screening for malignant neoplasm of respiratory organs: Secondary | ICD-10-CM | POA: Insufficient documentation

## 2023-03-01 DIAGNOSIS — Z87891 Personal history of nicotine dependence: Secondary | ICD-10-CM | POA: Insufficient documentation

## 2023-03-05 ENCOUNTER — Other Ambulatory Visit: Payer: Self-pay

## 2023-03-05 ENCOUNTER — Other Ambulatory Visit: Payer: Self-pay | Admitting: Nurse Practitioner

## 2023-03-05 DIAGNOSIS — Z122 Encounter for screening for malignant neoplasm of respiratory organs: Secondary | ICD-10-CM

## 2023-03-05 DIAGNOSIS — Z1231 Encounter for screening mammogram for malignant neoplasm of breast: Secondary | ICD-10-CM

## 2023-03-05 DIAGNOSIS — Z87891 Personal history of nicotine dependence: Secondary | ICD-10-CM

## 2023-03-27 ENCOUNTER — Ambulatory Visit
Admission: RE | Admit: 2023-03-27 | Discharge: 2023-03-27 | Disposition: A | Discharge status: Home / Self Care | Payer: Medicare Other | Source: Ambulatory Visit | Attending: Nurse Practitioner | Admitting: Nurse Practitioner

## 2023-03-27 DIAGNOSIS — Z1231 Encounter for screening mammogram for malignant neoplasm of breast: Secondary | ICD-10-CM | POA: Diagnosis not present

## 2023-03-28 ENCOUNTER — Other Ambulatory Visit: Payer: Self-pay | Admitting: Nurse Practitioner

## 2023-03-28 DIAGNOSIS — R921 Mammographic calcification found on diagnostic imaging of breast: Secondary | ICD-10-CM

## 2023-03-28 DIAGNOSIS — R928 Other abnormal and inconclusive findings on diagnostic imaging of breast: Secondary | ICD-10-CM

## 2023-03-28 NOTE — Progress Notes (Signed)
HI Meredith Butler.  Your Mammogram showed some calcifications.  The radiologist would like to look at it further with a diagnostic mammogram.  The breast center will reach out to you to get this scheduled.

## 2023-04-05 ENCOUNTER — Ambulatory Visit
Admission: RE | Admit: 2023-04-05 | Discharge: 2023-04-05 | Disposition: A | Discharge status: Home / Self Care | Payer: BC Managed Care – PPO | Source: Ambulatory Visit | Attending: Nurse Practitioner | Admitting: Nurse Practitioner

## 2023-04-05 ENCOUNTER — Other Ambulatory Visit: Payer: Self-pay | Admitting: Nurse Practitioner

## 2023-04-05 DIAGNOSIS — R921 Mammographic calcification found on diagnostic imaging of breast: Secondary | ICD-10-CM | POA: Insufficient documentation

## 2023-04-05 DIAGNOSIS — R928 Other abnormal and inconclusive findings on diagnostic imaging of breast: Secondary | ICD-10-CM | POA: Insufficient documentation

## 2023-04-08 NOTE — Progress Notes (Signed)
Please let patient know that her mammogram was abnormal.  The Radiologist recommended further evaluation.  The breast center will be reaching out to get this scheduled.

## 2023-04-10 ENCOUNTER — Ambulatory Visit
Admission: RE | Admit: 2023-04-10 | Discharge: 2023-04-10 | Disposition: A | Discharge status: Home / Self Care | Payer: BC Managed Care – PPO | Source: Ambulatory Visit | Attending: Nurse Practitioner | Admitting: Nurse Practitioner

## 2023-04-10 DIAGNOSIS — R921 Mammographic calcification found on diagnostic imaging of breast: Secondary | ICD-10-CM | POA: Insufficient documentation

## 2023-04-10 DIAGNOSIS — R928 Other abnormal and inconclusive findings on diagnostic imaging of breast: Secondary | ICD-10-CM | POA: Insufficient documentation

## 2023-04-10 HISTORY — PX: BREAST BIOPSY: SHX20

## 2023-04-10 MED ORDER — LIDOCAINE HCL 1 % IJ SOLN
15.0000 mL | Freq: Once | INTRAMUSCULAR | Status: AC
  Administered 2023-04-10: 15 mL

## 2023-04-10 MED ORDER — LIDOCAINE-EPINEPHRINE 1 %-1:100000 IJ SOLN
10.0000 mL | Freq: Once | INTRAMUSCULAR | Status: AC
  Administered 2023-04-10: 10 mL

## 2023-04-11 LAB — SURGICAL PATHOLOGY

## 2023-04-26 ENCOUNTER — Other Ambulatory Visit: Payer: Self-pay | Admitting: Interventional Radiology

## 2023-04-26 DIAGNOSIS — C642 Malignant neoplasm of left kidney, except renal pelvis: Secondary | ICD-10-CM

## 2023-05-07 ENCOUNTER — Ambulatory Visit (INDEPENDENT_AMBULATORY_CARE_PROVIDER_SITE_OTHER): Payer: BC Managed Care – PPO | Admitting: Nurse Practitioner

## 2023-05-07 ENCOUNTER — Encounter: Payer: Self-pay | Admitting: Nurse Practitioner

## 2023-05-07 VITALS — BP 131/84 | HR 86 | Temp 97.9°F | Wt 224.6 lb

## 2023-05-07 DIAGNOSIS — E1159 Type 2 diabetes mellitus with other circulatory complications: Secondary | ICD-10-CM | POA: Diagnosis not present

## 2023-05-07 DIAGNOSIS — E1169 Type 2 diabetes mellitus with other specified complication: Secondary | ICD-10-CM | POA: Diagnosis not present

## 2023-05-07 DIAGNOSIS — E785 Hyperlipidemia, unspecified: Secondary | ICD-10-CM

## 2023-05-07 DIAGNOSIS — I700 Atherosclerosis of aorta: Secondary | ICD-10-CM | POA: Diagnosis not present

## 2023-05-07 DIAGNOSIS — F339 Major depressive disorder, recurrent, unspecified: Secondary | ICD-10-CM

## 2023-05-07 DIAGNOSIS — Z7984 Long term (current) use of oral hypoglycemic drugs: Secondary | ICD-10-CM

## 2023-05-07 DIAGNOSIS — I152 Hypertension secondary to endocrine disorders: Secondary | ICD-10-CM

## 2023-05-07 DIAGNOSIS — J432 Centrilobular emphysema: Secondary | ICD-10-CM | POA: Diagnosis not present

## 2023-05-07 DIAGNOSIS — J0100 Acute maxillary sinusitis, unspecified: Secondary | ICD-10-CM

## 2023-05-07 MED ORDER — METFORMIN HCL 1000 MG PO TABS: ORAL_TABLET | ORAL | 1 refills | Status: DC

## 2023-05-07 MED ORDER — AZITHROMYCIN 250 MG PO TABS: ORAL_TABLET | ORAL | 0 refills | Status: AC

## 2023-05-07 MED ORDER — AMLODIPINE BESYLATE 5 MG PO TABS: 5.0000 mg | ORAL_TABLET | Freq: Every day | ORAL | 1 refills | Status: DC

## 2023-05-07 MED ORDER — EZETIMIBE 10 MG PO TABS: 10.0000 mg | ORAL_TABLET | Freq: Every day | ORAL | 1 refills | Status: DC

## 2023-05-07 MED ORDER — LISINOPRIL-HYDROCHLOROTHIAZIDE 20-25 MG PO TABS: 1.0000 | ORAL_TABLET | Freq: Every day | ORAL | 1 refills | Status: DC

## 2023-05-07 MED ORDER — LISINOPRIL 20 MG PO TABS: 20.0000 mg | ORAL_TABLET | Freq: Every day | ORAL | 1 refills | Status: DC

## 2023-05-07 NOTE — Assessment & Plan Note (Signed)
Chronic.  Controlled.  Continue with current medication regimen on Zetia 10mg .  Does not tolerate statins.  Return to clinic in 4 months for reevaluation.  Call sooner if concerns arise.

## 2023-05-07 NOTE — Assessment & Plan Note (Signed)
Chronic.  Controlled.  Continue with current medication regimen of Zetia 10mg . Does not tolerate statins.  Refills sent today.  Labs ordered today. Return to clinic in 4 months for reevaluation.  Call sooner if concerns arise.

## 2023-05-07 NOTE — Assessment & Plan Note (Signed)
Chronic.  Controlled. Continue with current medication regimen.  Does not want to start maintenance inhaler at this time.  Labs ordered today.  Return to clinic in 4 months for reevaluation.  Call sooner if concerns arise.

## 2023-05-07 NOTE — Assessment & Plan Note (Signed)
Chronic. Well controlled.  Last 6.9%.  Continue with Metformin and Ozempic 0.5mg  weekly.  Side effects and benefits discussed.  Labs ordered at visit today.  Discussed how to properly use medication.  Follow up in 4 months.  Call sooner if concerns arise.

## 2023-05-07 NOTE — Assessment & Plan Note (Signed)
Chronic.  Controlled without medication at this time.  Return to clinic in 6 months for reevaluation.  Call sooner if concerns arise.  ° °

## 2023-05-07 NOTE — Progress Notes (Signed)
BP 131/84   Pulse 86   Temp 97.9 F (36.6 C) (Oral)   Wt 224 lb 9.6 oz (101.9 kg)   SpO2 98%   BMI 34.15 kg/m    Subjective:    Patient ID: Meredith Butler, female    DOB: 11-May-1956, 67 y.o.   MRN: [id number]  HPI: Meredith Butler is a 67 y.o. female  Chief Complaint  Patient presents with   Hypertension   Diabetes   HYPERTENSION Hypertension status: controlled  Satisfied with current treatment? yes Duration of hypertension: years BP monitoring frequency:  daily BP range: 117/89- well controlled at home. Patient brought her log. BP medication side effects:  no Medication compliance: excellent compliance Previous BP meds:amlodipine, lisinopril, and lisinopril-HCTZ Aspirin: yes Recurrent headaches: yes Visual changes: no Palpitations: no Dyspnea: yes Chest pain: no Lower extremity edema: no Dizzy/lightheaded: no  DIABETES Patient stats she is doing well with the Ozempic.  She is doing the Ozempic 0.5mg .  She is also taking Metformin.   Hypoglycemic episodes:no Polydipsia/polyuria: no Visual disturbance: no Chest pain: no Paresthesias: no Glucose Monitoring: yes  Accucheck frequency: Daily  Fasting glucose: 100-130  Post prandial:  Evening:  Before meals: Taking Insulin?: no  Long acting insulin:  Short acting insulin: Blood Pressure Monitoring: daily Retinal Examination: Up to Date Foot Exam: Up to Date Diabetic Education: Not Completed Pneumovax: Not up to Date Influenza: Not up to Date Aspirin: no  COPD COPD status: controlled Satisfied with current treatment?: yes Oxygen use: no Dyspnea frequency: once daily or 1x every two days Cough frequency: sometimes Rescue inhaler frequency:  once every other day Limitation of activity: yes Productive cough: Sometimes  Last Spirometry:  Pneumovax: Not up to Date Influenza: Not up to Date  UPPER RESPIRATORY TRACT INFECTION Worst symptom: symptoms started 1 week ago Fever: no Cough: no Shortness of breath:  no Wheezing: no Chest pain: no Chest tightness: no Chest congestion: no Nasal congestion: yes Runny nose: no Post nasal drip: no Sneezing: no Sore throat: yes Swollen glands: no Sinus pressure: yes Headache: yes Face pain: yes Toothache: no Ear pain: yes bilateral Ear pressure: yes bilateral Eyes red/itching:no Eye drainage/crusting: no  Vomiting: no Rash: no Fatigue: yes Sick contacts: no Strep contacts: no  Context: stable Recurrent sinusitis: no Relief with OTC cold/cough medications: no  Treatments attempted: none    Relevant past medical, surgical, family and social history reviewed and updated as indicated. Interim medical history since our last visit reviewed. Allergies and medications reviewed and updated.  Review of Systems  Constitutional:  Negative for fatigue and fever.  HENT:  Positive for congestion, ear pain, rhinorrhea, sinus pressure and sinus pain. Negative for dental problem, postnasal drip, sneezing and sore throat.   Eyes:  Negative for visual disturbance.  Respiratory:  Positive for shortness of breath. Negative for cough, chest tightness and wheezing.   Cardiovascular:  Negative for chest pain, palpitations and leg swelling.  Gastrointestinal:  Negative for vomiting.  Endocrine: Negative for polydipsia and polyuria.  Genitourinary:        Vaginal itching, suprapubic pressure  Skin:  Negative for rash.  Neurological:  Positive for headaches. Negative for dizziness, light-headedness and numbness.    Per HPI unless specifically indicated above     Objective:    BP 131/84   Pulse 86   Temp 97.9 F (36.6 C) (Oral)   Wt 224 lb 9.6 oz (101.9 kg)   SpO2 98%   BMI 34.15 kg/m   Wt Readings from  Last 3 Encounters:  05/07/23 224 lb 9.6 oz (101.9 kg)  02/15/23 226 lb 1.6 oz (102.6 kg)  12/17/22 227 lb 9.6 oz (103.2 kg)    Physical Exam Vitals and nursing note reviewed.  Constitutional:      General: She is not in acute distress.     Appearance: Normal appearance. She is normal weight. She is not ill-appearing, toxic-appearing or diaphoretic.  HENT:     Head: Normocephalic.     Right Ear: Tympanic membrane and external ear normal.     Left Ear: Tympanic membrane and external ear normal.     Nose: Congestion and rhinorrhea present.     Right Sinus: Maxillary sinus tenderness and frontal sinus tenderness present.     Left Sinus: Maxillary sinus tenderness and frontal sinus tenderness present.     Mouth/Throat:     Mouth: Mucous membranes are moist.     Pharynx: Oropharynx is clear. Posterior oropharyngeal erythema present. No oropharyngeal exudate.  Eyes:     General:        Right eye: No discharge.        Left eye: No discharge.     Extraocular Movements: Extraocular movements intact.     Conjunctiva/sclera: Conjunctivae normal.     Pupils: Pupils are equal, round, and reactive to light.  Cardiovascular:     Rate and Rhythm: Normal rate and regular rhythm.     Heart sounds: No murmur heard. Pulmonary:     Effort: Pulmonary effort is normal. No respiratory distress.     Breath sounds: Normal breath sounds. No wheezing or rales.  Musculoskeletal:     Cervical back: Normal range of motion and neck supple.  Skin:    General: Skin is warm and dry.     Capillary Refill: Capillary refill takes less than 2 seconds.  Neurological:     General: No focal deficit present.     Mental Status: She is alert and oriented to person, place, and time. Mental status is at baseline.  Psychiatric:        Mood and Affect: Mood normal.        Behavior: Behavior normal.        Thought Content: Thought content normal.        Judgment: Judgment normal.     Results for orders placed or performed during the hospital encounter of 04/10/23  Surgical pathology  Result Value Ref Range   SURGICAL PATHOLOGY      SURGICAL PATHOLOGY CASE: [id number] PATIENT: Meredith Butler Surgical Pathology Report     Specimen Submitted: A. Breast,  left, upper inner quadrant  Clinical History: Screen detected calcifications left upper inner quadrant; ribbon clip      DIAGNOSIS: A.  BREAST CALCIFICATIONS, LEFT UPPER INNER QUADRANT; STEREOTACTIC BIOPSY: - ORGANIZING FAT NECROSIS WITH ASSOCIATED COARSE CALCIFICATIONS. - NEGATIVE FOR ATYPIA AND MALIGNANCY.  GROSS DESCRIPTION: A. Labeled: Left breast stereo calcs upper inner biopsy Received: in a formalin-filled [place] collection device Specimen radiograph image(s) available for review Time/Date in fixative: Collected at 9:03 AM on 04/10/2023 and placed in formalin at 9:04 AM on 04/10/2023 Cold ischemic time: Less than 5 minutes Total fixation time: Approximately 8 hours Core pieces: Multiple Measurement: Aggregate, 5.4 x 1.1 x 0.2 cm Description / comments: Received are cores and fragments of yellow fibrofatty tissue.  The accompanyi ng diagram has sections A, B, D, and E checked. Inked: Blue Entirely submitted in cassette(s):  1 - section A 2 - section B 3 - section D  4 - section E 5 - 6 - remaining tissue fragments      5 - sections C and F      6 - section G and remaining free-floating fragments  RB 04/10/2023  Final Diagnosis performed by Elijah Birk, MD.   Electronically signed 04/11/2023 10:48:29AM The electronic signature indicates that the named Attending Pathologist has evaluated the specimen Technical component performed at [facility], [address], [place], [place] [address] Lab: [phone] Dir: Jolene Schimke, MD, MMM  Professional component performed at [facility], [facility], [address] [place], [place], [place] [address] Lab: [phone] Dir: Beryle Quant, MD       Assessment & Plan:   Problem List Items Addressed This Visit       Cardiovascular and Mediastinum   Aortic atherosclerosis (HCC)    Chronic.  Controlled.  Continue with current medication regimen of Zetia 10mg . Does not tolerate statins.  Refills sent today.  Labs  ordered today. Return to clinic in 4 months for reevaluation.  Call sooner if concerns arise.        Relevant Medications   amLODipine (NORVASC) 5 MG tablet   ezetimibe (ZETIA) 10 MG tablet   lisinopril (ZESTRIL) 20 MG tablet   lisinopril-hydrochlorothiazide (ZESTORETIC) 20-25 MG tablet   Hypertension associated with diabetes (H[facility]) - Primary    Chronic.  Controlled.  Continue with current medication regimen of Amlodipine 5mg , Lisinoprol 40mg , and HCTZ 25mg .  Well controlled at home.   If needed can increase Amlodipine to 10mg .  Continue to check blood pressures at home.  Labs ordered today.  Return to clinic in 4 months for reevaluation.  Call sooner if concerns arise.        Relevant Medications   amLODipine (NORVASC) 5 MG tablet   ezetimibe (ZETIA) 10 MG tablet   lisinopril (ZESTRIL) 20 MG tablet   lisinopril-hydrochlorothiazide (ZESTORETIC) 20-25 MG tablet   metFORMIN (GLUCOPHAGE) 1000 MG tablet   Other Relevant Orders   Comp Met (CMET)     Respiratory   Centrilobular emphysema (HCC)    Chronic.  Controlled. Continue with current medication regimen.  Does not want to start maintenance inhaler at this time.  Labs ordered today.  Return to clinic in 4 months for reevaluation.  Call sooner if concerns arise.        Relevant Medications   azithromycin (ZITHROMAX) 250 MG tablet     Endocrine   Type 2 diabetes mellitus with morbid obesity (HCC)    Chronic. Well controlled.  Last 6.9%.  Continue with Metformin and Ozempic 0.5mg  weekly.  Side effects and benefits discussed.  Labs ordered at visit today.  Discussed how to properly use medication.  Follow up in 4 months.  Call sooner if concerns arise.       Relevant Medications   lisinopril (ZESTRIL) 20 MG tablet   lisinopril-hydrochlorothiazide (ZESTORETIC) 20-25 MG tablet   metFORMIN (GLUCOPHAGE) 1000 MG tablet   Other Relevant Orders   HgB A1c   Hyperlipidemia associated with type 2 diabetes mellitus (HCC)    Chronic.   Controlled.  Continue with current medication regimen on Zetia 10mg .  Does not tolerate statins.  Return to clinic in 4 months for reevaluation.  Call sooner if concerns arise.        Relevant Medications   amLODipine (NORVASC) 5 MG tablet   ezetimibe (ZETIA) 10 MG tablet   lisinopril (ZESTRIL) 20 MG tablet   lisinopril-hydrochlorothiazide (ZESTORETIC) 20-25 MG tablet   metFORMIN (GLUCOPHAGE) 1000 MG tablet  Other Relevant Orders   Lipid Profile     Other   Morbid obesity (HCC)    Recommended eating smaller high protein, low fat meals more frequently and exercising 30 mins a day 5 times a week with a goal of 10-15lb weight loss in the next 3 months.       Relevant Medications   metFORMIN (GLUCOPHAGE) 1000 MG tablet   Depression, recurrent (HCC)    Chronic.  Controlled without medication at this time.  Return to clinic in 6 months for reevaluation.  Call sooner if concerns arise.        Other Visit Diagnoses     Acute non-recurrent maxillary sinusitis       Will treat with azithromycin due to patient tolerating it well.   Relevant Medications   azithromycin (ZITHROMAX) 250 MG tablet        Follow up plan: Return in about 4 months (around 09/06/2023) for HTN, HLD, DM2 FU.

## 2023-05-07 NOTE — Assessment & Plan Note (Signed)
Chronic.  Controlled.  Continue with current medication regimen of Amlodipine 5mg , Lisinoprol 40mg , and HCTZ 25mg .  Well controlled at home.   If needed can increase Amlodipine to 10mg .  Continue to check blood pressures at home.  Labs ordered today.  Return to clinic in 4 months for reevaluation.  Call sooner if concerns arise.

## 2023-05-07 NOTE — Assessment & Plan Note (Signed)
Recommended eating smaller high protein, low fat meals more frequently and exercising 30 mins a day 5 times a week with a goal of 10-15lb weight loss in the next 3 months.  

## 2023-05-08 LAB — LIPID PANEL
Chol/HDL Ratio: 3.4 ratio (ref 0.0–4.4)
Cholesterol, Total: 156 mg/dL (ref 100–199)
HDL: 46 mg/dL (ref >39)
LDL Chol Calc (NIH): 84 mg/dL (ref 0–99)
Triglycerides: 152 mg/dL — ABNORMAL HIGH (ref 0–149)
VLDL Cholesterol Cal: 26 mg/dL (ref 5–40)

## 2023-05-08 LAB — COMPREHENSIVE METABOLIC PANEL
ALT: 36 IU/L — ABNORMAL HIGH (ref 0–32)
AST: 28 IU/L (ref 0–40)
Albumin: 4.5 g/dL (ref 3.9–4.9)
Alkaline Phosphatase: 60 IU/L (ref 44–121)
BUN/Creatinine Ratio: 19 (ref 12–28)
BUN: 16 mg/dL (ref 8–27)
Bilirubin Total: 0.7 mg/dL (ref 0.0–1.2)
CO2: 25 mmol/L (ref 20–29)
Calcium: 9.8 mg/dL (ref 8.7–10.3)
Chloride: 102 mmol/L (ref 96–106)
Creatinine, Ser: 0.85 mg/dL (ref 0.57–1.00)
Globulin, Total: 2.8 g/dL (ref 1.5–4.5)
Glucose: 97 mg/dL (ref 70–99)
Potassium: 4.4 mmol/L (ref 3.5–5.2)
Sodium: 141 mmol/L (ref 134–144)
Total Protein: 7.3 g/dL (ref 6.0–8.5)
eGFR: 75 mL/min/{1.73_m2} (ref >59)

## 2023-05-08 LAB — HEMOGLOBIN A1C
Est. average glucose Bld gHb Est-mCnc: 146 mg/dL
Hgb A1c MFr Bld: 6.7 % — ABNORMAL HIGH (ref 4.8–5.6)

## 2023-05-08 NOTE — Progress Notes (Signed)
Hi Darl Pikes. It was nice to see you yesterday.  Your lab work looks good.  Your A1c is well controlled at 6.7%.  I do think staying on the Ozempic is a good idea.  No concerns at this time. Continue with your current medication regimen.  Follow up as discussed.  Please let me know if you have any questions.

## 2023-05-09 ENCOUNTER — Ambulatory Visit
Admission: RE | Admit: 2023-05-09 | Discharge: 2023-05-09 | Disposition: A | Discharge status: Home / Self Care | Payer: BC Managed Care – PPO | Source: Ambulatory Visit | Attending: Interventional Radiology | Admitting: Interventional Radiology

## 2023-05-09 DIAGNOSIS — C642 Malignant neoplasm of left kidney, except renal pelvis: Secondary | ICD-10-CM | POA: Insufficient documentation

## 2023-05-09 MED ORDER — IOHEXOL 300 MG/ML  SOLN
100.0000 mL | Freq: Once | INTRAMUSCULAR | Status: AC | PRN
  Administered 2023-05-09: 100 mL via INTRAVENOUS

## 2023-05-15 ENCOUNTER — Ambulatory Visit
Admission: RE | Admit: 2023-05-15 | Discharge: 2023-05-15 | Disposition: A | Discharge status: Home / Self Care | Payer: Medicare Other | Source: Ambulatory Visit | Attending: Interventional Radiology | Admitting: Interventional Radiology

## 2023-05-15 DIAGNOSIS — C642 Malignant neoplasm of left kidney, except renal pelvis: Secondary | ICD-10-CM

## 2023-05-15 NOTE — Progress Notes (Signed)
Chief Complaint: SP left RCC cryoablation   Referring Physician(s): Dr. Legrand Rams   History of Present Illness: Meredith Butler is a 67 y.o. female presenting as scheduled  follow up to VIR clinic today.  She is SP CT guided tissue ablation of left renal tumor, with biopsy performed at the same time.   Today Meredith Butler joins [place] by way of telemedicine.  We confirmed her identity with 2 personal identifiers.    History:  We performed CT guided tissue ablation of the left kidney tumor with bx on 07/11/22, cryoablation technology.   Final Path 07/11/22:     She went home on the same day, and recovered fine.  She denies any Asay problems such as flank pain, hematuria, or other.     First follow up imaging was 10/23/22.  This shows the ablation defect at the left kidney, with no concerning features.     Interval History:    Today Meredith Butler tells me that she is feeling fine.  She denies any Kolasinski symptoms.  She denies any flank pain and denies hematuria.    We have again interval CT imaging, which shows expected contraction of the ablation site with no concerning features.     Past Medical History:  Diagnosis Date   Anxiety    Arthritis    Asthma    COPD (chronic obstructive pulmonary disease) (HCC)    Diabetes mellitus without complication (HCC)    Fibromyalgia    GERD (gastroesophageal reflux disease)    Hypertension    Peritonitis (HCC)    Pneumonia     Past Surgical History:  Procedure Laterality Date   ABDOMINAL SURGERY     APPENDECTOMY     BREAST BIOPSY Left 04/10/2023   Stereo Bx, Ribbon Clip, Path Pending   BREAST BIOPSY Left 04/10/2023   MM LT BREAST BX W LOC DEV 1ST LESION IMAGE BX SPEC STEREO GUIDE 04/10/2023 [facility]   CHOLECYSTECTOMY     COLON SURGERY     IR RADIOLOGIST EVAL & MGMT  05/31/2022   IR RADIOLOGIST EVAL & MGMT  08/07/2022   IR RADIOLOGIST EVAL & MGMT  11/09/2022   JOINT REPLACEMENT Bilateral    knee   LEFT HEART CATH AND CORONARY ANGIOGRAPHY  N/A 01/05/2020   Procedure: LEFT HEART CATH AND CORONARY ANGIOGRAPHY;  Surgeon: [place], Peter M, MD;  Location: [facility];  Service: Cardiovascular;  Laterality: N/A;   RADIOLOGY WITH ANESTHESIA N/A 07/11/2022   Procedure: Renal Cryo Ablation;  Surgeon: Gilmer Mor, DO;  Location: [facility];  Service: Radiology;  Laterality: N/A;   SMALL INTESTINE SURGERY     TONSILLECTOMY     TOTAL ABDOMINAL HYSTERECTOMY W/ BILATERAL SALPINGOOPHORECTOMY     complete    Allergies: Claforan [cefotaxime], Ibuprofen, Omnicef [cefdinir], Penicillins, Sulfa antibiotics, Tetanus toxoids, Voltaren [diclofenac sodium], Codeine, Diovan [valsartan], Rocephin [ceftriaxone], Statins, and Streptococcus (diplococcus) pneumoniae [streptococci]  Medications: Prior to Admission medications   Medication Sig Start Date End Date Taking? Authorizing Provider  albuterol (VENTOLIN HFA) 108 (90 Base) MCG/ACT inhaler 2 puffs every 6 hours as needed. 11/01/22   Larae Grooms, NP  amLODipine (NORVASC) 5 MG tablet Take 1 tablet (5 mg total) by mouth daily. 05/07/23   Larae Grooms, NP  aspirin EC 81 MG tablet Take 81 mg by mouth every evening.    [provider]  Continuous Blood Gluc Sensor (DEXCOM G7 SENSOR) MISC REPLACE SENSOR EVERY 10 DAYS, REPLACE TRANSMITTER EVERY 90 DAYS 02/19/23   Caren Griffins,  Clydie Braun, NP  EPINEPHrine 0.3 mg/0.3 mL IJ SOAJ injection Inject 0.3 mg into the muscle as needed for anaphylaxis. 05/02/22   Larae Grooms, NP  ezetimibe (ZETIA) 10 MG tablet Take 1 tablet (10 mg total) by mouth daily. 05/07/23   Larae Grooms, NP  Insulin Pen Needle (NOVOFINE PLUS PEN NEEDLE) 32G X 4 MM MISC 1 each by Does not apply route once a week. 02/15/23   Larae Grooms, NP  lisinopril (ZESTRIL) 20 MG tablet Take 1 tablet (20 mg total) by mouth daily. 05/07/23   Larae Grooms, NP  lisinopril-hydrochlorothiazide (ZESTORETIC) 20-25 MG tablet Take 1 tablet by mouth daily. 05/07/23   Larae Grooms, NP   Magnesium 250 MG TABS Take 250 mg by mouth daily as needed (leg cramps.).     [provider]  metFORMIN (GLUCOPHAGE) 1000 MG tablet TAKE 1 TABLET (1,000 MG TOTAL) BY MOUTH TWICE A DAY WITH FOOD 05/07/23   Larae Grooms, NP  [facility] VERIO test strip USE TO CHECK BLOOD SUGAR ONCE DAILY 05/02/22   Larae Grooms, NP  Semaglutide,0.25 or 0.5MG /DOS, (OZEMPIC, 0.25 OR 0.5 MG/DOSE,) 2 MG/1.5ML SOPN Inject 0.5 mg into the skin once a week. Start with 0.25MG  once a week x 4 weeks, then increase to 0.5MG  weekly. 02/15/23   Larae Grooms, NP     Family History  Problem Relation Age of Onset   Heart attack Mother    Cancer Mother    Diabetes Mother    Heart attack Father    Diabetes Sister    Diabetes Daughter    Cancer Maternal Grandmother        ovarian   Cancer Paternal Grandfather        colon   Cancer Sister        lung   Diabetes Daughter    Diabetes Maternal Aunt    Diabetes Maternal Uncle     Social History   Socioeconomic History   Marital status: Married    Spouse name: Not on file   Number of children: Not on file   Years of education: Not on file   Highest education level: Not on file  Occupational History   Not on file  Tobacco Use   Smoking status: Former    Packs/day: 2.00    Years: 30.00    Additional pack years: 0.00    Total pack years: 60.00    Types: Cigarettes    Quit date: 11/20/2009    Years since quitting: 13.4    Passive exposure: Past   Smokeless tobacco: Never  Vaping Use   Vaping Use: Never used  Substance and Sexual Activity   Alcohol use: No   Drug use: No   Sexual activity: Yes  Other Topics Concern   Not on file  Social History Narrative   Not on file   Social Determinants of Health   Financial Resource Strain: Low Risk  (05/24/2022)   Overall Financial Resource Strain (CARDIA)    Difficulty of Paying Living Expenses: Not hard at all  Food Insecurity: No Food Insecurity (05/24/2022)   Hunger Vital Sign    Worried About  Running Out of Food in the Last Year: Never true    Ran Out of Food in the Last Year: Never true  Transportation Needs: No Transportation Needs (05/24/2022)   PRAPARE - Administrator, Civil Service (Medical): No    Lack of Transportation (Non-Medical): No  Physical Activity: Inactive (05/24/2022)   Exercise Vital Sign    Days  of Exercise per Week: 0 days    Minutes of Exercise per Session: 0 min  Stress: No Stress Concern Present (05/24/2022)   [employer] of Occupational Health - Occupational Stress Questionnaire    Feeling of Stress : Not at all  Social Connections: Moderately Integrated (05/24/2022)   Social Connection and Isolation Panel [NHANES]    Frequency of Communication with Friends and Family: More than three times a week    Frequency of Social Gatherings with Friends and Family: Once a week    Attends Religious Services: More than 4 times per year    Active Member of [employer] or Organizations: No    Attends Banker Meetings: Never    Marital Status: Married      Review of Systems  Review of Systems: A 12 point ROS discussed and pertinent positives are indicated in the HPI above.  All other systems are negative.  Advance Care Plan: The advanced care plan/surrogate decision maker was discussed at the time of visit and documented in the medical record.    Physical Exam No direct physical exam was performed (except for noted visual exam findings with Video Visits).    Vital Signs: There were no vitals taken for this visit.  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 * Tracking Code: BO * EXAM: CT ABDOMEN WITHOUT AND WITH CONTRAST TECHNIQUE: Multidetector CT imaging of the abdomen was performed following the standard protocol before and following the bolus administration of intravenous contrast. RADIATION DOSE REDUCTION: This exam was  performed according to the departmental dose-optimization program which includes automated exposure control, adjustment of the mA and/or kV according to patient size and/or use of iterative reconstruction technique. CONTRAST:  OMNIPAQUE IOHEXOL 300 MG/ML  SOLN COMPARISON:  10/23/2022 FINDINGS: Lower chest: Clear lung bases. Normal heart size without pericardial or pleural effusion. Hepatobiliary: Marked hepatic steatosis. No suspicious liver lesion. Normal gallbladder, without biliary ductal dilatation. Pancreas: Pancreatic head and uncinate process fatty replacement. No duct dilatation or acute inflammation. Spleen: Normal in size, without focal abnormality. Adrenals/Urinary Tract: Normal adrenal glands. No renal calculi or hydronephrosis. 1.5 cm interpolar left renal cyst or minimally complex cyst. Right renal too small to characterize lesions . In the absence of clinically indicated signs/symptoms require(s) no independent follow-up. The ablation defect within the lateral upper/interpolar left kidney measures 3.0 x 1.7 cm on 80/11 versus 3.4 x 2.1 cm previously. No local recurrence. No procedure related fluid collection or collecting system abnormality. Stomach/Bowel: Normal stomach, without wall thickening. Periampullary duodenal diverticulum. Enterotomy. Probable right hemicolectomy. Vascular/Lymphatic: Aortic atherosclerosis. Patent renal veins. No retroperitoneal or retrocrural adenopathy. Other: No ascites.  Ventral abdominal wall laxity contains fat. Musculoskeletal: No acute osseous abnormality. IMPRESSION: 1. Regression of left-sided ablation defect, without local recurrence or metastatic disease. 2. Hepatic steatosis 3.  Aortic Atherosclerosis (ICD10-I70.0). Electronically Signed   By: Jeronimo Greaves M.D.   On: 05/13/2023 15:02    Labs:  CBC: Recent Labs    07/02/22 1352  WBC 5.6  HGB 14.0  HCT 41.6  PLT 286    COAGS: Recent Labs    07/02/22 1352  INR 1.0    BMP: Recent Labs     07/02/22 1352 10/23/22 1308 11/01/22 1132 02/15/23 1040 05/07/23 1108  NA 137  --  138 141 141  K 4.3  --  4.3 4.2 4.4  CL 102  --  98 102  102  CO2 26  --  24 22 25   GLUCOSE 114*  --  135* 107* 97  BUN 16  --  16 15 16   CALCIUM 9.7  --  9.6 9.8 9.8  CREATININE 0.95 0.70 0.75 0.87 0.85  GFRNONAA >60  --   --   --   --     LIVER FUNCTION TESTS: Recent Labs    11/01/22 1132 02/15/23 1040 05/07/23 1108  BILITOT 0.4 0.7 0.7  AST 33 29 28  ALT 35* 32 36*  ALKPHOS 68 64 60  PROT 7.3 7.0 7.3  ALBUMIN 4.5 4.5 4.5    TUMOR MARKERS: No results for input(s): "AFPTM", "CEA", "CA199", "CHROMGRNA" in the last 8760 hours.  Assessment and Plan:   Meredith Butler is 67 yo female SP treatment of stage 1a left RCC (clear cell) with ablation/cryoablation.    She is doing fine.     CT shows no concerning features.  We will continue our surveillance, which we discussed.    Our plan will be repeat CT scan in about 6 months from now.    Plan: - 6 month interval follow up appointment with repeat contrast enhanced abdominal CT (abd only needed) for surveillance, with office visit.       Electronically Signed: Gilmer Mor 05/15/2023, 4:38 PM   I spent a total of    25 Minutes in remote  clinical consultation, greater than 50% of which was counseling/coordinating care for left renal RCC, SP ablation, surveillance.    Visit type: Audio only (telephone). Audio (no video) only due to patient's lack of internet/smartphone capability. Alternative for in-person consultation at [facility], [address] [place], [place], [place]. This visit type was conducted due to national recommendations for restrictions regarding the COVID-19 Pandemic (e.g. social distancing).  This format is felt to be most appropriate for this patient at this time.  All issues noted in this document were discussed and addressed.

## 2023-05-25 ENCOUNTER — Other Ambulatory Visit: Payer: Self-pay | Admitting: Nurse Practitioner

## 2023-05-27 NOTE — Telephone Encounter (Signed)
Requested Prescriptions  Pending Prescriptions Disp Refills   Continuous Glucose Sensor (DEXCOM G7 SENSOR) MISC [Pharmacy Med Name: DEXCOM G7 SENSOR] 3 each 0    Sig: REPLACE SENSOR EVERY 10 DAYS, REPLACE TRANSMITTER EVERY 90 DAYS     Endocrinology: Diabetes - Testing Supplies Passed - 05/25/2023  9:05 AM      Passed - Valid encounter within last 12 months    Recent Outpatient Visits           2 weeks ago Hypertension associated with diabetes [facility])   [place] [facility] Larae Grooms, NP   3 months ago Aortic atherosclerosis [facility])   [place] [facility] Larae Grooms, NP   5 months ago Type 2 diabetes mellitus with morbid obesity [facility])   [place] [facility] Larae Grooms, NP   6 months ago Depression, recurrent [facility])   [place] [facility] Larae Grooms, NP   9 months ago Acute cystitis with hematuria   [place] [facility] Larae Grooms, NP       Future Appointments             In 3 months Larae Grooms, NP [place] [facility]

## 2023-05-28 ENCOUNTER — Ambulatory Visit (INDEPENDENT_AMBULATORY_CARE_PROVIDER_SITE_OTHER): Payer: BC Managed Care – PPO

## 2023-05-28 VITALS — Wt 224.0 lb

## 2023-05-28 DIAGNOSIS — Z0000 Encounter for general adult medical examination without abnormal findings: Secondary | ICD-10-CM

## 2023-05-28 NOTE — Patient Instructions (Signed)
Meredith Butler , Thank you for taking time to come for your Medicare Wellness Visit. I appreciate your ongoing commitment to your health goals. Please review the following plan we discussed and let me know if I can assist you in the future.   These are the goals we discussed:  Goals      DIET - EAT MORE FRUITS AND VEGETABLES     Patient Stated     Would like to breath better        This is a list of the screening recommended for you and due dates:  Health Maintenance  Topic Date Due   COVID-19 Vaccine (1) Never done   Zoster (Shingles) Vaccine (1 of 2) Never done   DEXA scan (bone density measurement)  Never done   Pneumonia Vaccine (1 of 2 - PCV) 11/02/2023*   Flu Shot  06/20/2023   Yearly kidney health urinalysis for diabetes  11/02/2023   Complete foot exam   11/02/2023   Hemoglobin A1C  11/06/2023   Eye exam for diabetics  11/30/2023   Screening for Lung Cancer  02/29/2024   Mammogram  03/26/2024   Yearly kidney function blood test for diabetes  05/06/2024   Medicare Annual Wellness Visit  05/27/2024   Cologuard (Stool DNA test)  10/22/2025   Hepatitis C Screening  Completed   HPV Vaccine  Aged Out   DTaP/Tdap/Td vaccine  Discontinued  *Topic was postponed. The date shown is not the original due date.    Advanced directives: no  Conditions/risks identified: none  Next appointment: Follow up in one year for your annual wellness visit 06/01/24 @ 2:30 pm by phone   Preventive Care 65 Years and Older, Female Preventive care refers to lifestyle choices and visits with your health care provider that can promote health and wellness. What does preventive care include? A yearly physical exam. This is also called an annual well check. Dental exams once or twice a year. Routine eye exams. Ask your health care provider how often you should have your eyes checked. Personal lifestyle choices, including: Daily care of your teeth and gums. Regular physical activity. Eating a healthy  diet. Avoiding tobacco and drug use. Limiting alcohol use. Practicing safe sex. Taking low-dose aspirin every day. Taking vitamin and mineral supplements as recommended by your health care provider. What happens during an annual well check? The services and screenings done by your health care provider during your annual well check will depend on your age, overall health, lifestyle risk factors, and family history of disease. Counseling  Your health care provider may ask you questions about your: Alcohol use. Tobacco use. Drug use. Emotional well-being. Home and relationship well-being. Sexual activity. Eating habits. History of falls. Memory and ability to understand (cognition). Work and work Astronomer. Reproductive health. Screening  You may have the following tests or measurements: Height, weight, and BMI. Blood pressure. Lipid and cholesterol levels. These may be checked every 5 years, or more frequently if you are over 55 years old. Skin check. Lung cancer screening. You may have this screening every year starting at age 62 if you have a 30-pack-year history of smoking and currently smoke or have quit within the past 15 years. Fecal occult blood test (FOBT) of the stool. You may have this test every year starting at age 11. Flexible sigmoidoscopy or colonoscopy. You may have a sigmoidoscopy every 5 years or a colonoscopy every 10 years starting at age 39. Hepatitis C blood test. Hepatitis B blood test.  Sexually transmitted disease (STD) testing. Diabetes screening. This is done by checking your blood sugar (glucose) after you have not eaten for a while (fasting). You may have this done every 1-3 years. Bone density scan. This is done to screen for osteoporosis. You may have this done starting at age 62. Mammogram. This may be done every 1-2 years. Talk to your health care provider about how often you should have regular mammograms. Talk with your health care provider about  your test results, treatment options, and if necessary, the need for more tests. Vaccines  Your health care provider may recommend certain vaccines, such as: Influenza vaccine. This is recommended every year. Tetanus, diphtheria, and acellular pertussis (Tdap, Td) vaccine. You may need a Td booster every 10 years. Zoster vaccine. You may need this after age 44. Pneumococcal 13-valent conjugate (PCV13) vaccine. One dose is recommended after age 43. Pneumococcal polysaccharide (PPSV23) vaccine. One dose is recommended after age 55. Talk to your health care provider about which screenings and vaccines you need and how often you need them. This information is not intended to replace advice given to you by your health care provider. Make sure you discuss any questions you have with your health care provider. Document Released: 12/02/2015 Document Revised: 07/25/2016 Document Reviewed: 09/06/2015 Elsevier Interactive Patient Education  2017 [employer].  Fall Prevention in the Home Falls can cause injuries. They can happen to people of all ages. There are many things you can do to make your home safe and to help prevent falls. What can I do on the outside of my home? Regularly fix the edges of walkways and driveways and fix any cracks. Remove anything that might make you trip as you walk through a door, such as a raised step or threshold. Trim any bushes or trees on the path to your home. Use bright outdoor lighting. Clear any walking paths of anything that might make someone trip, such as rocks or tools. Regularly check to see if handrails are loose or broken. Make sure that both sides of any steps have handrails. Any raised decks and porches should have guardrails on the edges. Have any leaves, snow, or ice cleared regularly. Use sand or salt on walking paths during winter. Clean up any spills in your garage right away. This includes oil or grease spills. What can I do in the bathroom? Use  night lights. Install grab bars by the toilet and in the tub and shower. Do not use towel bars as grab bars. Use non-skid mats or decals in the tub or shower. If you need to sit down in the shower, use a plastic, non-slip stool. Keep the floor dry. Clean up any water that spills on the floor as soon as it happens. Remove soap buildup in the tub or shower regularly. Attach bath mats securely with double-sided non-slip rug tape. Do not have throw rugs and other things on the floor that can make you trip. What can I do in the bedroom? Use night lights. Make sure that you have a light by your bed that is easy to reach. Do not use any sheets or blankets that are too big for your bed. They should not hang down onto the floor. Have a firm chair that has side arms. You can use this for support while you get dressed. Do not have throw rugs and other things on the floor that can make you trip. What can I do in the kitchen? Clean up any spills right away.  Avoid walking on wet floors. Keep items that you use a lot in easy-to-reach places. If you need to reach something above you, use a strong step stool that has a grab bar. Keep electrical cords out of the way. Do not use floor polish or wax that makes floors slippery. If you must use wax, use non-skid floor wax. Do not have throw rugs and other things on the floor that can make you trip. What can I do with my stairs? Do not leave any items on the stairs. Make sure that there are handrails on both sides of the stairs and use them. Fix handrails that are broken or loose. Make sure that handrails are as long as the stairways. Check any carpeting to make sure that it is firmly attached to the stairs. Fix any carpet that is loose or worn. Avoid having throw rugs at the top or bottom of the stairs. If you do have throw rugs, attach them to the floor with carpet tape. Make sure that you have a light switch at the top of the stairs and the bottom of the  stairs. If you do not have them, ask someone to add them for you. What else can I do to help prevent falls? Wear shoes that: Do not have high heels. Have rubber bottoms. Are comfortable and fit you well. Are closed at the toe. Do not wear sandals. If you use a stepladder: Make sure that it is fully opened. Do not climb a closed stepladder. Make sure that both sides of the stepladder are locked into place. Ask someone to hold it for you, if possible. Clearly mark and make sure that you can see: Any grab bars or handrails. First and last steps. Where the edge of each step is. Use tools that help you move around (mobility aids) if they are needed. These include: Canes. Walkers. Scooters. Crutches. Turn on the lights when you go into a dark area. Replace any light bulbs as soon as they burn out. Set up your furniture so you have a clear path. Avoid moving your furniture around. If any of your floors are uneven, fix them. If there are any pets around you, be aware of where they are. Review your medicines with your doctor. Some medicines can make you feel dizzy. This can increase your chance of falling. Ask your doctor what other things that you can do to help prevent falls. This information is not intended to replace advice given to you by your health care provider. Make sure you discuss any questions you have with your health care provider. Document Released: 09/01/2009 Document Revised: 04/12/2016 Document Reviewed: 12/10/2014 Elsevier Interactive Patient Education  2017 [employer].

## 2023-05-28 NOTE — Progress Notes (Addendum)
Subjective:   Meredith Butler is a 67 y.o. female who presents for Medicare Annual (Subsequent) preventive examination.  Visit Complete: Virtual  I connected with  Meredith Butler on 05/28/23 by a audio enabled telemedicine application and verified that I am speaking with the correct person using two identifiers.  Patient Location: Home  Provider Location: Office/Clinic  I discussed the limitations of evaluation and management by telemedicine. The patient expressed understanding and agreed to proceed.  Review of Systems     Cardiac Risk Factors include: advanced age (>35men, >34 women);diabetes mellitus;hypertension;family history of premature cardiovascular disease;sedentary lifestyle;dyslipidemia;obesity (BMI >30kg/m2)     Objective:    Today's Vitals   05/28/23 0813 05/28/23 0829  Weight:  224 lb (101.6 kg)  PainSc: 0-No pain    Body mass index is 34.06 kg/m.     05/28/2023    8:17 AM 07/11/2022    7:41 AM 07/02/2022    1:26 PM 05/24/2022   10:23 AM 01/05/2020    6:16 AM 01/14/2018    3:39 PM 04/04/2017    3:25 PM  Advanced Directives  Does Patient Have a Medical Advance Directive? No No No No No No No  Would patient like information on creating a medical advance directive? No - Patient declined  No - Patient declined No - Patient declined No - Patient declined No - Patient declined     Current Medications (verified) Outpatient Encounter Medications as of 05/28/2023  Medication Sig   albuterol (VENTOLIN HFA) 108 (90 Base) MCG/ACT inhaler 2 puffs every 6 hours as needed.   amLODipine (NORVASC) 5 MG tablet Take 1 tablet (5 mg total) by mouth daily.   aspirin EC 81 MG tablet Take 81 mg by mouth every evening.   Continuous Glucose Sensor (DEXCOM G7 SENSOR) MISC REPLACE SENSOR EVERY 10 DAYS, REPLACE TRANSMITTER EVERY 90 DAYS   EPINEPHrine 0.3 mg/0.3 mL IJ SOAJ injection Inject 0.3 mg into the muscle as needed for anaphylaxis.   ezetimibe (ZETIA) 10 MG tablet Take 1 tablet (10 mg total) by  mouth daily.   Insulin Pen Needle (NOVOFINE PLUS PEN NEEDLE) 32G X 4 MM MISC 1 each by Does not apply route once a week.   lisinopril (ZESTRIL) 20 MG tablet Take 1 tablet (20 mg total) by mouth daily.   lisinopril-hydrochlorothiazide (ZESTORETIC) 20-25 MG tablet Take 1 tablet by mouth daily.   Magnesium 250 MG TABS Take 250 mg by mouth daily as needed (leg cramps.).    metFORMIN (GLUCOPHAGE) 1000 MG tablet TAKE 1 TABLET (1,000 MG TOTAL) BY MOUTH TWICE A DAY WITH FOOD   ONETOUCH VERIO test strip USE TO CHECK BLOOD SUGAR ONCE DAILY   Semaglutide,0.25 or 0.5MG /DOS, (OZEMPIC, 0.25 OR 0.5 MG/DOSE,) 2 MG/1.5ML SOPN Inject 0.5 mg into the skin once a week. Start with 0.25MG  once a week x 4 weeks, then increase to 0.5MG  weekly.   No facility-administered encounter medications on file as of 05/28/2023.    Allergies (verified) Claforan [cefotaxime], Ibuprofen, Omnicef [cefdinir], Other, Penicillins, Sulfa antibiotics, Tetanus toxoids, Voltaren [diclofenac sodium], Codeine, Diovan [valsartan], Prednisone, Rocephin [ceftriaxone], Statins, and Streptococcus (diplococcus) pneumoniae [streptococci]   History: Past Medical History:  Diagnosis Date   Anxiety    Arthritis    Asthma    COPD (chronic obstructive pulmonary disease) (HCC)    Diabetes mellitus without complication (HCC)    Fibromyalgia    GERD (gastroesophageal reflux disease)    Hypertension    Peritonitis (HCC)    Pneumonia    Past Surgical  History:  Procedure Laterality Date   ABDOMINAL SURGERY     APPENDECTOMY     BREAST BIOPSY Left 04/10/2023   Stereo Bx, Ribbon Clip, Path Pending   BREAST BIOPSY Left 04/10/2023   MM LT BREAST BX W LOC DEV 1ST LESION IMAGE BX SPEC STEREO GUIDE 04/10/2023 [facility]   CHOLECYSTECTOMY     COLON SURGERY     IR RADIOLOGIST EVAL & MGMT  05/31/2022   IR RADIOLOGIST EVAL & MGMT  08/07/2022   IR RADIOLOGIST EVAL & MGMT  11/09/2022   JOINT REPLACEMENT Bilateral    knee   LEFT HEART CATH AND  CORONARY ANGIOGRAPHY N/A 01/05/2020   Procedure: LEFT HEART CATH AND CORONARY ANGIOGRAPHY;  Surgeon: Swaziland, Peter M, MD;  Location: [facility] INVASIVE CV LAB;  Service: Cardiovascular;  Laterality: N/A;   RADIOLOGY WITH ANESTHESIA N/A 07/11/2022   Procedure: Renal Cryo Ablation;  Surgeon: Gilmer Mor, DO;  Location: [facility];  Service: Radiology;  Laterality: N/A;   SMALL INTESTINE SURGERY     TONSILLECTOMY     TOTAL ABDOMINAL HYSTERECTOMY W/ BILATERAL SALPINGOOPHORECTOMY     complete   Family History  Problem Relation Age of Onset   Heart attack Mother    Cancer Mother    Diabetes Mother    Heart attack Father    Diabetes Sister    Diabetes Daughter    Cancer Maternal Grandmother        ovarian   Cancer Paternal Grandfather        colon   Cancer Sister        lung   Diabetes Daughter    Diabetes Maternal Aunt    Diabetes Maternal Uncle    Social History   Socioeconomic History   Marital status: Married    Spouse name: Not on file   Number of children: Not on file   Years of education: Not on file   Highest education level: Not on file  Occupational History   Not on file  Tobacco Use   Smoking status: Former    Packs/day: 2.00    Years: 30.00    Additional pack years: 0.00    Total pack years: 60.00    Types: Cigarettes    Quit date: 11/20/2009    Years since quitting: 13.5    Passive exposure: Past   Smokeless tobacco: Never  Vaping Use   Vaping Use: Never used  Substance and Sexual Activity   Alcohol use: No   Drug use: No   Sexual activity: Yes  Other Topics Concern   Not on file  Social History Narrative   Not on file   Social Determinants of Health   Financial Resource Strain: Low Risk  (05/28/2023)   Overall Financial Resource Strain (CARDIA)    Difficulty of Paying Living Expenses: Not very hard  Food Insecurity: No Food Insecurity (05/28/2023)   Hunger Vital Sign    Worried About Running Out of Food in the Last Year: Never true    Ran Out of Food in the  Last Year: Never true  Transportation Needs: No Transportation Needs (05/28/2023)   PRAPARE - Administrator, Civil Service (Medical): No    Lack of Transportation (Non-Medical): No  Physical Activity: Insufficiently Active (05/28/2023)   Exercise Vital Sign    Days of Exercise per Week: 1 day    Minutes of Exercise per Session: 60 min  Stress: No Stress Concern Present (05/28/2023)   [employer] of Occupational Health - Occupational Stress  Questionnaire    Feeling of Stress : Only a little  Social Connections: Moderately Integrated (05/28/2023)   Social Connection and Isolation Panel [NHANES]    Frequency of Communication with Friends and Family: More than three times a week    Frequency of Social Gatherings with Friends and Family: Once a week    Attends Religious Services: More than 4 times per year    Active Member of [employer] or Organizations: No    Attends Engineer, structural: Never    Marital Status: Married    Tobacco Counseling Counseling given: Not Answered   Clinical Intake:  Pre-visit preparation completed: Yes  Pain : No/denies pain Pain Score: 0-No pain     Nutritional Risks: None Diabetes: Yes CBG done?: No Did pt. bring in CBG monitor from home?: No  How often do you need to have someone help you when you read instructions, pamphlets, or other written materials from your doctor or pharmacy?: 1 - Never  Interpreter Needed?: No  Information entered by :: Kennedy Bucker, LPN   Activities of Daily Living    05/28/2023    8:19 AM 07/02/2022    1:23 PM  In your present state of health, do you have any difficulty performing the following activities:  Hearing? 0 1  Comment  slight hearing loss in both ears, no testing  Vision? 0 1  Comment  reading glasses  Difficulty concentrating or making decisions? 0 0  Walking or climbing stairs? 0 0  Dressing or bathing? 0 0  Doing errands, shopping? 0 0  Preparing Food and eating ? N   Using the  Toilet? N   In the past six months, have you accidently leaked urine? N   Do you have problems with loss of bowel control? N   Managing your Medications? N   Managing your Finances? N   Housekeeping or managing your Housekeeping? N     Patient Care Team: Larae Grooms, NP as PCP - General Tiburcio Pea, [facility], [facility] (Inactive) (Pharmacist) Lajean Manes, [facility] (Inactive) (Pharmacist)  Indicate any recent Medical Services you may have received from other than [facility] providers in the past year (date may be approximate).     Assessment:   This is a routine wellness examination for Rmani.  Hearing/Vision screen Hearing Screening - Comments:: Wears aids Vision Screening - Comments:: Readers- Dr.Cranford in [place]  Dietary issues and exercise activities discussed:     Goals Addressed             This Visit's Progress    DIET - EAT MORE FRUITS AND VEGETABLES         Depression Screen    05/28/2023    8:16 AM 05/07/2023   10:56 AM 02/15/2023   10:38 AM 12/17/2022    1:19 PM 11/01/2022   10:56 AM 08/08/2022    1:24 PM 05/24/2022   10:26 AM  PHQ 2/9 Scores  PHQ - 2 Score 0 0 0 0 1 1 3   PHQ- 9 Score 0 3 1 3 2 2 8     Fall Risk    05/28/2023    8:18 AM 05/07/2023   10:56 AM 02/15/2023   10:38 AM 12/17/2022    1:19 PM 11/01/2022   10:56 AM  Fall Risk   Falls in the past year? 1 0 0 0 1  Number falls in past yr: 0 0 0 0 0  Injury with Fall? 0 0 0 0 0  Risk for fall due to :  History of fall(s) No Fall Risks No Fall Risks No Fall Risks No Fall Risks  Follow up Falls prevention discussed;Falls evaluation completed Falls evaluation completed Falls evaluation completed Falls evaluation completed Falls evaluation completed    MEDICARE RISK AT HOME:  Medicare Risk at Home - 05/28/23 0819     Any stairs in or around the home? Yes    If so, are there any without handrails? No    Home free of loose throw rugs in walkways, pet beds, electrical cords, etc? Yes    Adequate lighting in your  home to reduce risk of falls? Yes    Life alert? No    Use of a cane, walker or w/c? Yes   uses walking stick if going on hike   Grab bars in the bathroom? Yes    Shower chair or bench in shower? Yes    Elevated toilet seat or a handicapped toilet? No             TIMED UP AND GO:  Was the test performed?  No    Cognitive Function:        05/28/2023    8:20 AM 05/24/2022   10:04 AM  6CIT Screen  What Year? 0 points 0 points  What month? 0 points 0 points  What time? 0 points 0 points  Count back from 20 0 points 0 points  Months in reverse 0 points 0 points  Repeat phrase 0 points 0 points  Total Score 0 points 0 points    Immunizations  There is no immunization history on file for this patient.  TDAP status: Due, Education has been provided regarding the importance of this vaccine. Advised may receive this vaccine at local pharmacy or Health Dept. Aware to provide a copy of the vaccination record if obtained from local pharmacy or Health Dept. Verbalized acceptance and understanding.  Flu Vaccine status: Declined, Education has been provided regarding the importance of this vaccine but patient still declined. Advised may receive this vaccine at local pharmacy or Health Dept. Aware to provide a copy of the vaccination record if obtained from local pharmacy or Health Dept. Verbalized acceptance and understanding.  Pneumococcal vaccine status: Declined,  Education has been provided regarding the importance of this vaccine but patient still declined. Advised may receive this vaccine at local pharmacy or Health Dept. Aware to provide a copy of the vaccination record if obtained from local pharmacy or Health Dept. Verbalized acceptance and understanding.   Covid-19 vaccine status: Declined, Education has been provided regarding the importance of this vaccine but patient still declined. Advised may receive this vaccine at local pharmacy or Health Dept.or vaccine clinic. Aware to  provide a copy of the vaccination record if obtained from local pharmacy or Health Dept. Verbalized acceptance and understanding.  Qualifies for Shingles Vaccine? Yes   Zostavax completed No   Shingrix Completed?: No.    Education has been provided regarding the importance of this vaccine. Patient has been advised to call insurance company to determine out of pocket expense if they have not yet received this vaccine. Advised may also receive vaccine at local pharmacy or Health Dept. Verbalized acceptance and understanding.  Screening Tests Health Maintenance  Topic Date Due   COVID-19 Vaccine (1) Never done   Zoster Vaccines- Shingrix (1 of 2) Never done   DEXA SCAN  Never done   Pneumonia Vaccine 57+ Years old (1 of 2 - PCV) 11/02/2023 (Originally 04/28/1962)   INFLUENZA VACCINE  06/20/2023  Diabetic kidney evaluation - Urine ACR  11/02/2023   FOOT EXAM  11/02/2023   HEMOGLOBIN A1C  11/06/2023   OPHTHALMOLOGY EXAM  11/30/2023   Lung Cancer Screening  02/29/2024   MAMMOGRAM  03/26/2024   Diabetic kidney evaluation - eGFR measurement  05/06/2024   Medicare Annual Wellness (AWV)  05/27/2024   Fecal DNA (Cologuard)  10/22/2025   Hepatitis C Screening  Completed   HPV VACCINES  Aged Out   DTaP/Tdap/Td  Discontinued    Health Maintenance  Health Maintenance Due  Topic Date Due   COVID-19 Vaccine (1) Never done   Zoster Vaccines- Shingrix (1 of 2) Never done   DEXA SCAN  Never done    Colorectal cancer screening: Type of screening: Cologuard. Completed 10/22/22. Repeat every 3 years  Mammogram status: Completed 03/27/23. Repeat every year  Declined [facility] referral  Lung Cancer Screening: (Low Dose CT Chest recommended if Age 20-80 years, 20 pack-year currently smoking OR have quit w/in 15years.) does qualify.   Lung Cancer Screening Referral: ORDERED 03/05/23  Additional Screening:  Hepatitis C Screening: does qualify; Completed 11/01/22  Vision Screening: Recommended annual  ophthalmology exams for early detection of glaucoma and other disorders of the eye. Is the patient up to date with their annual eye exam?  Yes  Who is the provider or what is the name of the office in which the patient attends annual eye exams? Dr.Cranford in [place] If pt is not established with a provider, would they like to be referred to a provider to establish care? No .   Dental Screening: Recommended annual dental exams for proper oral hygiene  Diabetic Foot Exam: Diabetic Foot Exam: Completed 11/01/22  Community Resource Referral / Chronic Care Management: CRR required this visit?  No   CCM required this visit?  No     Plan:     I have personally reviewed and noted the following in the patient's chart:   Medical and social history Use of alcohol, tobacco or illicit drugs  Current medications and supplements including opioid prescriptions. Patient is not currently taking opioid prescriptions. Functional ability and status Nutritional status Physical activity Advanced directives List of other physicians Hospitalizations, surgeries, and ER visits in previous 12 months Vitals Screenings to include cognitive, depression, and falls Referrals and appointments  In addition, I have reviewed and discussed with patient certain preventive protocols, quality metrics, and best practice recommendations. A written personalized care plan for preventive services as well as general preventive health recommendations were provided to patient.     Hal Hope, LPN   11/24/1094   After Visit Summary: (MyChart) Due to this being a telephonic visit, the after visit summary with patients personalized plan was offered to patient via MyChart   Nurse Notes: none

## 2023-06-25 ENCOUNTER — Other Ambulatory Visit: Payer: Self-pay | Admitting: Nurse Practitioner

## 2023-06-25 NOTE — Telephone Encounter (Signed)
Requested Prescriptions  Pending Prescriptions Disp Refills   Continuous Glucose Sensor (DEXCOM G7 SENSOR) MISC [Pharmacy Med Name: DEXCOM G7 SENSOR] 3 each 0    Sig: REPLACE SENSOR EVERY 10 DAYS, REPLACE TRANSMITTER EVERY 90 DAYS     Endocrinology: Diabetes - Testing Supplies Passed - 06/25/2023  2:11 AM      Passed - Valid encounter within last 12 months    Recent Outpatient Visits           1 month ago Hypertension associated with diabetes [facility])   [place] [facility] Larae Grooms, NP   4 months ago Aortic atherosclerosis [facility])   [place] [facility] Larae Grooms, NP   6 months ago Type 2 diabetes mellitus with morbid obesity [facility])   [place] [facility] Larae Grooms, NP   7 months ago Depression, recurrent [facility])   [place] [facility] Larae Grooms, NP   10 months ago Acute cystitis with hematuria    [place] [facility] Larae Grooms, NP       Future Appointments             In 2 months Larae Grooms, NP  [facility]

## 2023-07-27 ENCOUNTER — Other Ambulatory Visit: Payer: Self-pay | Admitting: Physician Assistant

## 2023-07-27 DIAGNOSIS — E1169 Type 2 diabetes mellitus with other specified complication: Secondary | ICD-10-CM

## 2023-07-29 NOTE — Telephone Encounter (Signed)
Requested Prescriptions  Pending Prescriptions Disp Refills   Continuous Glucose Sensor (DEXCOM G7 SENSOR) MISC [Pharmacy Med Name: DEXCOM G7 SENSOR] 9 each 1    Sig: REPLACE SENSOR EVERY 10 DAYS, REPLACE TRANSMITTER EVERY 90 DAYS     Endocrinology: Diabetes - Testing Supplies Passed - 07/27/2023 10:06 AM      Passed - Valid encounter within last 12 months    Recent Outpatient Visits           2 months ago Hypertension associated with diabetes [facility])   [place] [facility] Larae Grooms, NP   5 months ago Aortic atherosclerosis [facility])   [place] [facility] Larae Grooms, NP   7 months ago Type 2 diabetes mellitus with morbid obesity [facility])   [place] [facility] Larae Grooms, NP   9 months ago Depression, recurrent [facility])   [place] [facility] Larae Grooms, NP   11 months ago Acute cystitis with hematuria   [place] [facility] Larae Grooms, NP       Future Appointments             In 1 month Larae Grooms, NP  [facility]

## 2023-08-12 ENCOUNTER — Ambulatory Visit: Payer: Self-pay

## 2023-08-12 NOTE — Telephone Encounter (Signed)
Chief Complaint: Sinus congestion Symptoms: eye and nose pressure, congestion, earache, sore throat Frequency: constant Pertinent Negatives: Patient denies chest pain, SOB Disposition: [] ED /[] Urgent Care (no appt availability in office) / [x] Appointment(In office/virtual)/ []  [place] [facility]/ [] Home Care/ [] Refused Recommended Disposition /[] [place] [facility]/ []  Follow-up with PCP Additional Notes: Patient states she has sinus pressure around her eyes and nose. Patient states she has tried over the counter treatment with no improvement of symptoms. 2 days ago she also started having a sore throat, earache and nasal congestion. Patient states she just doesn't feel well. Care advice given and patient has been scheduled for an OV on 08/14/23. Advised patient if symptoms get worse to callback. Patient also states she can not access her MyChart account due to limited internet access. Patient states her Dexcom and glucose meter readings are usually a 15-20 difference and wanted to know if she needed a [place] Dexcom?

## 2023-08-12 NOTE — Telephone Encounter (Signed)
Third attempt to reach pt. Left message to call back.

## 2023-08-12 NOTE — Telephone Encounter (Signed)
Summary: head congestion, Question UTI   Head congestion, dry eyes, burning when urinating  (3-7 days)      Left message to call back about symptoms.

## 2023-08-12 NOTE — Telephone Encounter (Signed)
Second attempt to reach pt. Left message to call back about symptoms.

## 2023-08-12 NOTE — Telephone Encounter (Signed)
Reason for Disposition . Earache  Answer Assessment - Initial Assessment Questions 1. LOCATION: "Where does it hurt?"      Around my eyes and nose 2. ONSET: "When did the sinus pain start?"  (e.g., hours, days)      Onset about 1 week ago 3. SEVERITY: "How bad is the pain?"   (Scale 1-10; mild, moderate or severe)   - MILD (1-3): doesn't interfere with normal activities    - MODERATE (4-7): interferes with normal activities (e.g., work or school) or awakens from sleep   - SEVERE (8-10): excruciating pain and patient unable to do any normal activities        8/10 4. RECURRENT SYMPTOM: "Have you ever had sinus problems before?" If Yes, ask: "When was the last time?" and "What happened that time?"      Yes, I got antibiotics  5. NASAL CONGESTION: "Is the nose blocked?" If Yes, ask: "Can you open it or must you breathe through your mouth?"     Yes and it feels dry  6. NASAL DISCHARGE: "Do you have discharge from your nose?" If so ask, "What color?"     Sometimes it is green  7. FEVER: "Do you have a fever?" If Yes, ask: "What is it, how was it measured, and when did it start?"      I don't know 8. OTHER SYMPTOMS: "Do you have any other symptoms?" (e.g., sore throat, cough, earache, difficulty breathing)     Earache, sore throat, burning with urination  Protocols used: Sinus Pain or Congestion-A-AH

## 2023-08-14 ENCOUNTER — Other Ambulatory Visit: Payer: Self-pay | Admitting: Pediatrics

## 2023-08-14 ENCOUNTER — Ambulatory Visit (INDEPENDENT_AMBULATORY_CARE_PROVIDER_SITE_OTHER): Payer: BC Managed Care – PPO | Admitting: Pediatrics

## 2023-08-14 ENCOUNTER — Encounter: Payer: Self-pay | Admitting: Pediatrics

## 2023-08-14 VITALS — BP 121/81 | HR 90 | Temp 97.6°F | Ht 68.0 in | Wt 227.0 lb

## 2023-08-14 DIAGNOSIS — J019 Acute sinusitis, unspecified: Secondary | ICD-10-CM

## 2023-08-14 DIAGNOSIS — R051 Acute cough: Secondary | ICD-10-CM | POA: Diagnosis not present

## 2023-08-14 DIAGNOSIS — K219 Gastro-esophageal reflux disease without esophagitis: Secondary | ICD-10-CM

## 2023-08-14 DIAGNOSIS — R350 Frequency of micturition: Secondary | ICD-10-CM | POA: Diagnosis not present

## 2023-08-14 DIAGNOSIS — N898 Other specified noninflammatory disorders of vagina: Secondary | ICD-10-CM | POA: Diagnosis not present

## 2023-08-14 DIAGNOSIS — B9689 Other specified bacterial agents as the cause of diseases classified elsewhere: Secondary | ICD-10-CM

## 2023-08-14 DIAGNOSIS — T782XXS Anaphylactic shock, unspecified, sequela: Secondary | ICD-10-CM

## 2023-08-14 LAB — URINALYSIS, ROUTINE W REFLEX MICROSCOPIC
Bilirubin, UA: NEGATIVE
Glucose, UA: NEGATIVE
Ketones, UA: NEGATIVE
Leukocytes,UA: NEGATIVE
Nitrite, UA: NEGATIVE
Protein,UA: NEGATIVE
Specific Gravity, UA: >1.03 — ABNORMAL HIGH (ref 1.005–1.030)
Urobilinogen, Ur: 0.2 mg/dL (ref 0.2–1.0)
pH, UA: 5.5 (ref 5.0–7.5)

## 2023-08-14 LAB — MICROSCOPIC EXAMINATION

## 2023-08-14 LAB — VERITOR FLU A/B WAIVED
Influenza A: NEGATIVE
Influenza B: NEGATIVE

## 2023-08-14 LAB — WET PREP FOR TRICH, YEAST, CLUE
Clue Cell Exam: POSITIVE — AB
Trichomonas Exam: NEGATIVE
Yeast Exam: NEGATIVE

## 2023-08-14 MED ORDER — AZITHROMYCIN 500 MG PO TABS
500.0000 mg | ORAL_TABLET | Freq: Every day | ORAL | 0 refills | Status: AC
Start: 2023-08-14 — End: 2023-08-17

## 2023-08-14 MED ORDER — PANTOPRAZOLE SODIUM 40 MG PO TBEC: 40.0000 mg | DELAYED_RELEASE_TABLET | Freq: Every day | ORAL | 3 refills | Status: DC

## 2023-08-14 MED ORDER — EPINEPHRINE 0.3 MG/0.3ML IJ SOAJ: 0.3000 mg | INTRAMUSCULAR | 2 refills | Status: DC | PRN

## 2023-08-14 MED ORDER — FLUCONAZOLE 150 MG PO TABS
150.0000 mg | ORAL_TABLET | Freq: Once | ORAL | 0 refills | Status: AC
Start: 2023-08-14 — End: 2023-08-14

## 2023-08-14 MED ORDER — METRONIDAZOLE 500 MG PO TABS
500.0000 mg | ORAL_TABLET | Freq: Two times a day (BID) | ORAL | 0 refills | Status: AC
Start: 2023-08-14 — End: 2023-08-21

## 2023-08-14 NOTE — Patient Instructions (Addendum)
Pantoprazole for acid reflux, take 30-45 min before meals  Azithromycin 500mg  once daily for 3 days for sinus infection  I'll let you know results of urine, if another antibiotic is needed.  I sent 1 dose of diflucan to take after you complete antibiotics.  Most cold symptoms last up to 2 weeks, but cough can sometimes linger up to 4 weeks.  However if your symtpoms get WORSE - like you develop fevers or get more shortness of breath, then call your clinic as you may need to be evaluated.   Aches and Pains Acetaminophen (Tylenol): 1000mg  ("extra strength" tablets are 500mg , so take 2) every 8 hours if needed   Sore Throat:  See Aches and Pains meds above, also Sore throat sprays and lozenges may also help.   Cough:  Honey 2 TBS every 4-6 hours if needed.  Robitussin DM syrup or generic equivalent which has (guaifenesin = an expectorant to help you get stuff up + dextromethorphan (DM) = cough supressant). You can also get this in tablet formula (like Mucinex DM or generic equivalent).  If you have asthma or are wheezing and have a tight chest, then albuterol inhaler (Ventolin, ProAir) may be helpful - you need a prescription for this.   Congestion:  oxymetazoline (Afrin) nasal stray: 2 sprays each nostril every 12 hours. Don't use more than 7 days in a row to avoid building a tolerance to it.  Sinus rinse (neti pot) high volume sinus rinse can help open up your sinuses and be helpful, especially if you're having sinus pressure and headaches.   Other:  Umcka (pelargonium sidoides extract) can to shorten cold symptoms (can be hard to find, but [employer] carries it: brand name Umcka ColdCare from [employer]). Works best if you start taking at earliest signs of cold symptoms.  Andrographis paniculata is another herbal remedy with less evidence, but may reduce common cold symptoms in adults.  zinc acetate lozenges >= 80 mg/day reduces duration but not severity of cold symptoms in adults,  but it is associated with bad taste and nausea Heated humidified air may reduce cold symptoms, so try using a humidifier - especially in your bedroom at night.  Stay hydrated! Aim to drink at least 2 liters of water daily.   What doesn't work (but lots of folks think might) Vitamin C: bummer right?! But there's no evidence that high dose vitamin C will help cold symptoms. Antibiotics: colds come from viruses - which antibiotics don't kill . . . But they will kill your health/natural bacteria in your body and can build resistant 'super bugs'

## 2023-08-14 NOTE — Progress Notes (Signed)
Acute Visit  BP 121/81   Pulse 90   Temp 97.6 F (36.4 C) (Oral)   Ht 5\' 8"  (1.727 m)   Wt 227 lb (103 kg)   SpO2 96%   BMI 34.52 kg/m    Subjective:    Patient ID: Meredith Butler, female    DOB: [birth year], 67 y.o.   MRN: [id number]  HPI: Meredith Butler is a 67 y.o. female  Chief Complaint  Patient presents with   Headache    Facial pressure,   Nasal Congestion   Cough    Slight cough, sx on going for a week   Dysuria   Urinary Frequency    Urine sx for a week, was taking Azo but stopped x3 days ago along with odor   Started with congestion and ear pain a week ago Grandson had strep throat She then developed cough Since then has worsened, most bothered by sinus pressure that has worsened over the last week Has yellow green mucous, headaches, using tylenol and ASA prn (allergic to ibuprofen) Has had hearing changes, feels like she's on an airplane Cough, sputum clear No fevers, shivers  Urinary frequency Has had urinary frequency as for a week Took azo but has had persistent symptoms Strange odor No back pain, no nausea, vomiting No blood in urine, no burning H/o recurrent UTIs per patient ?vaginal discharge, did not want to discuss in detail  Relevant past medical, surgical, family and social history reviewed and updated as indicated. Interim medical history since our last visit reviewed. Allergies and medications reviewed and updated.  ROS per HPI unless specifically indicated above     Objective:    BP 121/81   Pulse 90   Temp 97.6 F (36.4 C) (Oral)   Ht 5\' 8"  (1.727 m)   Wt 227 lb (103 kg)   SpO2 96%   BMI 34.52 kg/m   Wt Readings from Last 3 Encounters:  08/14/23 227 lb (103 kg)  05/28/23 224 lb (101.6 kg)  05/07/23 224 lb 9.6 oz (101.9 kg)     Physical Exam Constitutional:      Appearance: Normal appearance.  HENT:     Head: Normocephalic and atraumatic.     Right Ear: Hearing, tympanic membrane and ear canal normal. Tympanic membrane is not  erythematous.     Left Ear: Hearing, tympanic membrane and ear canal normal. Tympanic membrane is not erythematous.     Nose:     Right Turbinates: Enlarged and swollen.     Left Turbinates: Enlarged and swollen.     Right Sinus: Maxillary sinus tenderness present.     Left Sinus: Maxillary sinus tenderness present.  Eyes:     Pupils: Pupils are equal, round, and reactive to light.  Cardiovascular:     Rate and Rhythm: Normal rate and regular rhythm.     Pulses: Normal pulses.     Heart sounds: Normal heart sounds.  Pulmonary:     Effort: Pulmonary effort is normal.     Breath sounds: Normal breath sounds.  Abdominal:     General: Abdomen is flat.     Palpations: Abdomen is soft.  Musculoskeletal:        General: Normal range of motion.     Cervical back: Normal range of motion.  Skin:    General: Skin is warm and dry.     Capillary Refill: Capillary refill takes less than 2 seconds.  Neurological:     General: No focal deficit present.  Mental Status: She is alert. Mental status is at baseline.  Psychiatric:        Mood and Affect: Mood normal.        Behavior: Behavior normal.       Assessment & Plan:  Assessment & Plan   Acute cough Will r/o viral illnesses below. Well appearing on exam. Continue conservative management. Pt given strict return precautions and anticipatory guidance. Continue prn albuterol (chronic med). -     Novel Coronavirus, NAA ([facility]); Future -     Veritor Flu A/B Waived; Future  Urinary frequency Assessment & Plan: [place] problem. Noted to have recurrent UTIs. Plan to look at culture data. May benefit from urogyn referral.   Orders: -     Urine Culture; Future -     Urinalysis, Routine w reflex microscopic; Future  Vaginal discharge Will r/o BV and yeast. Given fluconazole to take after abx. -     WET PREP FOR TRICH, YEAST, CLUE -     Fluconazole; Take 1 tablet (150 mg total) by mouth once for 1 dose.  Dispense: 1 tablet; Refill:  0  Acute non-recurrent sinusitis, unspecified location Suspect bacterial sinusitis based on symptoms and exam. Will treat with azithromycin given penicillin and sulfa allergy. -     Azithromycin; Take 1 tablet (500 mg total) by mouth daily for 3 days.  Dispense: 3 tablet; Refill: 0  Gastroesophageal reflux disease, unspecified whether esophagitis present Noted to start while she has been sick. Will use PPI below as needed. -     Pantoprazole Sodium; Take 1 tablet (40 mg total) by mouth daily.  Dispense: 30 tablet; Refill: 3  Anaphylaxis, sequela Multiple allergies. Unsure if has epi pen at home. Sent refill. -     EPINEPHrine; Inject 0.3 mg into the muscle as needed for anaphylaxis.  Dispense: 2 each; Refill: 2    Follow up plan: Return if symptoms worsen or fail to improve.  Gladyes Kudo Howell Pringle, MD

## 2023-08-14 NOTE — Assessment & Plan Note (Signed)
Debow problem. Noted to have recurrent UTIs. Plan to look at culture data. May benefit from urogyn referral.

## 2023-08-16 ENCOUNTER — Ambulatory Visit: Payer: Self-pay | Admitting: *Deleted

## 2023-08-16 ENCOUNTER — Other Ambulatory Visit: Payer: Self-pay | Admitting: Nurse Practitioner

## 2023-08-16 DIAGNOSIS — N3000 Acute cystitis without hematuria: Secondary | ICD-10-CM

## 2023-08-16 DIAGNOSIS — B3731 Acute candidiasis of vulva and vagina: Secondary | ICD-10-CM

## 2023-08-16 LAB — NOVEL CORONAVIRUS, NAA: SARS-CoV-2, NAA: NOT DETECTED

## 2023-08-16 MED ORDER — FLUCONAZOLE 150 MG PO TABS
150.0000 mg | ORAL_TABLET | Freq: Once | ORAL | 0 refills | Status: AC
Start: 2023-08-16 — End: 2023-08-16

## 2023-08-16 MED ORDER — NITROFURANTOIN MONOHYD MACRO 100 MG PO CAPS
100.0000 mg | ORAL_CAPSULE | Freq: Two times a day (BID) | ORAL | 0 refills | Status: AC
Start: 2023-08-16 — End: 2023-08-21

## 2023-08-16 NOTE — Telephone Encounter (Signed)
  Chief Complaint: medications prescribed. And recent results. Diflucan, nitrofurantoin, metronidazole, azithromycin, waiting for pick up at pharmacy.  Symptoms: did not know what medications prescribed for  Frequency: today Pertinent Negatives: Patient denies na  Disposition: [] ED /[] Urgent Care (no appt availability in office) / [] Appointment(In office/virtual)/ []  [place] [facility]/ [] Home Care/ [] Refused Recommended Disposition /[] [place] [facility]/ [x]  Follow-up with PCP Additional Notes:   Requesting messages not be sent via My Chart message due to "no service".  Patient can get into my chart but when at home no service to get into my chart. Reviewed results with patient .  Pt given lab results per notes of [facility] from 08/14/23 on 08/16/23. Pt verbalized understanding ,when medications reviewed by NT and rationale . Patient requesting if she can take all but diflucan together. Reviewed possible GI upset and to review with pharmacists when picking up meds.        Reason for Disposition  [1] Caller has NON-URGENT medicine question about med that PCP prescribed AND [2] triager unable to answer question  Answer Assessment - Initial Assessment Questions 1. NAME of MEDICINE: "What medicine(s) are you calling about?"     Azithromycin, diflucan, nitrofurantoin and metrodinazole 2. QUESTION: "What is your question?" (e.g., double dose of medicine, side effect)     What are medications for and does patient take all of them?  3. PRESCRIBER: "Who prescribed the medicine?" Reason: if prescribed by specialist, call should be referred to that group.     Dr. Evelene Croon  4. SYMPTOMS: "Do you have any symptoms?" If Yes, ask: "What symptoms are you having?"  "How bad are the symptoms (e.g., mild, moderate, severe)     See last OV notes 5. PREGNANCY:  "Is there any chance that you are pregnant?" "When was your last menstrual period?"     na  Protocols used: Medication Question Call-A-AH

## 2023-08-17 LAB — URINE CULTURE

## 2023-09-10 ENCOUNTER — Ambulatory Visit: Payer: Medicare Other | Admitting: Nurse Practitioner

## 2023-10-08 ENCOUNTER — Ambulatory Visit (INDEPENDENT_AMBULATORY_CARE_PROVIDER_SITE_OTHER): Payer: BC Managed Care – PPO | Admitting: Family Medicine

## 2023-10-08 ENCOUNTER — Ambulatory Visit: Payer: Self-pay

## 2023-10-08 ENCOUNTER — Encounter: Payer: Self-pay | Admitting: Family Medicine

## 2023-10-08 VITALS — BP 110/73 | HR 87 | Temp 98.9°F | Resp 16 | Wt 224.4 lb

## 2023-10-08 DIAGNOSIS — J441 Chronic obstructive pulmonary disease with (acute) exacerbation: Secondary | ICD-10-CM | POA: Diagnosis not present

## 2023-10-08 LAB — VERITOR FLU A/B WAIVED
Influenza A: NEGATIVE
Influenza B: NEGATIVE

## 2023-10-08 MED ORDER — ALBUTEROL SULFATE (2.5 MG/3ML) 0.083% IN NEBU
2.5000 mg | INHALATION_SOLUTION | Freq: Once | RESPIRATORY_TRACT | Status: AC
Start: 2023-10-08 — End: ?

## 2023-10-08 MED ORDER — PREDNISONE 20 MG PO TABS
40.0000 mg | ORAL_TABLET | Freq: Every day | ORAL | 0 refills | Status: DC
Start: 2023-10-08 — End: 2023-10-30

## 2023-10-08 MED ORDER — TRIAMCINOLONE ACETONIDE 40 MG/ML IJ SUSP
40.0000 mg | Freq: Once | INTRAMUSCULAR | Status: AC
Start: 2023-10-08 — End: 2023-10-08
  Administered 2023-10-08: 40 mg via INTRAMUSCULAR

## 2023-10-08 MED ORDER — AZITHROMYCIN 250 MG PO TABS: ORAL_TABLET | ORAL | 0 refills | Status: AC

## 2023-10-08 MED ORDER — FLUCONAZOLE 150 MG PO TABS: 150.0000 mg | ORAL_TABLET | Freq: Every day | ORAL | 0 refills | Status: DC

## 2023-10-08 NOTE — Progress Notes (Signed)
BP 110/73 (BP Location: Left Arm, Patient Position: Sitting, Cuff Size: Large)   Pulse 87   Temp 98.9 F (37.2 C) (Oral)   Resp 16   Wt 224 lb 6.4 oz (101.8 kg)   SpO2 97%   BMI 34.12 kg/m    Subjective:    Patient ID: Meredith Butler, female    DOB: [birth year], 67 y.o.   MRN: [id number]  HPI: Meredith Butler is a 67 y.o. female  Chief Complaint  Patient presents with   URI    Sunday, no medications. Chest pain and pressure/heaviness. Coughing and sneezing, runny nose and congestion/dryness. Sore throat as well.    UPPER RESPIRATORY TRACT INFECTION Worst symptom: throat and chest  Fever: no Cough: yes Shortness of breath: yes Wheezing: yes Chest pain: yes Chest tightness: yes Chest congestion: yes Nasal congestion: yes Runny nose: yes Post nasal drip: yes Sneezing: no Sore throat: yes Swollen glands: no Sinus pressure: yes Headache: no Face pain: no Toothache: no Ear pain: no  Ear pressure: yes "right Eyes red/itching:no Eye drainage/crusting: no  Vomiting: no Rash: no Fatigue: yes Sick contacts: no Strep contacts: no  Context: worse Recurrent sinusitis: no Relief with OTC cold/cough medications: no  Treatments attempted: anti-histamine    Relevant past medical, surgical, family and social history reviewed and updated as indicated. Interim medical history since our last visit reviewed. Allergies and medications reviewed and updated.  Review of Systems  Constitutional: Negative.   HENT: Negative.    Respiratory:  Positive for cough, chest tightness, shortness of breath and wheezing. Negative for apnea, choking and stridor.   Cardiovascular: Negative.   Gastrointestinal: Negative.   Skin: Negative.   Psychiatric/Behavioral: Negative.      Per HPI unless specifically indicated above     Objective:    BP 110/73 (BP Location: Left Arm, Patient Position: Sitting, Cuff Size: Large)   Pulse 87   Temp 98.9 F (37.2 C) (Oral)   Resp 16   Wt 224 lb 6.4 oz  (101.8 kg)   SpO2 97%   BMI 34.12 kg/m   Wt Readings from Last 3 Encounters:  10/08/23 224 lb 6.4 oz (101.8 kg)  08/14/23 227 lb (103 kg)  05/28/23 224 lb (101.6 kg)    Physical Exam Vitals and nursing note reviewed.  Constitutional:      General: She is not in acute distress.    Appearance: Normal appearance. She is not ill-appearing, toxic-appearing or diaphoretic.  HENT:     Head: Normocephalic and atraumatic.     Right Ear: Tympanic membrane, ear canal and external ear normal. There is no impacted cerumen.     Left Ear: Tympanic membrane, ear canal and external ear normal. There is no impacted cerumen.     Nose: Rhinorrhea present. No congestion.     Mouth/Throat:     Mouth: Mucous membranes are moist.     Pharynx: Oropharynx is clear. No oropharyngeal exudate or posterior oropharyngeal erythema.  Eyes:     General: No scleral icterus.       Right eye: No discharge.        Left eye: No discharge.     Extraocular Movements: Extraocular movements intact.     Conjunctiva/sclera: Conjunctivae normal.     Pupils: Pupils are equal, round, and reactive to light.  Cardiovascular:     Rate and Rhythm: Normal rate and regular rhythm.     Pulses: Normal pulses.     Heart sounds: Normal heart sounds. No murmur heard.  No friction rub. No gallop.  Pulmonary:     Effort: Pulmonary effort is normal. No respiratory distress.     Breath sounds: No stridor. Wheezing and rhonchi present. No rales.  Chest:     Chest wall: No tenderness.  Musculoskeletal:        General: Normal range of motion.     Cervical back: Normal range of motion and neck supple.  Skin:    General: Skin is warm and dry.     Capillary Refill: Capillary refill takes less than 2 seconds.     Coloration: Skin is not jaundiced or pale.     Findings: No bruising, erythema, lesion or rash.  Neurological:     General: No focal deficit present.     Mental Status: She is alert and oriented to person, place, and time.  Mental status is at baseline.  Psychiatric:        Mood and Affect: Mood normal.        Behavior: Behavior normal.        Thought Content: Thought content normal.        Judgment: Judgment normal.     Results for orders placed or performed in visit on 08/14/23  Urine Culture   Specimen: Urine   UR  Result Value Ref Range   Urine Culture, Routine Final report (A)    Organism ID, Bacteria Escherichia coli (A)    Antimicrobial Susceptibility Comment   WET PREP FOR TRICH, YEAST, CLUE   Specimen: Vaginal Swab   Vaginal Swab  Result Value Ref Range   Trichomonas Exam Negative Negative   Yeast Exam Negative Negative   Clue Cell Exam Positive (A) Negative  Novel Coronavirus, NAA ([facility])   Specimen: Nasopharyngeal(NP) swabs in vial transport medium  Result Value Ref Range   SARS-CoV-2, NAA Not Detected Not Detected  Microscopic Examination   Urine  Result Value Ref Range   WBC, UA 0-5 0 - 5 /hpf   RBC, Urine 0-2 0 - 2 /hpf   Epithelial Cells (non renal) 0-10 0 - 10 /hpf   Mucus, UA Present (A) Not Estab.   Bacteria, UA Many (A) None seen/Few  Urinalysis, Routine w reflex microscopic  Result Value Ref Range   Specific Gravity, UA >1.030 (H) 1.005 - 1.030   pH, UA 5.5 5.0 - 7.5   Color, UA Yellow Yellow   Appearance Ur Cloudy (A) Clear   Leukocytes,UA Negative Negative   Protein,UA Negative Negative/Trace   Glucose, UA Negative Negative   Ketones, UA Negative Negative   RBC, UA Trace (A) Negative   Bilirubin, UA Negative Negative   Urobilinogen, Ur 0.2 0.2 - 1.0 mg/dL   Nitrite, UA Negative Negative   Microscopic Examination See below:   Veritor Flu A/B Waived  Result Value Ref Range   Influenza A Negative Negative   Influenza B Negative Negative      Assessment & Plan:   Problem List Items Addressed This Visit   None Visit Diagnoses     COPD exacerbation (HCC)    -  Primary   Better following neb and triamcinalone. Will treat with prednisone and azithromycin.  Recheck in 2 weeks. Call with any concerns.   Relevant Medications   triamcinolone acetonide (KENALOG-40) injection 40 mg   albuterol (PROVENTIL) (2.5 MG/3ML) 0.083% nebulizer solution 2.5 mg   azithromycin (ZITHROMAX) 250 MG tablet   predniSONE (DELTASONE) 20 MG tablet   Other Relevant Orders   Novel Coronavirus, NAA ([facility])   Veritor  Flu A/B Waived        Follow up plan: Return in about 2 weeks (around 10/22/2023) for with me or Clydie Braun for lung recheck.

## 2023-10-08 NOTE — Telephone Encounter (Signed)
  Chief Complaint: Wheezing, sore throat Symptoms: above Frequency: Sunday - monday Pertinent Negatives: Patient denies  Disposition: [] ED /[] Urgent Care (no appt availability in office) / [x] Appointment(In office/virtual)/ []  [place] [facility]/ [] Home Care/ [] Refused Recommended Disposition /[]  Mobile Bus/ []  Follow-up with PCP Additional Notes: Spoke with Meredith Butler's husband, Meredith Butler at his side. Meredith Butler has COPD. Started feeling poorly Sunday. Now is wheezing, sore throat difficult to speak. Took 9 am appt with Dr. Laural Benes. May be late.     Reason for Disposition  [1] Longstanding difficulty breathing (e.g., CHF, COPD, emphysema) AND [2] WORSE than normal  Answer Assessment - Initial Assessment Questions 1. RESPIRATORY STATUS: "Describe your breathing?" (e.g., wheezing, shortness of breath, unable to speak, severe coughing)      wheezing 2. ONSET: "When did this breathing problem begin?"      Yesterday - perhaps Sunday 3. PATTERN "Does the difficult breathing come and go, or has it been constant since it started?"      Constant 4. SEVERITY: "How bad is your breathing?" (e.g., mild, moderate, severe)    - MILD: No SOB at rest, mild SOB with walking, speaks normally in sentences, can lie down, no retractions, pulse < 100.    - MODERATE: SOB at rest, SOB with minimal exertion and prefers to sit, cannot lie down flat, speaks in phrases, mild retractions, audible wheezing, pulse 100-120.    - SEVERE: Very SOB at rest, speaks in single words, struggling to breathe, sitting hunched forward, retractions, pulse > 120      Moderate 5. RECURRENT SYMPTOM: "Have you had difficulty breathing before?" If Yes, ask: "When was the last time?" and "What happened that time?"      COPD  7. LUNG HISTORY: "Do you have any history of lung disease?"  (e.g., pulmonary embolus, asthma, emphysema)     COPD 8. CAUSE: "What do you think is causing the breathing problem?"      Viral infection 9. OTHER SYMPTOMS:  "Do you have any other symptoms? (e.g., dizziness, runny nose, cough, chest pain, fever)     Sore throat 10. O2 SATURATION MONITOR:  "Do you use an oxygen saturation monitor (pulse oximeter) at home?" If Yes, ask: "What is your reading (oxygen level) today?" "What is your usual oxygen saturation reading?" (e.g., 95%)       Per watch 100%  Protocols used: Breathing Difficulty-A-AH

## 2023-10-09 ENCOUNTER — Telehealth: Payer: Self-pay | Admitting: Nurse Practitioner

## 2023-10-09 NOTE — Telephone Encounter (Signed)
Called and notified patient that results are not back yet. Advised patient we would call her with results once we have them.

## 2023-10-09 NOTE — Telephone Encounter (Signed)
Patient has called to follow up on her Covid Test results that she had done yesterday 10/08/2023. Please advise and contact patient back at phone number # [phone].

## 2023-10-10 ENCOUNTER — Other Ambulatory Visit: Payer: Self-pay | Admitting: Family Medicine

## 2023-10-10 ENCOUNTER — Telehealth: Payer: Self-pay

## 2023-10-10 DIAGNOSIS — R059 Cough, unspecified: Secondary | ICD-10-CM

## 2023-10-10 LAB — NOVEL CORONAVIRUS, NAA: SARS-CoV-2, NAA: NOT DETECTED

## 2023-10-10 NOTE — Telephone Encounter (Signed)
It may take longer on the medicine as she's been on it for 3 days. I would advise her to go get a CXR. I've put the order in.

## 2023-10-10 NOTE — Telephone Encounter (Signed)
Routing to provider who saw the patient.  

## 2023-10-10 NOTE — Telephone Encounter (Signed)
Left message for patient to give our office a call back to discuss Dr.Johnson's recommendations.    OK for [facility] to give note if patient calls back.  

## 2023-10-10 NOTE — Telephone Encounter (Signed)
Pt given lab results per notes of Dr. Laural Benes on 10/10/23. Pt verbalized understanding. Patient stated she is not feeling any better and was advised to let the office know by the end of the week if symptoms did not improve.  Everything looks nice and normal. Have a great day!  Written by Dorcas Carrow, DO on 10/10/2023  8:31 AM EST

## 2023-10-10 NOTE — Telephone Encounter (Signed)
Pt called back, given recommendations from Dr. Laural Benes. Pt given location details for CXR. Pt states she wont be able to go today but will try tomorrow. Advised would call when results come back. No further assistance noted.

## 2023-10-19 ENCOUNTER — Other Ambulatory Visit: Payer: Self-pay | Admitting: Nurse Practitioner

## 2023-10-22 ENCOUNTER — Ambulatory Visit
Admission: RE | Admit: 2023-10-22 | Discharge: 2023-10-22 | Disposition: A | Discharge status: Home / Self Care | Payer: BC Managed Care – PPO | Source: Home / Self Care | Attending: Family Medicine | Admitting: Family Medicine

## 2023-10-22 ENCOUNTER — Telehealth: Payer: Self-pay | Admitting: Family Medicine

## 2023-10-22 ENCOUNTER — Ambulatory Visit
Admission: RE | Admit: 2023-10-22 | Discharge: 2023-10-22 | Disposition: A | Discharge status: Home / Self Care | Payer: Medicare Other | Source: Ambulatory Visit | Attending: Family Medicine | Admitting: Family Medicine

## 2023-10-22 ENCOUNTER — Ambulatory Visit (INDEPENDENT_AMBULATORY_CARE_PROVIDER_SITE_OTHER): Payer: BC Managed Care – PPO | Admitting: Family Medicine

## 2023-10-22 VITALS — BP 131/81 | HR 83 | Temp 97.7°F | Ht 68.31 in | Wt 225.0 lb

## 2023-10-22 DIAGNOSIS — J441 Chronic obstructive pulmonary disease with (acute) exacerbation: Secondary | ICD-10-CM | POA: Diagnosis not present

## 2023-10-22 MED ORDER — BREZTRI AEROSPHERE 160-9-4.8 MCG/ACT IN AERO: 2.0000 | INHALATION_SPRAY | Freq: Two times a day (BID) | RESPIRATORY_TRACT | 11 refills | Status: DC

## 2023-10-22 NOTE — Assessment & Plan Note (Addendum)
CALL PATIENT WITH RESULTS.

## 2023-10-22 NOTE — Progress Notes (Unsigned)
BP 131/81   Pulse 83   Temp 97.7 F (36.5 C) (Oral)   Ht 5' 8.31" (1.735 m)   Wt 225 lb (102.1 kg)   SpO2 96%   BMI 33.90 kg/m    Subjective:    Patient ID: Meredith Butler, female    DOB: 1956/07/31, 67 y.o.   MRN: [id number]  HPI: Meredith Butler is a 67 y.o. female  No chief complaint on file.  She is here today for follow up for COPD exacerbation. She received nebulizer and kenalog injection in office followed by Prednisone and Azithromycin, and albuterol nebulizer. She completed the azithromycin, but was not able to finish the prednisone due to high blood sugars ranging around 400. A chest xray was ordered but patient was not able to get this.   She admits cough is still present along with sob, wheezing, chest pain, runny nose, post nasal drip, She feels she gets better but then gets back worse. Her symptoms are not as bad as they were before but she did see improvement after completing the antibiotics she felt better but thick mucus cough remains.   She has not tried anything for congestion or mucus. She has taken zyrtec, claritin before.    UPPER RESPIRATORY TRACT INFECTION Worst symptom: throat and chest  Fever: no Cough: yes Shortness of breath: yes with walking.  Wheezing: yes Chest pain: no Chest tightness: yes Chest congestion: yes Nasal congestion: yes Runny nose: yes Post nasal drip: yes Sneezing: no Sore throat: No  Swollen glands: no Sinus pressure: yes Headache: Yes Face pain: no Toothache: no Ear pain: no  Ear pressure: yes "right Eyes red/itching:No  Eye drainage/crusting: eye crusting  Vomiting: no Rash: no Fatigue: yes Sick contacts: no Strep contacts: no  Context: worse Recurrent sinusitis: no Relief with OTC cold/cough medications: no  Treatments attempted: None    Relevant past medical, surgical, family and social history reviewed and updated as indicated. Interim medical history since our last visit reviewed. Allergies and medications  reviewed and updated.  Review of Systems  Constitutional: Negative.   HENT: Negative.    Respiratory:  Positive for cough, chest tightness, shortness of breath and wheezing. Negative for apnea, choking and stridor.   Cardiovascular: Negative.   Gastrointestinal: Negative.   Skin: Negative.   Psychiatric/Behavioral: Negative.      Per HPI unless specifically indicated above     Objective:    BP 131/81   Pulse 83   Temp 97.7 F (36.5 C) (Oral)   Ht 5' 8.31" (1.735 m)   Wt 225 lb (102.1 kg)   SpO2 96%   BMI 33.90 kg/m   Wt Readings from Last 3 Encounters:  10/22/23 225 lb (102.1 kg)  10/08/23 224 lb 6.4 oz (101.8 kg)  08/14/23 227 lb (103 kg)    Physical Exam Vitals and nursing note reviewed.  Constitutional:      General: She is not in acute distress.    Appearance: Normal appearance. She is not ill-appearing, toxic-appearing or diaphoretic.  HENT:     Head: Normocephalic and atraumatic.     Right Ear: Tympanic membrane, ear canal and external ear normal. There is no impacted cerumen.     Left Ear: Tympanic membrane, ear canal and external ear normal. There is no impacted cerumen.     Nose: Rhinorrhea present. No congestion.     Mouth/Throat:     Mouth: Mucous membranes are moist.     Pharynx: Oropharynx is clear. No oropharyngeal exudate or  posterior oropharyngeal erythema.  Eyes:     General: No scleral icterus.       Right eye: No discharge.        Left eye: No discharge.     Extraocular Movements: Extraocular movements intact.     Conjunctiva/sclera: Conjunctivae normal.     Pupils: Pupils are equal, round, and reactive to light.  Cardiovascular:     Rate and Rhythm: Normal rate and regular rhythm.     Pulses: Normal pulses.     Heart sounds: Normal heart sounds. No murmur heard.    No friction rub. No gallop.  Pulmonary:     Effort: Pulmonary effort is normal. No respiratory distress.     Breath sounds: No stridor. Wheezing and rhonchi present. No rales.   Chest:     Chest wall: No tenderness.  Musculoskeletal:        General: Normal range of motion.     Cervical back: Normal range of motion and neck supple.  Skin:    General: Skin is warm and dry.     Capillary Refill: Capillary refill takes less than 2 seconds.     Coloration: Skin is not jaundiced or pale.     Findings: No bruising, erythema, lesion or rash.  Neurological:     General: No focal deficit present.     Mental Status: She is alert and oriented to person, place, and time. Mental status is at baseline.  Psychiatric:        Mood and Affect: Mood normal.        Behavior: Behavior normal.        Thought Content: Thought content normal.        Judgment: Judgment normal.     Results for orders placed or performed in visit on 10/08/23  Novel Coronavirus, NAA ([facility])   Specimen: Nasopharyngeal(NP) swabs in vial transport medium  Result Value Ref Range   SARS-CoV-2, NAA Not Detected Not Detected  Veritor Flu A/B Waived  Result Value Ref Range   Influenza A Negative Negative   Influenza B Negative Negative      Assessment & Plan:   Problem List Items Addressed This Visit     COPD exacerbation (HCC) - Primary    CALL PATIENT WITH RESULTS.       Relevant Medications   Budeson-Glycopyrrol-Formoterol (BREZTRI AEROSPHERE) 160-9-4.8 MCG/ACT AERO   Other Relevant Orders   DG Chest 2 View (Completed)      Follow up plan: Return in about 2 weeks (around 11/05/2023) for Follow up.

## 2023-10-22 NOTE — Telephone Encounter (Signed)
Requested medication (s) are due for refill today: yes  Requested medication (s) are on the active medication list: yes  Last refill:  02/15/23 6 ml 1 RF  Future visit scheduled: yes  Notes to clinic:  last Hgb A1C outside normal range   Requested Prescriptions  Pending Prescriptions Disp Refills   OZEMPIC, 0.25 OR 0.5 MG/DOSE, 2 MG/3ML SOPN [Pharmacy Med Name: OZEMPIC 0.25-0.5 MG/DOSE PEN]  1    Sig: INJECT 0.5 MG INTO THE SKIN ONCE A WEEK. START WITH 0.25MG  ONCE A WEEK X 4 WEEKS, THEN INCREASE TO 0.5MG  WEEKLY.     Endocrinology:  Diabetes - GLP-1 Receptor Agonists - semaglutide Failed - 10/19/2023  8:59 AM      Failed - HBA1C in normal range and within 180 days    HB A1C (BAYER DCA - WAIVED)  Date Value Ref Range Status  12/13/2020 6.8 <7.0 % Final    Comment:                                          Diabetic Adult            <7.0                                       Healthy Adult        4.3 - 5.7                                                           (DCCT/NGSP) American Diabetes Association's Summary of Glycemic Recommendations for Adults with Diabetes: Hemoglobin A1c <7.0%. More stringent glycemic goals (A1c <6.0%) may further reduce complications at the cost of increased risk of hypoglycemia.    Hgb A1c MFr Bld  Date Value Ref Range Status  05/07/2023 6.7 (H) 4.8 - 5.6 % Final    Comment:             Prediabetes: 5.7 - 6.4          Diabetes: >6.4          Glycemic control for adults with diabetes: <7.0          Passed - Cr in normal range and within 360 days    Creatinine, Ser  Date Value Ref Range Status  05/07/2023 0.85 0.57 - 1.00 mg/dL Final         Passed - Valid encounter within last 6 months    Recent Outpatient Visits           Today COPD exacerbation [facility])   [place] [facility], Sherran Needs, NP   2 weeks ago COPD exacerbation [facility])   [place] [facility] [place], Megan P, DO   2 months ago Acute  cough   [place] [facility] Jackolyn Confer, MD   5 months ago Hypertension associated with diabetes [facility])   [place] [facility] Larae Grooms, NP   8 months ago Aortic atherosclerosis [facility])   [place] [facility] Larae Grooms, NP       Future Appointments  In 1 week Larae Grooms, NP [place] [facility]

## 2023-10-22 NOTE — Patient Instructions (Signed)
Try Mucinex daily for the next 5 days Stay hydrated drinking plenty of water Try humidified air using distilled water Zyrtec/Claritin daily for allergies.

## 2023-10-27 NOTE — Telephone Encounter (Signed)
Error

## 2023-10-30 ENCOUNTER — Encounter: Payer: Self-pay | Admitting: Nurse Practitioner

## 2023-10-30 ENCOUNTER — Ambulatory Visit (INDEPENDENT_AMBULATORY_CARE_PROVIDER_SITE_OTHER): Payer: BC Managed Care – PPO | Admitting: Nurse Practitioner

## 2023-10-30 DIAGNOSIS — E1159 Type 2 diabetes mellitus with other circulatory complications: Secondary | ICD-10-CM | POA: Diagnosis not present

## 2023-10-30 DIAGNOSIS — I700 Atherosclerosis of aorta: Secondary | ICD-10-CM | POA: Diagnosis not present

## 2023-10-30 DIAGNOSIS — J432 Centrilobular emphysema: Secondary | ICD-10-CM

## 2023-10-30 DIAGNOSIS — E1169 Type 2 diabetes mellitus with other specified complication: Secondary | ICD-10-CM | POA: Diagnosis not present

## 2023-10-30 DIAGNOSIS — E785 Hyperlipidemia, unspecified: Secondary | ICD-10-CM

## 2023-10-30 DIAGNOSIS — I152 Hypertension secondary to endocrine disorders: Secondary | ICD-10-CM

## 2023-10-30 LAB — MICROALBUMIN, URINE WAIVED
Creatinine, Urine Waived: 100 mg/dL (ref 10–300)
Microalb, Ur Waived: 10 mg/L (ref 0–19)
Microalb/Creat Ratio: <30 mg/g (ref <30)

## 2023-10-30 NOTE — Assessment & Plan Note (Signed)
 Recommended eating smaller high protein, low fat meals more frequently and exercising 30 mins a day 5 times a week with a goal of 10-15lb weight loss in the next 3 months.  Continue with Ozempic.

## 2023-10-30 NOTE — Assessment & Plan Note (Signed)
Chronic. Well controlled.  Last 6.7%.  Continue with Metformin and Ozempic 0.5mg  weekly.  Patient is doing very well with Ozempic.  Uses CGM which has also helped control her sugars.  Labs ordered at visit today.  Follow up in 6 months.  Call sooner if concerns arise.

## 2023-10-30 NOTE — Assessment & Plan Note (Addendum)
Chronic.  Controlled.  Continue with current medication regimen of Zetia '10mg'$ . Does not tolerate statins.  Refills sent today.  Labs ordered today. Return to clinic in 6 months for reevaluation.  Call sooner if concerns arise.

## 2023-10-30 NOTE — Assessment & Plan Note (Signed)
Chronic.  Controlled.  Continue with current medication regimen of Amlodipine 5mg , Lisinoprol 25mg  (she is taking a combo Lisinopril/hydrochlorothiazide 20 and 25mg  plus a Lisinopril 5mg  tab), and HCTZ 25mg .  Well controlled at home.   If needed can increase Amlodipine to 10mg .  Continue to check blood pressures at home.  Labs ordered today.  Return to clinic in 6 months for reevaluation.  Call sooner if concerns arise.

## 2023-10-30 NOTE — Assessment & Plan Note (Signed)
Chronic.  Controlled.  Continue with current medication regimen on Zetia 10mg .  Does not tolerate statins.  Return to clinic in 4 months for reevaluation.  Call sooner if concerns arise.

## 2023-10-30 NOTE — Assessment & Plan Note (Signed)
Chronic.  Controlled.  Has a recent exacerbation which she is still recovering from.  Continue with current medication regimen of Breztri.  Labs ordered today.  Return to clinic in 6 months for reevaluation.  Call sooner if concerns arise.

## 2023-10-30 NOTE — Progress Notes (Signed)
BP 116/82 (BP Location: Left Arm, Patient Position: Sitting, Cuff Size: Large)   Pulse 87   Temp 98.3 F (36.8 C) (Oral)   Ht 5\' 8"  (1.727 m)   Wt 224 lb 12.8 oz (102 kg)   SpO2 96%   BMI 34.18 kg/m    Subjective:    Patient ID: Meredith Butler, female    DOB: 1956-06-11, 67 y.o.   MRN: [id number]  HPI: Meredith Butler is a 67 y.o. female  Chief Complaint  Patient presents with   4 month follow up   Hypertension   Hyperlipidemia   Shortness of Breath   HYPERTENSION Hypertension status: controlled  Satisfied with current treatment? yes Duration of hypertension: years BP monitoring frequency:  daily BP range: <115/80 well controlled at home. Patient brought her log. BP medication side effects:  no Medication compliance: excellent compliance Previous BP meds:amlodipine, lisinopril, and lisinopril-HCTZ Aspirin: yes Recurrent headaches: no Visual changes: no Palpitations: no Dyspnea: yes Chest pain: no Lower extremity edema: no Dizzy/lightheaded: sometimes  DIABETES Patient stats she is doing well with the Ozempic.  She is doing the Ozempic 0.5mg .  She is also taking Metformin.   Hypoglycemic episodes: sometimes between breakfast or lunch Polydipsia/polyuria: no Visual disturbance: no Chest pain: no Paresthesias: no Glucose Monitoring: yes  Accucheck frequency: Daily  Fasting glucose: 100  Post prandial:  Evening:  Before meals: Taking Insulin?: no  Long acting insulin:  Short acting insulin: Blood Pressure Monitoring: daily Retinal Examination: Up to Date Foot Exam: Up to Date Diabetic Education: Not Completed Pneumovax: Not up to Date Influenza: Not up to Date Aspirin: no  COPD Had a recent COPD exacerbation with URI.  She does feel like its getting better.  Still using the Breztri. Couldn't complete the prednisone due to sugars going up to over 400. COPD status: controlled Satisfied with current treatment?: yes Oxygen use: no Dyspnea frequency: once daily or  1x every two days Cough frequency: sometimes Rescue inhaler frequency:  once every other day Limitation of activity: yes Productive cough: Sometimes  Last Spirometry:  Pneumovax: Not up to Date Influenza: Not up to Date    Relevant past medical, surgical, family and social history reviewed and updated as indicated. Interim medical history since our last visit reviewed. Allergies and medications reviewed and updated.  Review of Systems  Eyes:  Negative for visual disturbance.  Respiratory:  Negative for cough, chest tightness and shortness of breath.   Cardiovascular:  Negative for chest pain, palpitations and leg swelling.  Endocrine: Negative for polydipsia and polyuria.  Neurological:  Positive for dizziness. Negative for numbness and headaches.    Per HPI unless specifically indicated above     Objective:    BP 116/82 (BP Location: Left Arm, Patient Position: Sitting, Cuff Size: Large)   Pulse 87   Temp 98.3 F (36.8 C) (Oral)   Ht 5\' 8"  (1.727 m)   Wt 224 lb 12.8 oz (102 kg)   SpO2 96%   BMI 34.18 kg/m   Wt Readings from Last 3 Encounters:  10/30/23 224 lb 12.8 oz (102 kg)  10/22/23 225 lb (102.1 kg)  10/08/23 224 lb 6.4 oz (101.8 kg)    Physical Exam Vitals and nursing note reviewed.  Constitutional:      General: She is not in acute distress.    Appearance: Normal appearance. She is normal weight. She is not ill-appearing, toxic-appearing or diaphoretic.  HENT:     Head: Normocephalic.     Right Ear: External  ear normal.     Left Ear: External ear normal.     Nose: Nose normal.     Mouth/Throat:     Mouth: Mucous membranes are moist.     Pharynx: Oropharynx is clear.  Eyes:     General:        Right eye: No discharge.        Left eye: No discharge.     Extraocular Movements: Extraocular movements intact.     Conjunctiva/sclera: Conjunctivae normal.     Pupils: Pupils are equal, round, and reactive to light.  Cardiovascular:     Rate and Rhythm:  Normal rate and regular rhythm.     Heart sounds: No murmur heard. Pulmonary:     Effort: Pulmonary effort is normal. No respiratory distress.     Breath sounds: Normal breath sounds. No wheezing or rales.  Musculoskeletal:     Cervical back: Normal range of motion and neck supple.  Skin:    General: Skin is warm and dry.     Capillary Refill: Capillary refill takes less than 2 seconds.  Neurological:     General: No focal deficit present.     Mental Status: She is alert and oriented to person, place, and time. Mental status is at baseline.  Psychiatric:        Mood and Affect: Mood normal.        Behavior: Behavior normal.        Thought Content: Thought content normal.        Judgment: Judgment normal.     Results for orders placed or performed in visit on 10/08/23  Novel Coronavirus, NAA ([facility])   Specimen: Nasopharyngeal(NP) swabs in vial transport medium  Result Value Ref Range   SARS-CoV-2, NAA Not Detected Not Detected  Veritor Flu A/B Waived  Result Value Ref Range   Influenza A Negative Negative   Influenza B Negative Negative      Assessment & Plan:   Problem List Items Addressed This Visit       Cardiovascular and Mediastinum   Aortic atherosclerosis (HCC)    Chronic.  Controlled.  Continue with current medication regimen of Zetia 10mg . Does not tolerate statins.  Refills sent today.  Labs ordered today. Return to clinic in 6 months for reevaluation.  Call sooner if concerns arise.        Hypertension associated with diabetes (HCC)    Chronic.  Controlled.  Continue with current medication regimen of Amlodipine 5mg , Lisinoprol 25mg  (she is taking a combo Lisinopril/hydrochlorothiazide 20 and 25mg  plus a Lisinopril 5mg  tab), and HCTZ 25mg .  Well controlled at home.   If needed can increase Amlodipine to 10mg .  Continue to check blood pressures at home.  Labs ordered today.  Return to clinic in 6 months for reevaluation.  Call sooner if concerns arise.          Respiratory   Centrilobular emphysema (HCC)    Chronic.  Controlled.  Has a recent exacerbation which she is still recovering from.  Continue with current medication regimen of Breztri.  Labs ordered today.  Return to clinic in 6 months for reevaluation.  Call sooner if concerns arise.         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 Well controlled.  Last 6.7%.  Continue with Metformin and Ozempic 0.5mg  weekly.  Patient is doing very well with Ozempic.  Uses CGM which has also helped control her  sugars.  Labs ordered at visit today.  Follow up in 6 months.  Call sooner if concerns arise.       Relevant Orders   Comp Met (CMET)   HgB A1c   Microalbumin, Urine Waived   Hyperlipidemia associated with type 2 diabetes mellitus (HCC)    Chronic.  Controlled.  Continue with current medication regimen on Zetia 10mg .  Does not tolerate statins.  Return to clinic in 4 months for reevaluation.  Call sooner if concerns arise.       Relevant Orders   Lipid Profile     Other   Morbid obesity (HCC)    Recommended eating smaller high protein, low fat meals more frequently and exercising 30 mins a day 5 times a week with a goal of 10-15lb weight loss in the next 3 months.  Continue with Ozempic.         Follow up plan: Return in about 6 months (around 04/29/2024) for HTN, HLD, DM2 FU.

## 2023-10-31 LAB — COMPREHENSIVE METABOLIC PANEL
ALT: 38 [IU]/L — ABNORMAL HIGH (ref 0–32)
AST: 31 [IU]/L (ref 0–40)
Albumin: 4.4 g/dL (ref 3.9–4.9)
Alkaline Phosphatase: 62 [IU]/L (ref 44–121)
BUN/Creatinine Ratio: 14 (ref 12–28)
BUN: 14 mg/dL (ref 8–27)
Bilirubin Total: 0.7 mg/dL (ref 0.0–1.2)
CO2: 23 mmol/L (ref 20–29)
Calcium: 9.9 mg/dL (ref 8.7–10.3)
Chloride: 98 mmol/L (ref 96–106)
Creatinine, Ser: 0.97 mg/dL (ref 0.57–1.00)
Globulin, Total: 3.1 g/dL (ref 1.5–4.5)
Glucose: 80 mg/dL (ref 70–99)
Potassium: 4.4 mmol/L (ref 3.5–5.2)
Sodium: 139 mmol/L (ref 134–144)
Total Protein: 7.5 g/dL (ref 6.0–8.5)
eGFR: 64 mL/min/{1.73_m2} (ref >59)

## 2023-10-31 LAB — LIPID PANEL
Chol/HDL Ratio: 3.5 {ratio} (ref 0.0–4.4)
Cholesterol, Total: 178 mg/dL (ref 100–199)
HDL: 51 mg/dL (ref >39)
LDL Chol Calc (NIH): 87 mg/dL (ref 0–99)
Triglycerides: 244 mg/dL — ABNORMAL HIGH (ref 0–149)
VLDL Cholesterol Cal: 40 mg/dL (ref 5–40)

## 2023-10-31 LAB — HEMOGLOBIN A1C
Est. average glucose Bld gHb Est-mCnc: 160 mg/dL
Hgb A1c MFr Bld: 7.2 % — ABNORMAL HIGH (ref 4.8–5.6)

## 2023-11-02 ENCOUNTER — Other Ambulatory Visit: Payer: Self-pay | Admitting: Nurse Practitioner

## 2023-11-04 ENCOUNTER — Other Ambulatory Visit: Payer: Self-pay | Admitting: Nurse Practitioner

## 2023-11-04 MED ORDER — LISINOPRIL 5 MG PO TABS: 5.0000 mg | ORAL_TABLET | Freq: Every day | ORAL | 1 refills | Status: DC

## 2023-11-04 MED ORDER — AMLODIPINE BESYLATE 5 MG PO TABS: 5.0000 mg | ORAL_TABLET | Freq: Every day | ORAL | 1 refills | Status: DC

## 2023-11-04 NOTE — Telephone Encounter (Signed)
Requested medication (s) are due for refill today: Yes  Requested medication (s) are on the active medication list: Yes  Last refill:  10/08/23  Future visit scheduled: Yes  Notes to clinic:  Unable to refill per protocol, last refill by another provider. Noted to take along with lisinopril-hydrochlorothiazide to equal 25 mg lisinopril     Requested Prescriptions  Pending Prescriptions Disp Refills   lisinopril (ZESTRIL) 5 MG tablet      Sig: Take 1 tablet (5 mg total) by mouth daily.     Cardiovascular:  ACE Inhibitors Passed - 11/04/2023  3:30 PM      Passed - Cr in normal range and within 180 days    Creatinine, Ser  Date Value Ref Range Status  10/30/2023 0.97 0.57 - 1.00 mg/dL Final         Passed - K in normal range and within 180 days    Potassium  Date Value Ref Range Status  10/30/2023 4.4 3.5 - 5.2 mmol/L Final         Passed - Patient is not pregnant      Passed - Last BP in normal range    BP Readings from Last 1 Encounters:  10/30/23 116/82         Passed - Valid encounter within last 6 months    Recent Outpatient Visits           5 days ago Type 2 diabetes mellitus with morbid obesity (HCC)    [place] [facility], NP   1 week ago COPD exacerbation [facility])   [place] [facility], Sherran Needs, NP   3 weeks ago COPD exacerbation [facility])   [place] [facility] [place], Megan P, DO   2 months ago Acute cough   [place] [facility] Jackolyn Confer, MD   6 months ago Hypertension associated with diabetes [facility])   [place] [facility] Lar[facility] Grooms, NP       Future Appointments             In 5 months Larae Grooms, NP [place] [facility], [facility]            Signed Prescriptions Disp Refills   amLODipine (NORVASC) 5 MG tablet 90 tablet 1    Sig: Take 1 tablet (5 mg total) by mouth daily.     Cardiovascular: Calcium  Channel Blockers 2 Passed - 11/04/2023  3:30 PM      Passed - Last BP in normal range    BP Readings from Last 1 Encounters:  10/30/23 116/82         Passed - Last Heart Rate in normal range    Pulse Readings from Last 1 Encounters:  10/30/23 87         Passed - Valid encounter within last 6 months    Recent Outpatient Visits           5 days ago Type 2 diabetes mellitus with morbid obesity (HCC)   [place] [facility] Larae Grooms, NP   1 week ago COPD exacerbation [facility])   [place] [facility], Sherran Needs, NP   3 weeks ago COPD exacerbation [facility])   [place] [facility] [place], Megan P, DO   2 months ago Acute cough    [place] [facility] Jackolyn Confer, MD   6 months ago Hypertension associated with diabetes (HCC)   [facility]ae Grooms, NP       Future Appointments             In 5 months Larae Grooms, NP [facility], [facility]

## 2023-11-04 NOTE — Telephone Encounter (Signed)
Lisinopril-hydrochlorothiazide sent in on 11/04/23 in a separate refill encounter. Noted in OV notes to take lisinopril 5 mg in addition to lisinopril-hydrochlorothiazide 20-25 mg.

## 2023-11-04 NOTE — Telephone Encounter (Signed)
Lisinopril changed to zestoretic on 05/07/23, will refuse due to this.   Requested Prescriptions  Pending Prescriptions Disp Refills   lisinopril-hydrochlorothiazide (ZESTORETIC) 20-25 MG tablet [Pharmacy Med Name: LISINOPRIL-HCTZ 20-25 MG TAB] 90 tablet 1    Sig: TAKE 1 TABLET BY MOUTH EVERY DAY     Cardiovascular:  ACEI + Diuretic Combos Passed - 11/04/2023  1:24 PM      Passed - Na in normal range and within 180 days    Sodium  Date Value Ref Range Status  10/30/2023 139 134 - 144 mmol/L Final         Passed - K in normal range and within 180 days    Potassium  Date Value Ref Range Status  10/30/2023 4.4 3.5 - 5.2 mmol/L Final         Passed - Cr in normal range and within 180 days    Creatinine, Ser  Date Value Ref Range Status  10/30/2023 0.97 0.57 - 1.00 mg/dL Final         Passed - eGFR is 30 or above and within 180 days    GFR calc Af Amer  Date Value Ref Range Status  12/13/2020 80 >59 mL/min/1.73 Final    Comment:    **In accordance with recommendations from the NKF-ASN Task force,**   [facility] is in the process of updating its eGFR calculation to the   2021 CKD-EPI creatinine equation that estimates kidney function   without a race variable.    GFR, Estimated  Date Value Ref Range Status  07/02/2022 >60 >60 mL/min Final    Comment:    (NOTE) Calculated using the CKD-EPI Creatinine Equation (2021)    eGFR  Date Value Ref Range Status  10/30/2023 64 >59 mL/min/1.73 Final         Passed - Patient is not pregnant      Passed - Last BP in normal range    BP Readings from Last 1 Encounters:  10/30/23 116/82         Passed - Valid encounter within last 6 months    Recent Outpatient Visits           5 days ago Type 2 diabetes mellitus with morbid obesity (HCC)   [place] [facility], NP   1 week ago COPD exacerbation [facility])   [place] [facility], Sherran Needs, NP   3 weeks ago COPD  exacerbation [facility])   [place] [facility] [place], Megan P, DO   2 months ago Acute cough   [place] [facility] Jackolyn Confer, MD   6 months ago Hypertension associated with diabetes [facility])   [place] [facility] Larae Grooms, NP       Future Appointments             In 5 months Larae Grooms, NP [place] [facility], [facility]            Refused Prescriptions Disp Refills   lisinopril (ZESTRIL) 20 MG tablet [Pharmacy Med Name: LISINOPRIL 20 MG TABLET] 90 tablet 1    Sig: TAKE 1 TABLET BY MOUTH EVERY DAY     Cardiovascular:  ACE Inhibitors Passed - 11/04/2023  1:24 PM      Passed - Cr in normal range and within 180 days    Creatinine, Ser  Date Value Ref Range Status  10/30/2023 0.97 0.57 - 1.00 mg/dL Final  Passed - K in normal range and within 180 days    Potassium  Date Value Ref Range Status  10/30/2023 4.4 3.5 - 5.2 mmol/L Final         Passed - Patient is not pregnant      Passed - Last BP in normal range    BP Readings from Last 1 Encounters:  10/30/23 116/82         Passed - Valid encounter within last 6 months    Recent Outpatient Visits           5 days ago Type 2 diabetes mellitus with morbid obesity (HCC)   [place] [facility], NP   1 week ago COPD exacerbation [facility])   [place] [facility], Sherran Needs, NP   3 weeks ago COPD exacerbation [facility])   [place] [facility] [place], Megan P, DO   2 months ago Acute cough   [place] [facility] Jackolyn Confer, MD   6 months ago Hypertension associated with diabetes [facility])   [place] [facility] Larae Grooms, NP       Future Appointments             In 5 months Larae Grooms, NP [place] [facility], [facility]

## 2023-11-04 NOTE — Telephone Encounter (Signed)
Medication Refill -  Most Recent Primary Care Visit:  Provider: Larae Grooms  Department: [facility]  Visit Type: OFFICE VISIT  Date: 10/30/19 Medication: lisinopril (ZESRIL) 5 MG tablet           lisinopril-hydrochlorothiazide (ZESTORETIC) 20-25 MG tablet  amLODipine (NORVASC) 5 MG tablet   Has the patient contacted their pharmacy? Yes (Agent: If no, request that the patient contact the pharmacy for the refill. If patient does not wish to contact the pharmacy document the reason why and proceed with request.) (Agent: If yes, when and what did the pharmacy advise?)  Is this the correct pharmacy for this prescription? Yes  [id number] - [place], [place] - [address] [place] [address] Phone: [phone] Fax: [phone]   Has the prescription been filled recently? Yes  Is the patient out of the medication? No  Has the patient been seen for an appointment in the last year OR does the patient have an upcoming appointment? Yes  Can we respond through MyChart? No  Agent: Please be advised that Rx refills may take up to 3 business days. We ask that you follow-up with your pharmacy.   Pt states that she got a call from pharmacy to follow up with dr office.

## 2023-11-15 ENCOUNTER — Ambulatory Visit: Payer: Self-pay

## 2023-11-15 NOTE — Telephone Encounter (Signed)
Patient called, left VM to return the call to the office to speak to the NT.   Explanation of medication is listed in the OV note on 10/30/23.  Summary: lisinopril (ZESTRIL) 5 MG tablet?   The patient called in wanting to speak with he provider about the medication prescribed, lisinopril (ZESTRIL) 5 MG tablet. She states she doesn't understand why its 5mg  and not the 20mg  she has been taking for a long time. Please assist patient further.

## 2023-11-15 NOTE — Telephone Encounter (Signed)
Patient called, left VM to return the call to the office to speak to the NT.   Summary: lisinopril (ZESTRIL) 5 MG tablet?     The patient called in wanting to speak with he provider about the medication prescribed, lisinopril (ZESTRIL) 5 MG tablet. She states she doesn't understand why its 5mg  and not the 20mg  she has been taking for a long time. Please assist patient further.     See below for explanation:  Hypertension associated with diabetes (HCC)       Chronic.  Controlled.  Continue with current medication regimen of Amlodipine 5mg , Lisinoprol 25mg  (she is taking a combo Lisinopril/hydrochlorothiazide 20 and 25mg  plus a Lisinopril 5mg  tab), and HCTZ 25mg .  Well controlled at home.   If needed can increase Amlodipine to 10mg .  Continue to check blood pressures at home.  Labs ordered today.  Return to clinic in 6 months for reevaluation.  Call sooner if concerns arise.

## 2023-11-18 MED ORDER — LISINOPRIL-HYDROCHLOROTHIAZIDE 20-25 MG PO TABS: 1.0000 | ORAL_TABLET | Freq: Every day | ORAL | 1 refills | Status: DC

## 2023-11-18 MED ORDER — LISINOPRIL 20 MG PO TABS: 20.0000 mg | ORAL_TABLET | Freq: Every day | ORAL | 1 refills | Status: DC

## 2023-11-18 NOTE — Telephone Encounter (Signed)
Patient needs RX for the 20 mg Lisinopril if she is to still be taking this.

## 2023-11-18 NOTE — Telephone Encounter (Signed)
Patient notified that medication was sent in.

## 2023-11-18 NOTE — Telephone Encounter (Signed)
Lisinopril 20mg  sent to the pharmacy also.

## 2023-11-18 NOTE — Addendum Note (Signed)
Addended by: Larae Grooms on: 11/18/2023 11:24 AM   Modules accepted: Orders

## 2023-11-18 NOTE — Addendum Note (Signed)
Addended by: Larae Grooms on: 11/18/2023 10:03 AM   Modules accepted: Orders

## 2023-11-18 NOTE — Telephone Encounter (Signed)
Her dose is Lisinopril 20 with 25 of hydrochlorothiazide.  [place] prescription sent in.

## 2023-11-18 NOTE — Telephone Encounter (Signed)
Called and spoke with patient. She states that she has always taken the Lisinopril-hydrochlorothiazide 20-25 plus an additional Lisinopril 20 mg. Patient states that the last prescription sent in for the plain lisinopril was for 5 mg but she has always taken the additional 20 mg. Reviewed historical meds and Lisinopril 20 mg is listed. Patient is asking if this can be sent in as she has always been on this regimen. Also wants to know if she needs to come in for a visit to discuss this.

## 2023-12-24 ENCOUNTER — Other Ambulatory Visit: Payer: Self-pay | Admitting: Interventional Radiology

## 2023-12-24 DIAGNOSIS — C642 Malignant neoplasm of left kidney, except renal pelvis: Secondary | ICD-10-CM

## 2023-12-29 ENCOUNTER — Other Ambulatory Visit: Payer: Self-pay | Admitting: Physician Assistant

## 2023-12-29 DIAGNOSIS — E1169 Type 2 diabetes mellitus with other specified complication: Secondary | ICD-10-CM

## 2023-12-31 NOTE — Telephone Encounter (Signed)
Requested Prescriptions  Pending Prescriptions Disp Refills   Continuous Glucose Sensor (DEXCOM G7 SENSOR) MISC [Pharmacy Med Name: DEXCOM G7 SENSOR] 9 each 1    Sig: REPLACE SENSOR EVERY 10 DAYS, REPLACE TRANSMITTER EVERY 90 DAYS     Endocrinology: Diabetes - Testing Supplies Passed - 12/31/2023  8:07 AM      Passed - Valid encounter within last 12 months    Recent Outpatient Visits           2 months ago Type 2 diabetes mellitus with morbid obesity (HCC)   [place] [facility] Lar[facility] Grooms, NP   2 months ago COPD exacerbation [facility])   [place] [facility], Sherran Needs, NP   2 months ago COPD exacerbation [facility])   [place] [facility], Megan P, DO   4 months ago Acute cough   [place] [facility] Jackolyn Confer, MD   7 months ago Hypertension associated with diabetes [facility])    [facility] Larae Grooms, NP       Future Appointments             In 4 months Larae Grooms, NP  [facility], [facility]

## 2024-01-13 ENCOUNTER — Other Ambulatory Visit: Payer: Self-pay | Admitting: Nurse Practitioner

## 2024-01-14 ENCOUNTER — Ambulatory Visit
Admission: RE | Admit: 2024-01-14 | Discharge: 2024-01-14 | Disposition: A | Discharge status: Home / Self Care | Payer: BC Managed Care – PPO | Source: Ambulatory Visit | Attending: Interventional Radiology | Admitting: Interventional Radiology

## 2024-01-14 DIAGNOSIS — C642 Malignant neoplasm of left kidney, except renal pelvis: Secondary | ICD-10-CM | POA: Insufficient documentation

## 2024-01-14 LAB — POCT I-STAT CREATININE: Creatinine, Ser: 0.9 mg/dL (ref 0.44–1.00)

## 2024-01-14 MED ORDER — IOHEXOL 300 MG/ML  SOLN
100.0000 mL | Freq: Once | INTRAMUSCULAR | Status: AC | PRN
  Administered 2024-01-14: 100 mL via INTRAVENOUS

## 2024-01-14 NOTE — Telephone Encounter (Signed)
 Requested Prescriptions  Pending Prescriptions Disp Refills   ezetimibe (ZETIA) 10 MG tablet [Pharmacy Med Name: EZETIMIBE 10 MG TABLET] 90 tablet 0    Sig: TAKE 1 TABLET BY MOUTH EVERY DAY     Cardiovascular:  Antilipid - Sterol Transport Inhibitors Failed - 01/14/2024 10:47 AM      Failed - ALT in normal range and within 360 days    ALT  Date Value Ref Range Status  10/30/2023 38 (H) 0 - 32 IU/L Final         Failed - Lipid Panel in normal range within the last 12 months    Cholesterol, Total  Date Value Ref Range Status  10/30/2023 178 100 - 199 mg/dL Final   LDL Chol Calc (NIH)  Date Value Ref Range Status  10/30/2023 87 0 - 99 mg/dL Final   HDL  Date Value Ref Range Status  10/30/2023 51 >39 mg/dL Final   Triglycerides  Date Value Ref Range Status  10/30/2023 244 (H) 0 - 149 mg/dL Final         Passed - AST in normal range and within 360 days    AST  Date Value Ref Range Status  10/30/2023 31 0 - 40 IU/L Final         Passed - Patient is not pregnant      Passed - Valid encounter within last 12 months    Recent Outpatient Visits           2 months ago Type 2 diabetes mellitus with morbid obesity (HCC)   [place] [facility] Larae Grooms, NP   2 months ago COPD exacerbation [facility])   [place] [facility], Sherran Needs, NP   3 months ago COPD exacerbation [facility])   [place] [facility], Megan P, DO   5 months ago Acute cough   [place] [facility] Jackolyn Confer, MD   8 months ago Hypertension associated with diabetes [facility])   [place] [facility] Larae Grooms, NP       Future Appointments             In 3 months Larae Grooms, NP [place] [facility], [facility]

## 2024-01-29 ENCOUNTER — Ambulatory Visit
Admission: RE | Admit: 2024-01-29 | Discharge: 2024-01-29 | Disposition: A | Discharge status: Home / Self Care | Payer: BC Managed Care – PPO | Source: Ambulatory Visit | Attending: Interventional Radiology | Admitting: Interventional Radiology

## 2024-01-29 DIAGNOSIS — C642 Malignant neoplasm of left kidney, except renal pelvis: Secondary | ICD-10-CM

## 2024-01-29 HISTORY — PX: IR RADIOLOGIST EVAL & MGMT: IMG5224

## 2024-01-29 NOTE — Progress Notes (Signed)
 Chief Complaint: SP left RCC cryoablation   Referring Physician(s): Dr. Legrand Rams   History of Present Illness: Meredith Butler is a 68 y.o. female presenting as scheduled  follow up to VIR clinic today.  She is SP CT guided tissue ablation of left renal tumor, with biopsy performed at the same time.    Today Meredith Butler joins [place] by way of telemedicine.  We confirmed her identity with 2 personal identifiers.    Prior History:  We performed CT guided tissue ablation of the left kidney tumor with bx on 07/11/22, cryoablation technology.   Final Path 07/11/22:     She went home on the same day, and recovered fine.  She denies any Ribera problems such as flank pain, hematuria, or other.      Follow up imaging was 10/23/22, 05/09/23.  No concerning features.     Interval History:    Today Meredith Butler tells me that she is feeling fine.  She denies any Marmol symptoms.  She denies any flank pain and denies hematuria.     Meredith Butler CT scan was performed 12/25/23.  Not officially interpreted yet by my diagnostic colleagues, however, I have independently reviewed and this shows the usual post-ablation changes with no concerning features.    05/09/23   01/14/24   Past Medical History:  Diagnosis Date   Anxiety    Arthritis    Asthma    COPD (chronic obstructive pulmonary disease) (HCC)    Diabetes mellitus without complication (HCC)    Elevated TSH 12/24/2017   Fibromyalgia    GERD (gastroesophageal reflux disease)    Hypertension    Jaw pain 09/12/2021   Myalgia due to statin 06/18/2020   Peritonitis (HCC)    Pneumonia     Past Surgical History:  Procedure Laterality Date   ABDOMINAL SURGERY     APPENDECTOMY     BREAST BIOPSY Left 04/10/2023   Stereo Bx, Ribbon Clip, Path Pending   BREAST BIOPSY Left 04/10/2023   MM LT BREAST BX W LOC DEV 1ST LESION IMAGE BX SPEC STEREO GUIDE 04/10/2023 [facility]   CHOLECYSTECTOMY     COLON SURGERY     IR RADIOLOGIST EVAL & MGMT  05/31/2022   IR  RADIOLOGIST EVAL & MGMT  08/07/2022   IR RADIOLOGIST EVAL & MGMT  11/09/2022   JOINT REPLACEMENT Bilateral    knee   LEFT HEART CATH AND CORONARY ANGIOGRAPHY N/A 01/05/2020   Procedure: LEFT HEART CATH AND CORONARY ANGIOGRAPHY;  Surgeon: Swaz[place], Peter M, MD;  [facility]: [facility];  Service: Cardiovascular;  Laterality: N/A;   RADIOLOGY WITH ANESTHESIA N/A 07/11/2022   Procedure: Renal Cryo Ablation;  Surgeon: Gilmer Mor, DO;  Location: [facility];  Service: Radiology;  Laterality: N/A;   SMALL INTESTINE SURGERY     TONSILLECTOMY     TOTAL ABDOMINAL HYSTERECTOMY W/ BILATERAL SALPINGOOPHORECTOMY     complete    Allergies: Claforan [cefotaxime], Ibuprofen, Omnicef [cefdinir], Other, Penicillins, Sulfa antibiotics, Tetanus toxoids, Voltaren [diclofenac sodium], Codeine, Diovan [valsartan], Prednisone, Rocephin [ceftriaxone], Statins, and Streptococcus (diplococcus) pneumoniae [streptococci]  Medications: Prior to Admission medications   Medication Sig Start Date End Date Taking? Authorizing Provider  albuterol (VENTOLIN HFA) 108 (90 Base) MCG/ACT inhaler 2 puffs every 6 hours as needed. 11/01/22   Larae Grooms, NP  amLODipine (NORVASC) 5 MG tablet Take 1 tablet (5 mg total) by mouth daily. 11/04/23   Larae Grooms, NP  aspirin EC 81 MG tablet Take 81 mg by mouth  every evening.    [provider]  Budeson-Glycopyrrol-Formoterol (BREZTRI AEROSPHERE) 160-9-4.8 MCG/ACT AERO Inhale 2 puffs into the lungs 2 (two) times daily. 10/22/23   [place], Sherran Needs, NP  Continuous Glucose Sensor (DEXCOM G7 SENSOR) MISC REPLACE SENSOR EVERY 10 DAYS, REPLACE TRANSMITTER EVERY 90 DAYS 12/31/23   Larae Grooms, NP  EPINEPHrine 0.3 mg/0.3 mL IJ SOAJ injection Inject 0.3 mg into the muscle as needed for anaphylaxis. 08/14/23   Jackolyn Confer, MD  ezetimibe (ZETIA) 10 MG tablet TAKE 1 TABLET BY MOUTH EVERY DAY 01/14/24   Larae Grooms, NP  Insulin Pen Needle (NOVOFINE PLUS PEN  NEEDLE) 32G X 4 MM MISC 1 each by Does not apply route once a week. 02/15/23   Larae Grooms, NP  lisinopril (ZESTRIL) 20 MG tablet Take 1 tablet (20 mg total) by mouth daily. 11/18/23   Larae Grooms, NP  lisinopril-hydrochlorothiazide (ZESTORETIC) 20-25 MG tablet Take 1 tablet by mouth daily. 11/18/23   Larae Grooms, NP  Magnesium 250 MG TABS Take 250 mg by mouth daily as needed (leg cramps.).     [provider]  metFORMIN (GLUCOPHAGE) 1000 MG tablet TAKE 1 TABLET (1,000 MG TOTAL) BY MOUTH TWICE A DAY WITH FOOD 05/07/23   Larae Grooms, NP  [facility] VERIO test strip USE TO CHECK BLOOD SUGAR ONCE DAILY 05/02/22   Larae Grooms, NP  pantoprazole (PROTONIX) 40 MG tablet Take 1 tablet (40 mg total) by mouth daily. 08/14/23   Jackolyn Confer, MD  Semaglutide,0.25 or 0.5MG /DOS, (OZEMPIC, 0.25 OR 0.5 MG/DOSE,) 2 MG/3ML SOPN INJECT 0.5 MG INTO THE SKIN ONCE A WEEK. START WITH 0.25MG  ONCE A WEEK X 4 WEEKS, THEN INCREASE TO 0.5MG  WEEKLY. 10/22/23   Larae Grooms, NP     Family History  Problem Relation Age of Onset   Heart attack Mother    Cancer Mother    Diabetes Mother    Heart attack Father    Diabetes Sister    Diabetes Daughter    Cancer Maternal Grandmother        ovarian   Cancer Paternal Grandfather        colon   Cancer Sister        lung   Diabetes Daughter    Diabetes Maternal Aunt    Diabetes Maternal Uncle     Social History   Socioeconomic History   Marital status: Married    Spouse name: Not on file   Number of children: Not on file   Years of education: Not on file   Highest education level: Not on file  Occupational History   Not on file  Tobacco Use   Smoking status: Former    Current packs/day: 0.00    Average packs/day: 2.0 packs/day for 30.0 years (60.0 ttl pk-yrs)    Types: Cigarettes    Start date: 11/21/1979    Quit date: 11/20/2009    Years since quitting: 14.2    Passive exposure: Past   Smokeless tobacco: Never  Vaping Use    Vaping status: Never Used  Substance and Sexual Activity   Alcohol use: No   Drug use: No   Sexual activity: Yes  Other Topics Concern   Not on file  Social History Narrative   Not on file   Social Drivers of Health   Financial Resource Strain: Low Risk  (05/28/2023)   Overall Financial Resource Strain (CARDIA)    Difficulty of Paying Living Expenses: Not very hard  Food Insecurity: No Food Insecurity (05/28/2023)  Hunger Vital Sign    Worried About Running Out of Food in the Last Year: Never true    Ran Out of Food in the Last Year: Never true  Transportation Needs: No Transportation Needs (05/28/2023)   PRAPARE - Administrator, Civil Service (Medical): No    Lack of Transportation (Non-Medical): No  Physical Activity: Insufficiently Active (05/28/2023)   Exercise Vital Sign    Days of Exercise per Week: 1 day    Minutes of Exercise per Session: 60 min  Stress: No Stress Concern Present (05/28/2023)   [employer] of Occupational Health - Occupational Stress Questionnaire    Feeling of Stress : Only a little  Social Connections: Moderately Integrated (05/28/2023)   Social Connection and Isolation Panel [NHANES]    Frequency of Communication with Friends and Family: More than three times a week    Frequency of Social Gatherings with Friends and Family: Once a week    Attends Religious Services: More than 4 times per year    Active Member of [employer] or Organizations: No    Attends Banker Meetings: Never    Marital Status: Married       Review of Systems  Review of Systems: A 12 point ROS discussed and pertinent positives are indicated in the HPI above.  All other systems are negative.      Physical Exam No direct physical exam was performed (except for noted visual exam findings with Video Visits).    Vital Signs: There were no vitals taken for this visit.  Imaging: No results found.  Labs:  CBC: No results for input(s): "WBC", "HGB",  "HCT", "PLT" in the last 8760 hours.  COAGS: No results for input(s): "INR", "APTT" in the last 8760 hours.  BMP: Recent Labs    02/15/23 1040 05/07/23 1108 10/30/23 1339 01/14/24 1033  NA 141 141 139  --   K 4.2 4.4 4.4  --   CL 102 102 98  --   CO2 22 25 23   --   GLUCOSE 107* 97 80  --   BUN 15 16 14   --   CALCIUM 9.8 9.8 9.9  --   CREATININE 0.87 0.85 0.97 0.90    LIVER FUNCTION TESTS: Recent Labs    02/15/23 1040 05/07/23 1108 10/30/23 1339  BILITOT 0.7 0.7 0.7  AST 29 28 31   ALT 32 36* 38*  ALKPHOS 64 60 62  PROT 7.0 7.3 7.5  ALBUMIN 4.5 4.5 4.4    TUMOR MARKERS: No results for input(s): "AFPTM", "CEA", "CA199", "CHROMGRNA" in the last 8760 hours.  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    She is doing fine with no Dominique or recurrent symptoms.     CT shows no concerning features.  We will continue our surveillance, which we discussed. I think we can stretch this out now to 12 months.    Our plan will be repeat CT scan in about 12 months.   Plan: - Follow up contrast enhanced abdominal CT (abd only needed) for surveillance in 1 year from now, with office visit.     Electronically Signed: Gilmer Mor 01/29/2024, 9:09 AM   I spent a total of    25 Minutes in remote  clinical consultation, greater than 50% of which was counseling/coordinating care for [facility] RCC cryoablation, left kidney.    Visit type: Audio and video (epic video).  Alternative for in-person consultation at [facility], [address] [place], [place], [place]. This visit type was conducted due to national recommendations for restrictions regarding the COVID-19 Pandemic (e.g. social distancing).  This format is felt to be most appropriate for this patient at this time.  All issues noted in this document were discussed and addressed.

## 2024-02-01 ENCOUNTER — Other Ambulatory Visit: Payer: Self-pay | Admitting: Pediatrics

## 2024-02-01 ENCOUNTER — Other Ambulatory Visit: Payer: Self-pay | Admitting: Nurse Practitioner

## 2024-02-01 DIAGNOSIS — K219 Gastro-esophageal reflux disease without esophagitis: Secondary | ICD-10-CM

## 2024-02-04 ENCOUNTER — Other Ambulatory Visit: Payer: Self-pay | Admitting: Nurse Practitioner

## 2024-02-04 DIAGNOSIS — E1169 Type 2 diabetes mellitus with other specified complication: Secondary | ICD-10-CM

## 2024-02-04 NOTE — Telephone Encounter (Signed)
 Requested Prescriptions  Refused Prescriptions Disp Refills   pantoprazole (PROTONIX) 40 MG tablet [Pharmacy Med Name: PANTOPRAZOLE SOD DR 40 MG TAB] 90 tablet 1    Sig: TAKE 1 TABLET BY MOUTH EVERY DAY     Gastroenterology: Proton Pump Inhibitors Passed - 02/04/2024  7:52 AM      Passed - Valid encounter within last 12 months    Recent Outpatient Visits           3 months ago Type 2 diabetes mellitus with morbid obesity (HCC)   [place] [facility] Larae Grooms, NP   3 months ago COPD exacerbation [facility])   [place] [facility], Sherran Needs, NP   3 months ago COPD exacerbation [facility])   [place] [facility] [place], Megan P, DO   5 months ago Acute cough   [place] [facility] Jackolyn Confer, MD   9 months ago Hypertension associated with diabetes [facility])   [place] [facility] Larae Grooms, NP       Future Appointments             In 2 months Larae Grooms, NP [place] [facility], [facility]

## 2024-02-04 NOTE — Telephone Encounter (Signed)
 Copied from CRM [id number]. Topic: Clinical - Medication Refill >> Feb 04, 2024  3:36 PM Meredith Butler wrote: Most Recent Primary Care Visit:  Provider: Larae Grooms  Department[id number]  Visit Type: OFFICE VISIT  Date: 10/30/2023  Medication:  Continuous Glucose Sensor (DEXCOM G7 SENSOR)    Has the patient contacted their pharmacy? Yes Pharmacy stated the script was denied and to contact the PCP  Is this the correct pharmacy for this prescription? Yes If no, delete pharmacy and type the correct one.  This is the patient's preferred pharmacy:  [id number] [place] [place] - [address] [place] [address] Phone: [phone] Fax: [phone]  Has the prescription been filled recently? Yes  Is the patient out of the medication? Yes  Has the patient been seen for an appointment in the last year OR does the patient have an upcoming appointment? Yes  Can we respond through MyChart? Yes  Agent: Please be advised that Rx refills may take up to 3 business days. We ask that you follow-up with your pharmacy.

## 2024-02-04 NOTE — Telephone Encounter (Signed)
 Requested medication (s) are due for refill today: no  Requested medication (s) are on the active medication list: yes  Last refill:  10/22/23 9 ml 1 RF  Future visit scheduled: yes  Notes to clinic:  HgbA1c out of normal range   Requested Prescriptions  Pending Prescriptions Disp Refills   OZEMPIC, 0.25 OR 0.5 MG/DOSE, 2 MG/3ML SOPN [Pharmacy Med Name: OZEMPIC 0.25-0.5 MG/DOSE PEN]  1    Sig: INJECT 0.5 MG INTO THE SKIN ONCE A WEEK. START WITH 0.25MG  ONCE A WEEK X 4 WEEKS, THEN INCREASE TO 0.5MG  WEEKLY.     Endocrinology:  Diabetes - GLP-1 Receptor Agonists - semaglutide Failed - 02/04/2024  8:05 AM      Failed - HBA1C in normal range and within 180 days    HB A1C (BAYER DCA - WAIVED)  Date Value Ref Range Status  12/13/2020 6.8 <7.0 % Final    Comment:                                          Diabetic Adult            <7.0                                       Healthy Adult        4.3 - 5.7                                                           (DCCT/NGSP) American Diabetes Association's Summary of Glycemic Recommendations for Adults with Diabetes: Hemoglobin A1c <7.0%. More stringent glycemic goals (A1c <6.0%) may further reduce complications at the cost of increased risk of hypoglycemia.    Hgb A1c MFr Bld  Date Value Ref Range Status  10/30/2023 7.2 (H) 4.8 - 5.6 % Final    Comment:             Prediabetes: 5.7 - 6.4          Diabetes: >6.4          Glycemic control for adults with diabetes: <7.0          Passed - Cr in normal range and within 360 days    Creatinine, Ser  Date Value Ref Range Status  01/14/2024 0.90 0.44 - 1.00 mg/dL Final         Passed - Valid encounter within last 6 months    Recent Outpatient Visits           3 months ago Type 2 diabetes mellitus with morbid obesity (HCC)   [place] [facility] Larae Grooms, NP   3 months ago COPD exacerbation [facility])   [place] [facility], Sherran Needs,  NP   3 months ago COPD exacerbation [facility])   [place] [facility], Megan P, DO   5 months ago Acute cough   [place] [facility] Jackolyn Confer, MD   9 months ago Hypertension associated with diabetes [facility]    [facility] Larae Grooms, NP       Future Appointments  In 2 months Larae Grooms, NP [place] [facility], [facility]

## 2024-02-06 NOTE — Telephone Encounter (Signed)
 Requested medication (s) are due for refill today: yes  Requested medication (s) are on the active medication list: yes  Last refill:  12/31/23  Future visit scheduled: yes  Notes to clinic:  routing for review, clarification on Sig.     Requested Prescriptions  Pending Prescriptions Disp Refills   Continuous Glucose Sensor (DEXCOM G7 SENSOR) MISC 9 each 1     Endocrinology: Diabetes - Testing Supplies Passed - 02/06/2024  8:37 AM      Passed - Valid encounter within last 12 months    Recent Outpatient Visits           3 months ago Type 2 diabetes mellitus with morbid obesity (HCC)   [place] [facility] Larae Grooms, NP   3 months ago COPD exacerbation [facility])   [place], Sherran Needs, NP   4 months ago COPD exacerbation [facility])   [place] [facility], Megan P, DO   5 months ago Acute cough   [place] [facility] Jackolyn Confer, MD   9 months ago Hypertension associated with diabetes [facility])   [place] [facility] Larae Grooms, NP       Future Appointments             In 2 months Larae Grooms, NP [place] [facility], [facility]

## 2024-02-12 ENCOUNTER — Telehealth: Payer: Self-pay | Admitting: Nurse Practitioner

## 2024-02-12 MED ORDER — DEXCOM G7 SENSOR MISC: 1 refills | Status: DC

## 2024-02-12 NOTE — Telephone Encounter (Signed)
 Copied from CRM [id number]. Topic: Clinical - Prescription Issue >> Feb 12, 2024  9:20 AM Marland Kitchen D wrote: Patient says she called last week about the -Continuous Glucose Sensor (DEXCOM G7 SENSOR) MISC The pharmacy still hasn't gotten it and she is out of it the last sensor will expire in 24 hours.  Preferred pharmacy:  [id number] [place], [place] - [address] [place] [address] Phone: [phone] Fax: [phone] Hours: Not open 24 hours

## 2024-02-12 NOTE — Telephone Encounter (Signed)
 Dexcom sensor sent to the pharmacy.

## 2024-02-12 NOTE — Telephone Encounter (Signed)
 Called and notified patient that a [place] prescription was sent in for her. States she needs a prior authorization.

## 2024-02-13 ENCOUNTER — Encounter: Payer: Self-pay | Admitting: Nurse Practitioner

## 2024-02-13 ENCOUNTER — Telehealth: Payer: Self-pay

## 2024-02-13 NOTE — Telephone Encounter (Signed)
 Please let patient know that I have placed a referral to our pharmacy team to see if there is anything we can do to help her get the Dexcom.

## 2024-02-13 NOTE — Telephone Encounter (Signed)
Called and LVM notifying patient of Karen's message.

## 2024-02-13 NOTE — Progress Notes (Signed)
 Care Guide Pharmacy Note  02/13/2024 Name: Meredith Butler MRN: [id number] DOB: [birth year]  Referred By: Larae Grooms, NP Reason for referral: Care Coordination (Outreach to schedule with Pharm d )   [name] is a 68 y.o. year old female who is a primary care patient of Larae Grooms, NP.  [name] was referred to the pharmacist for assistance related to: DMII  An unsuccessful telephone outreach was attempted today to contact the patient who was referred to the pharmacy team for assistance with medication assistance. Additional attempts will be made to contact the patient.  Penne Lash , RMA     [facility] Guide  Direct Dial: [phone]  Website: [email or web address]

## 2024-02-13 NOTE — Addendum Note (Signed)
 Addended by: Larae Grooms on: 02/13/2024 08:03 AM   Modules accepted: Orders

## 2024-02-20 ENCOUNTER — Other Ambulatory Visit: Payer: Self-pay

## 2024-02-21 ENCOUNTER — Telehealth: Payer: Self-pay

## 2024-02-21 NOTE — Telephone Encounter (Signed)
 Copied from CRM [id number]. Topic: Clinical - Prescription Issue >> Feb 21, 2024  3:36 PM Clayton Bibles wrote: Reason for CRM:  A nurse needs to call [employer] at [phone]. Insurance needs additional information about her Continuous Glucose Sensor (DEXCOM G7 SENSOR) before they will approve this order. Helena has not had the sensor since 02/13/24 and has not checked her blood sugar. Please call Amiel when this is completed. Thanks

## 2024-02-25 ENCOUNTER — Telehealth: Payer: Self-pay | Admitting: Nurse Practitioner

## 2024-02-25 NOTE — Telephone Encounter (Signed)
 Copied from CRM [id number]. Topic: Clinical - Prescription Issue >> Feb 25, 2024  1:22 PM Nyra Capes wrote: Reason for CRM: patient calling in regarding a message on phone stating to go to MyChart to see what the message says. Patient would like a call back  phone [phone] ok to leave a detailed  message.

## 2024-02-25 NOTE — Telephone Encounter (Signed)
 Closing thread, please see 04/04/02025.

## 2024-02-25 NOTE — Telephone Encounter (Signed)
 Ached out directly to insurance. The Dexcom was denied as patient no longer meets the requirements of her plan.  1- Once daily insulin 2- Pregnant and DM 3- Multiple hypoglycemic episodes  Insurance is faxing that information along with the option for an appeal but patient no longer meets those requirements. I then called patient to update her and she said she would not being switching to a regular monitor and checking daily 3 times. She thanked me for the call and disconnected.

## 2024-03-04 ENCOUNTER — Ambulatory Visit
Admission: RE | Admit: 2024-03-04 | Discharge: 2024-03-04 | Disposition: A | Discharge status: Home / Self Care | Payer: BC Managed Care – PPO | Source: Ambulatory Visit | Attending: Acute Care | Admitting: Acute Care

## 2024-03-04 DIAGNOSIS — Z87891 Personal history of nicotine dependence: Secondary | ICD-10-CM | POA: Diagnosis present

## 2024-03-04 DIAGNOSIS — Z122 Encounter for screening for malignant neoplasm of respiratory organs: Secondary | ICD-10-CM | POA: Insufficient documentation

## 2024-03-28 ENCOUNTER — Other Ambulatory Visit: Payer: Self-pay | Admitting: Nurse Practitioner

## 2024-03-31 NOTE — Telephone Encounter (Signed)
 Requested Prescriptions  Pending Prescriptions Disp Refills   Semaglutide ,0.25 or 0.5MG /DOS, (OZEMPIC , 0.25 OR 0.5 MG/DOSE,) 2 MG/3ML SOPN [Pharmacy Med Name: OZEMPIC  0.25-0.5 MG/DOSE PEN] 9 mL 1    Sig: INJECT 0.5 MG INTO THE SKIN ONCE A WEEK. START WITH 0.25MG  ONCE A WEEK X 4 WEEKS, THEN INCREASE TO 0.5MG  WEEKLY.     Endocrinology:  Diabetes - GLP-1 Receptor Agonists - semaglutide  Failed - 03/31/2024 10:47 AM      Failed - HBA1C in normal range and within 180 days    HB A1C (BAYER DCA - WAIVED)  Date Value Ref Range Status  12/13/2020 6.8 <7.0 % Final    Comment:                                          Diabetic Adult            <7.0                                       Healthy Adult        4.3 - 5.7                                                           (DCCT/NGSP) American Diabetes Association's Summary of Glycemic Recommendations for Adults with Diabetes: Hemoglobin A1c <7.0%. More stringent glycemic goals (A1c <6.0%) may further reduce complications at the cost of increased risk of hypoglycemia.    Hgb A1c MFr Bld  Date Value Ref Range Status  10/30/2023 7.2 (H) 4.8 - 5.6 % Final    Comment:             Prediabetes: 5.7 - 6.4          Diabetes: >6.4          Glycemic control for adults with diabetes: <7.0          Failed - Valid encounter within last 6 months    Recent Outpatient Visits   None     Future Appointments             In 4 weeks Meredith Alexanders, NP [place] [facility]            Passed - Cr in normal range and within 360 days    Creatinine, Ser  Date Value Ref Range Status  01/14/2024 0.90 0.44 - 1.00 mg/dL Final

## 2024-04-02 ENCOUNTER — Other Ambulatory Visit: Payer: Self-pay

## 2024-04-02 DIAGNOSIS — Z122 Encounter for screening for malignant neoplasm of respiratory organs: Secondary | ICD-10-CM

## 2024-04-02 DIAGNOSIS — Z87891 Personal history of nicotine dependence: Secondary | ICD-10-CM

## 2024-04-29 ENCOUNTER — Encounter: Payer: Self-pay | Admitting: Nurse Practitioner

## 2024-04-29 ENCOUNTER — Ambulatory Visit (INDEPENDENT_AMBULATORY_CARE_PROVIDER_SITE_OTHER): Payer: Self-pay | Admitting: Nurse Practitioner

## 2024-04-29 DIAGNOSIS — Z780 Asymptomatic menopausal state: Secondary | ICD-10-CM

## 2024-04-29 DIAGNOSIS — J432 Centrilobular emphysema: Secondary | ICD-10-CM

## 2024-04-29 DIAGNOSIS — I700 Atherosclerosis of aorta: Secondary | ICD-10-CM

## 2024-04-29 DIAGNOSIS — F339 Major depressive disorder, recurrent, unspecified: Secondary | ICD-10-CM | POA: Diagnosis not present

## 2024-04-29 DIAGNOSIS — E1159 Type 2 diabetes mellitus with other circulatory complications: Secondary | ICD-10-CM

## 2024-04-29 DIAGNOSIS — E1169 Type 2 diabetes mellitus with other specified complication: Secondary | ICD-10-CM

## 2024-04-29 DIAGNOSIS — Z1231 Encounter for screening mammogram for malignant neoplasm of breast: Secondary | ICD-10-CM

## 2024-04-29 DIAGNOSIS — I152 Hypertension secondary to endocrine disorders: Secondary | ICD-10-CM

## 2024-04-29 DIAGNOSIS — E785 Hyperlipidemia, unspecified: Secondary | ICD-10-CM

## 2024-04-29 MED ORDER — AMLODIPINE BESYLATE 5 MG PO TABS: 5.0000 mg | ORAL_TABLET | Freq: Every day | ORAL | 1 refills | Status: DC

## 2024-04-29 MED ORDER — ACCU-CHEK AVIVA PLUS W/DEVICE KIT: 1.0000 | PACK | Freq: Two times a day (BID) | 0 refills | Status: AC

## 2024-04-29 MED ORDER — LISINOPRIL-HYDROCHLOROTHIAZIDE 20-25 MG PO TABS: 1.0000 | ORAL_TABLET | Freq: Every day | ORAL | 1 refills | Status: DC

## 2024-04-29 MED ORDER — OZEMPIC (0.25 OR 0.5 MG/DOSE) 2 MG/3ML ~~LOC~~ SOPN: 0.5000 mg | PEN_INJECTOR | SUBCUTANEOUS | 1 refills | Status: DC

## 2024-04-29 MED ORDER — LISINOPRIL 20 MG PO TABS: 20.0000 mg | ORAL_TABLET | Freq: Every day | ORAL | 1 refills | Status: DC

## 2024-04-29 MED ORDER — EZETIMIBE 10 MG PO TABS: 10.0000 mg | ORAL_TABLET | Freq: Every day | ORAL | 0 refills | Status: DC

## 2024-04-29 MED ORDER — ACCU-CHEK AVIVA PLUS VI STRP: 1.0000 | ORAL_STRIP | Freq: Two times a day (BID) | 3 refills | Status: AC

## 2024-04-29 MED ORDER — ACCU-CHEK SOFTCLIX LANCETS MISC
1.0000 | Freq: Two times a day (BID) | 3 refills | Status: AC
Start: 2024-04-29 — End: ?

## 2024-04-29 MED ORDER — METFORMIN HCL 1000 MG PO TABS: ORAL_TABLET | ORAL | 1 refills | Status: DC

## 2024-04-29 NOTE — Patient Instructions (Signed)
 You have an order for:  []   2D Mammogram  [x]   3D Mammogram  [x]   Bone Density     Please call for appointment:  [facility]  [address] [place] [address] [phone] [facility] [address] [place], [place] [address] [phone] [facility] [address] [place], [place] [address] [phone]   Make sure to wear two-piece clothing.  No lotions, powders, or deodorants the day of the appointment. Make sure to bring picture ID and insurance card.  Bring list of medications you are currently taking including any supplements.   Schedule your [employer] screening mammogram through MyChart!   Log into your MyChart account.  Go to 'Visit' (or 'Appointments' if on mobile App) --> Schedule an Appointment  Under 'Select a Reason for Visit' choose the Mammogram Screening option.  Complete the pre-visit questions and select the time and place that best fits your schedule.

## 2024-04-29 NOTE — Assessment & Plan Note (Signed)
Chronic.  Controlled.  Continue with current medication regimen of Zetia '10mg'$ . Does not tolerate statins.  Refills sent today.  Labs ordered today. Return to clinic in 6 months for reevaluation.  Call sooner if concerns arise.

## 2024-04-29 NOTE — Progress Notes (Signed)
 BP 116/73 (BP Location: Left Arm, Patient Position: Sitting, Cuff Size: Large)   Pulse 83   Temp 98.7 F (37.1 C) (Oral)   Resp 15   Ht 5' 7.99 (1.727 m)   Wt 223 lb 9.6 oz (101.4 kg)   SpO2 98%   BMI 34.01 kg/m    Subjective:    Patient ID: Meredith Butler, female    DOB: [birth year], 68 y.o.   MRN: [id number]  HPI: Meredith Butler is a 68 y.o. female  Chief Complaint  Patient presents with   Hypertension    Home checks ranging around 115/76, however was high at home yesterday.    Diabetes   Dizziness    About a month. Can happen at random times and last for about a minute.    HYPERTENSION Hypertension status: controlled  Satisfied with current treatment? yes Duration of hypertension: years BP monitoring frequency:  daily BP range: <115/80- sometimes 120/80 BP medication side effects:  no Medication compliance: excellent compliance Previous BP meds:amlodipine , lisinopril , and lisinopril -HCTZ Aspirin : yes Recurrent headaches: no Visual changes: no Palpitations: no Dyspnea: yes Chest pain: no Lower extremity edema: no Dizzy/lightheaded: sometimes  DIABETES Patient stats she is doing well with the Ozempic .  She is doing the Ozempic  0.5mg .  She is also taking Metformin .   Hypoglycemic episodes: no Polydipsia/polyuria: no Visual disturbance: no Chest pain: no Paresthesias: no Glucose Monitoring: no  Accucheck frequency: not checking  Fasting glucose:   Post prandial:  Evening:  Before meals: Taking Insulin?: no  Long acting insulin:  Short acting insulin: Blood Pressure Monitoring: daily Retinal Examination: Up to Date Foot Exam: Up to Date Diabetic Education: Not Completed Pneumovax: Not up to Date Influenza: Not up to Date Aspirin : no  COPD Not using her Breztri . Felt like it didn't  COPD status: controlled Satisfied with current treatment?: yes Oxygen use: no Dyspnea frequency: once daily or 1x every two days Cough frequency: sometimes Rescue inhaler  frequency:  once every other day Limitation of activity: yes Productive cough: Sometimes  Last Spirometry:  Pneumovax: Not up to Date Influenza: Not up to Date    Relevant past medical, surgical, family and social history reviewed and updated as indicated. Interim medical history since our last visit reviewed. Allergies and medications reviewed and updated.  Review of Systems  Eyes:  Negative for visual disturbance.  Respiratory:  Positive for shortness of breath. Negative for cough and chest tightness.   Cardiovascular:  Negative for chest pain, palpitations and leg swelling.  Endocrine: Negative for polydipsia and polyuria.  Neurological:  Positive for dizziness. Negative for numbness and headaches.    Per HPI unless specifically indicated above     Objective:    BP 116/73 (BP Location: Left Arm, Patient Position: Sitting, Cuff Size: Large)   Pulse 83   Temp 98.7 F (37.1 C) (Oral)   Resp 15   Ht 5' 7.99 (1.727 m)   Wt 223 lb 9.6 oz (101.4 kg)   SpO2 98%   BMI 34.01 kg/m   Wt Readings from Last 3 Encounters:  04/29/24 223 lb 9.6 oz (101.4 kg)  03/04/24 227 lb (103 kg)  10/30/23 224 lb 12.8 oz (102 kg)    Physical Exam Vitals and nursing note reviewed.  Constitutional:      General: She is not in acute distress.    Appearance: Normal appearance. She is normal weight. She is not ill-appearing, toxic-appearing or diaphoretic.  HENT:     Head: Normocephalic.  Right Ear: External ear normal.     Left Ear: External ear normal.     Nose: Nose normal.     Mouth/Throat:     Mouth: Mucous membranes are moist.     Pharynx: Oropharynx is clear.  Eyes:     General:        Right eye: No discharge.        Left eye: No discharge.     Extraocular Movements: Extraocular movements intact.     Conjunctiva/sclera: Conjunctivae normal.     Pupils: Pupils are equal, round, and reactive to light.  Cardiovascular:     Rate and Rhythm: Normal rate and regular rhythm.      Heart sounds: No murmur heard. Pulmonary:     Effort: Pulmonary effort is normal. No respiratory distress.     Breath sounds: Normal breath sounds. No wheezing or rales.  Musculoskeletal:     Cervical back: Normal range of motion and neck supple.  Skin:    General: Skin is warm and dry.     Capillary Refill: Capillary refill takes less than 2 seconds.  Neurological:     General: No focal deficit present.     Mental Status: She is alert and oriented to person, place, and time. Mental status is at baseline.  Psychiatric:        Mood and Affect: Mood normal.        Behavior: Behavior normal.        Thought Content: Thought content normal.        Judgment: Judgment normal.     Results for orders placed or performed during the hospital encounter of 01/14/24  I-STAT creatinine   Collection Time: 01/14/24 10:33 AM  Result Value Ref Range   Creatinine, Ser 0.90 0.44 - 1.00 mg/dL      Assessment & Plan:   Problem List Items Addressed This Visit       Cardiovascular and Mediastinum   Aortic atherosclerosis (HCC)   Chronic.  Controlled.  Continue with current medication regimen of Zetia  10mg . Does not tolerate statins.  Refills sent today.  Labs ordered today. Return to clinic in 6 months for reevaluation.  Call sooner if concerns arise.       Relevant Medications   amLODipine  (NORVASC ) 5 MG tablet   ezetimibe  (ZETIA ) 10 MG tablet   lisinopril  (ZESTRIL ) 20 MG tablet   lisinopril -hydrochlorothiazide  (ZESTORETIC ) 20-25 MG tablet   Hypertension associated with diabetes (HCC)   Chronic.  Controlled.  Continue with current medication regimen of Amlodipine  5mg , Lisinoprol 25mg  (she is taking a combo Lisinopril /hydrochlorothiazide  20 and 25mg  plus a Lisinopril  5mg  tab), and HCTZ 25mg .  Well controlled at home.   If needed can increase Amlodipine  to 10mg .  Continue to check blood pressures at home.  Labs ordered today.  Return to clinic in 6 months for reevaluation.  Call sooner if concerns  arise.       Relevant Medications   amLODipine  (NORVASC ) 5 MG tablet   ezetimibe  (ZETIA ) 10 MG tablet   lisinopril  (ZESTRIL ) 20 MG tablet   lisinopril -hydrochlorothiazide  (ZESTORETIC ) 20-25 MG tablet   metFORMIN  (GLUCOPHAGE ) 1000 MG tablet   Semaglutide ,0.25 or 0.5MG /DOS, (OZEMPIC , 0.25 OR 0.5 MG/DOSE,) 2 MG/3ML SOPN   Other Relevant Orders   Comprehensive metabolic panel with GFR     Respiratory   Centrilobular emphysema (HCC)   Chronic.  Controlled.  Does not want to use Breztri .  Declined other inhalers at this time.  Labs ordered today.  Return to  clinic in 6 months for reevaluation.  Call sooner if concerns arise.         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 Well controlled.  Last 7.2%.  Continue with Metformin  and Ozempic  0.5mg  weekly.  Patient is doing very well with Ozempic .  Not able to CGM due to insurance.  Would like to have diabetic testing supplies on hand to spot check. Labs ordered at visit today.  Follow up in 6 months.  Call sooner if concerns arise.       Relevant Medications   lisinopril  (ZESTRIL ) 20 MG tablet   lisinopril -hydrochlorothiazide  (ZESTORETIC ) 20-25 MG tablet   metFORMIN  (GLUCOPHAGE ) 1000 MG tablet   Semaglutide ,0.25 or 0.5MG /DOS, (OZEMPIC , 0.25 OR 0.5 MG/DOSE,) 2 MG/3ML SOPN   Accu-Chek Softclix Lancets lancets   Blood Glucose Monitoring Suppl (ACCU-CHEK AVIVA PLUS) w/Device KIT   glucose blood (ACCU-CHEK AVIVA PLUS) test strip   Other Relevant Orders   Hemoglobin A1c   Hyperlipidemia associated with type 2 diabetes mellitus (HCC)   Chronic.  Controlled.  Continue with current medication regimen on Zetia  10mg .  Does not tolerate statins.  Return to clinic in 4 months for reevaluation.  Call sooner if concerns arise.       Relevant Medications   amLODipine  (NORVASC ) 5 MG tablet   ezetimibe  (ZETIA ) 10 MG tablet   lisinopril  (ZESTRIL ) 20 MG tablet   lisinopril -hydrochlorothiazide  (ZESTORETIC ) 20-25  MG tablet   metFORMIN  (GLUCOPHAGE ) 1000 MG tablet   Semaglutide ,0.25 or 0.5MG /DOS, (OZEMPIC , 0.25 OR 0.5 MG/DOSE,) 2 MG/3ML SOPN   Other Relevant Orders   Lipid panel     Other   Morbid obesity (HCC)   Recommended eating smaller high protein, low fat meals more frequently and exercising 30 mins a day 5 times a week with a goal of 10-15lb weight loss in the next 3 months.       Relevant Medications   metFORMIN  (GLUCOPHAGE ) 1000 MG tablet   Semaglutide ,0.25 or 0.5MG /DOS, (OZEMPIC , 0.25 OR 0.5 MG/DOSE,) 2 MG/3ML SOPN   Depression, recurrent (HCC)   Chronic.  Controlled without medication at this time.  Return to clinic in 6 months for reevaluation.  Call sooner if concerns arise.       Other Visit Diagnoses       Encounter for screening mammogram for malignant neoplasm of breast       Relevant Orders   MM 3D SCREENING MAMMOGRAM BILATERAL BREAST     Post-menopausal       Relevant Orders   DG Bone Density         Follow up plan: Return in about 6 months (around 10/29/2024) for HTN, HLD, DM2 FU.

## 2024-04-29 NOTE — Assessment & Plan Note (Signed)
Chronic.  Controlled.  Continue with current medication regimen on Zetia 10mg .  Does not tolerate statins.  Return to clinic in 4 months for reevaluation.  Call sooner if concerns arise.

## 2024-04-29 NOTE — Assessment & Plan Note (Signed)
 Chronic. Well controlled.  Last 7.2%.  Continue with Metformin  and Ozempic  0.5mg  weekly.  Patient is doing very well with Ozempic .  Not able to CGM due to insurance.  Would like to have diabetic testing supplies on hand to spot check. Labs ordered at visit today.  Follow up in 6 months.  Call sooner if concerns arise.

## 2024-04-29 NOTE — Assessment & Plan Note (Signed)
Chronic.  Controlled without medication at this time.  Return to clinic in 6 months for reevaluation.  Call sooner if concerns arise.  ° °

## 2024-04-29 NOTE — Assessment & Plan Note (Signed)
 Chronic.  Controlled.  Does not want to use Breztri .  Declined other inhalers at this time.  Labs ordered today.  Return to clinic in 6 months for reevaluation.  Call sooner if concerns arise.

## 2024-04-29 NOTE — Assessment & Plan Note (Signed)
 Chronic.  Controlled.  Continue with current medication regimen of Amlodipine 5mg , Lisinoprol 25mg  (she is taking a combo Lisinopril/hydrochlorothiazide 20 and 25mg  plus a Lisinopril 5mg  tab), and HCTZ 25mg .  Well controlled at home.   If needed can increase Amlodipine to 10mg .  Continue to check blood pressures at home.  Labs ordered today.  Return to clinic in 6 months for reevaluation.  Call sooner if concerns arise.

## 2024-04-29 NOTE — Assessment & Plan Note (Signed)
 Recommended eating smaller high protein, low fat meals more frequently and exercising 30 mins a day 5 times a week with a goal of 10-15lb weight loss in the next 3 months.

## 2024-04-30 ENCOUNTER — Telehealth: Payer: Self-pay

## 2024-04-30 ENCOUNTER — Ambulatory Visit: Payer: Self-pay | Admitting: Nurse Practitioner

## 2024-04-30 LAB — COMPREHENSIVE METABOLIC PANEL WITH GFR
ALT: 39 IU/L — ABNORMAL HIGH (ref 0–32)
AST: 30 IU/L (ref 0–40)
Albumin: 4.5 g/dL (ref 3.9–4.9)
Alkaline Phosphatase: 65 IU/L (ref 44–121)
BUN/Creatinine Ratio: 16 (ref 12–28)
BUN: 15 mg/dL (ref 8–27)
Bilirubin Total: 0.6 mg/dL (ref 0.0–1.2)
CO2: 21 mmol/L (ref 20–29)
Calcium: 10 mg/dL (ref 8.7–10.3)
Chloride: 98 mmol/L (ref 96–106)
Creatinine, Ser: 0.94 mg/dL (ref 0.57–1.00)
Globulin, Total: 2.4 g/dL (ref 1.5–4.5)
Glucose: 211 mg/dL — ABNORMAL HIGH (ref 70–99)
Potassium: 4.1 mmol/L (ref 3.5–5.2)
Sodium: 140 mmol/L (ref 134–144)
Total Protein: 6.9 g/dL (ref 6.0–8.5)
eGFR: 66 mL/min/{1.73_m2} (ref >59)

## 2024-04-30 LAB — LIPID PANEL
Chol/HDL Ratio: 3.7 ratio (ref 0.0–4.4)
Cholesterol, Total: 148 mg/dL (ref 100–199)
HDL: 40 mg/dL (ref >39)
LDL Chol Calc (NIH): 59 mg/dL (ref 0–99)
Triglycerides: 314 mg/dL — ABNORMAL HIGH (ref 0–149)
VLDL Cholesterol Cal: 49 mg/dL — ABNORMAL HIGH (ref 5–40)

## 2024-04-30 LAB — HEMOGLOBIN A1C
Est. average glucose Bld gHb Est-mCnc: 143 mg/dL
Hgb A1c MFr Bld: 6.6 % — ABNORMAL HIGH (ref 4.8–5.6)

## 2024-04-30 NOTE — Telephone Encounter (Signed)
 Copied from CRM [id number]. Topic: Clinical - Lab/Test Results >> Apr 30, 2024 10:10 AM Crispin Dolphin wrote: Reason for CRM: Patient returned missed call for labs. Read note as written by provider. Patient understood. No Further questions at this time. Thank You.

## 2024-05-13 ENCOUNTER — Telehealth: Payer: Self-pay

## 2024-05-13 NOTE — Telephone Encounter (Signed)
 Do you know what this testing is the patient is asking about?

## 2024-05-13 NOTE — Telephone Encounter (Signed)
 I do not.  Is she asking about Fragile X?  Can you call and get more information? Find out what she is looking to test.

## 2024-05-13 NOTE — Telephone Encounter (Signed)
 Copied from CRM [id number]. Topic: General - Other >> May 13, 2024  2:46 PM Emylou G wrote: Reason for CRM: Pls call patient looking to do Factor X syndrome test?

## 2024-05-14 NOTE — Telephone Encounter (Signed)
 Called and LVM asking for patient to please return my call.   OK for E2C2 to speak to patient and gather more information. Please find out what the test is that the patient is asking about and what she is wanting to test for. See providers message below.

## 2024-05-15 NOTE — Telephone Encounter (Signed)
 Copied from CRM [id number]. Topic: Clinical - Request for Lab/Test Order >> May 14, 2024  5:19 PM Delon DASEN wrote: Reason for CRM: returning call to [place]- wants gnome study to test for Fragile X- Please call [phone]

## 2024-05-18 NOTE — Telephone Encounter (Signed)
 That is not something that we check here.  Insurance likely would not cover it.  I can send her to genetics to discuss it with them as well as her other concerns.

## 2024-05-18 NOTE — Telephone Encounter (Signed)
 Called and LVM asking for patient to please return my call.   OK for E2C2 to speak to patient and advise her of Karen's message.

## 2024-05-19 NOTE — Telephone Encounter (Signed)
 Called and notified patient of Karen's message. Patient asked how she would know if her insurance would cover it and I advised patient that she would need to contact her insurance to see if this is something they would cover. Patient verbalized understanding.

## 2024-06-04 ENCOUNTER — Ambulatory Visit (INDEPENDENT_AMBULATORY_CARE_PROVIDER_SITE_OTHER): Payer: Self-pay | Admitting: Emergency Medicine

## 2024-06-04 ENCOUNTER — Ambulatory Visit
Admission: RE | Admit: 2024-06-04 | Discharge: 2024-06-04 | Disposition: A | Discharge status: Home / Self Care | Source: Ambulatory Visit | Attending: Nurse Practitioner | Admitting: Nurse Practitioner

## 2024-06-04 VITALS — Ht 69.0 in | Wt 223.0 lb

## 2024-06-04 DIAGNOSIS — Z1231 Encounter for screening mammogram for malignant neoplasm of breast: Secondary | ICD-10-CM | POA: Diagnosis present

## 2024-06-04 DIAGNOSIS — Z0000 Encounter for general adult medical examination without abnormal findings: Secondary | ICD-10-CM | POA: Diagnosis not present

## 2024-06-04 NOTE — Progress Notes (Signed)
 Subjective:   Meredith Butler is a 68 y.o. who presents for a Medicare Wellness preventive visit.  As a reminder, Annual Wellness Visits don't include a physical exam, and some assessments may be limited, especially if this visit is performed virtually. We may recommend an in-person follow-up visit with your provider if needed.  Visit Complete: Virtual I connected with  Meredith Butler on 06/04/24 by a audio enabled telemedicine application and verified that I am speaking with the correct person using two identifiers.  Patient Location: Home  Provider Location: Home Office  I discussed the limitations of evaluation and management by telemedicine. The patient expressed understanding and agreed to proceed.  Vital Signs: Because this visit was a virtual/telehealth visit, some criteria may be missing or patient reported. Any vitals not documented were not able to be obtained and vitals that have been documented are patient reported.  VideoDeclined- This patient declined Librarian, academic. Therefore the visit was completed with audio only.  Persons Participating in Visit: Patient.  AWV Questionnaire: No: Patient Medicare AWV questionnaire was not completed prior to this visit.  Cardiac Risk Factors include: advanced age (>56men, >33 women);dyslipidemia;hypertension;diabetes mellitus;obesity (BMI >30kg/m2)     Objective:    Today's Vitals   06/04/24 1545  Weight: 223 lb (101.2 kg)  Height: 5' 9 (1.753 m)   Body mass index is 32.93 kg/m.     06/04/2024    3:55 PM 05/28/2023    8:17 AM 07/11/2022    7:41 AM 07/02/2022    1:26 PM 05/24/2022   10:23 AM 01/05/2020    6:16 AM 01/14/2018    3:39 PM  Advanced Directives  Does Patient Have a Medical Advance Directive? No No No No No No No   Would patient like information on creating a medical advance directive? Yes (MAU/Ambulatory/Procedural Areas - Information given) No - Patient declined  No - Patient declined No -  Patient declined No - Patient declined No - Patient declined      Data saved with a previous flowsheet row definition    Current Medications (verified) Outpatient Encounter Medications as of 06/04/2024  Medication Sig   Accu-Chek Softclix Lancets lancets 1 each by Other route 2 (two) times daily. Use as instructed   albuterol  (VENTOLIN  HFA) 108 (90 Base) MCG/ACT inhaler 2 puffs every 6 hours as needed.   amLODipine  (NORVASC ) 5 MG tablet Take 1 tablet (5 mg total) by mouth daily.   aspirin  EC 81 MG tablet Take 81 mg by mouth every evening.   Blood Glucose Monitoring Suppl (ACCU-CHEK AVIVA PLUS) w/Device KIT 1 each by Does not apply route 2 (two) times daily.   EPINEPHrine  0.3 mg/0.3 mL IJ SOAJ injection Inject 0.3 mg into the muscle as needed for anaphylaxis.   ezetimibe  (ZETIA ) 10 MG tablet Take 1 tablet (10 mg total) by mouth daily.   glucose blood (ACCU-CHEK AVIVA PLUS) test strip 1 each by Other route in the morning and at bedtime. Use as instructed   lisinopril  (ZESTRIL ) 20 MG tablet Take 1 tablet (20 mg total) by mouth daily.   lisinopril -hydrochlorothiazide  (ZESTORETIC ) 20-25 MG tablet Take 1 tablet by mouth daily.   Magnesium 250 MG TABS Take 250 mg by mouth daily as needed (leg cramps.).    metFORMIN  (GLUCOPHAGE ) 1000 MG tablet TAKE 1 TABLET (1,000 MG TOTAL) BY MOUTH TWICE A DAY WITH FOOD   Semaglutide ,0.25 or 0.5MG /DOS, (OZEMPIC , 0.25 OR 0.5 MG/DOSE,) 2 MG/3ML SOPN Inject 0.5 mg into the skin once a  week.   ONETOUCH VERIO test strip USE TO CHECK BLOOD SUGAR ONCE DAILY (Patient not taking: Reported on 06/04/2024)   pantoprazole  (PROTONIX ) 40 MG tablet Take 1 tablet (40 mg total) by mouth daily. (Patient not taking: Reported on 06/04/2024)   Facility-Administered Encounter Medications as of 06/04/2024  Medication   albuterol  (PROVENTIL ) (2.5 MG/3ML) 0.083% nebulizer solution 2.5 mg    Allergies (verified) Claforan [cefotaxime], Ibuprofen, Omnicef [cefdinir], Other, Penicillins, Sulfa  antibiotics, Tetanus toxoids, Voltaren [diclofenac sodium], Codeine, Diovan [valsartan], Prednisone , Rocephin [ceftriaxone], Statins, and Streptococcus (diplococcus) pneumoniae [streptococci]   History: Past Medical History:  Diagnosis Date   Anxiety    Arthritis    Asthma    COPD (chronic obstructive pulmonary disease) (HCC)    Diabetes mellitus without complication (HCC)    Elevated TSH 12/24/2017   Fibromyalgia    GERD (gastroesophageal reflux disease)    Hypertension    Jaw pain 09/12/2021   Myalgia due to statin 06/18/2020   Peritonitis (HCC)    Pneumonia    Past Surgical History:  Procedure Laterality Date   ABDOMINAL SURGERY     APPENDECTOMY     BREAST BIOPSY Left 04/10/2023   Stereo Bx, Ribbon Clip, Path Pending   BREAST BIOPSY Left 04/10/2023   MM LT BREAST BX W LOC DEV 1ST LESION IMAGE BX SPEC STEREO GUIDE 04/10/2023 [facility]   CHOLECYSTECTOMY     COLON SURGERY     IR RADIOLOGIST EVAL & MGMT  05/31/2022   IR RADIOLOGIST EVAL & MGMT  08/07/2022   IR RADIOLOGIST EVAL & MGMT  11/09/2022   IR RADIOLOGIST EVAL & MGMT  01/29/2024   JOINT REPLACEMENT Bilateral    knee   LEFT HEART CATH AND CORONARY ANGIOGRAPHY N/A 01/05/2020   Procedure: LEFT HEART CATH AND CORONARY ANGIOGRAPHY;  Surgeon: Swaziland, Peter M, MD;  Location: [facility];  Service: Cardiovascular;  Laterality: N/A;   RADIOLOGY WITH ANESTHESIA N/A 07/11/2022   Procedure: Renal Cryo Ablation;  Surgeon: Alona Corners, DO;  Location: [facility];  Service: Radiology;  Laterality: N/A;   SMALL INTESTINE SURGERY     TONSILLECTOMY     TOTAL ABDOMINAL HYSTERECTOMY W/ BILATERAL SALPINGOOPHORECTOMY     complete   Family History  Problem Relation Age of Onset   Heart attack Mother    Cancer Mother    Diabetes Mother    Heart attack Father    Diabetes Sister    Cancer Sister        lung   Diabetes Daughter    Diabetes Daughter    Diabetes Maternal Aunt    Diabetes Maternal Uncle    Cancer Maternal  Grandmother        ovarian   Cancer Paternal Grandfather        colon   Breast cancer Neg Hx    Social History   Socioeconomic History   Marital status: Married    Spouse name: Gladis   Number of children: 4   Years of education: Not on file   Highest education level: Not on file  Occupational History   Occupation: retired  Tobacco Use   Smoking status: Former    Current packs/day: 0.00    Average packs/day: 2.0 packs/day for 30.0 years (60.0 ttl pk-yrs)    Types: Cigarettes    Start date: 11/21/1979    Quit date: 11/20/2009    Years since quitting: 14.5    Passive exposure: Past   Smokeless tobacco: Never  Vaping Use   Vaping status:  Never Used  Substance and Sexual Activity   Alcohol use: No   Drug use: No   Sexual activity: Yes  Other Topics Concern   Not on file  Social History Narrative   Not on file   Social Drivers of Health   Financial Resource Strain: Low Risk  (06/04/2024)   Overall Financial Resource Strain (CARDIA)    Difficulty of Paying Living Expenses: Not hard at all  Food Insecurity: No Food Insecurity (06/04/2024)   Hunger Vital Sign    Worried About Running Out of Food in the Last Year: Never true    Ran Out of Food in the Last Year: Never true  Transportation Needs: No Transportation Needs (06/04/2024)   PRAPARE - Administrator, Civil Service (Medical): No    Lack of Transportation (Non-Medical): No  Physical Activity: Inactive (06/04/2024)   Exercise Vital Sign    Days of Exercise per Week: 0 days    Minutes of Exercise per Session: 0 min  Stress: No Stress Concern Present (06/04/2024)   [employer] of Occupational Health - Occupational Stress Questionnaire    Feeling of Stress: Only a little  Social Connections: Moderately Integrated (06/04/2024)   Social Connection and Isolation Panel    Frequency of Communication with Friends and Family: More than three times a week    Frequency of Social Gatherings with Friends and Family:  Three times a week    Attends Religious Services: More than 4 times per year    Active Member of Clubs or Organizations: No    Attends Banker Meetings: Never    Marital Status: Married    Tobacco Counseling Counseling given: Not Answered    Clinical Intake:  Pre-visit preparation completed: Yes  Pain : No/denies pain     BMI - recorded: 32.93 Nutritional Status: BMI > 30  Obese Nutritional Risks: None Diabetes: Yes CBG done?: No Did pt. bring in CBG monitor from home?: No  Lab Results  Component Value Date   HGBA1C 6.6 (H) 04/29/2024   HGBA1C 7.2 (H) 10/30/2023   HGBA1C 6.7 (H) 05/07/2023     How often do you need to have someone help you when you read instructions, pamphlets, or other written materials from your doctor or pharmacy?: 1 - Never  Interpreter Needed?: No  Information entered by :: Vina Ned, CMA   Activities of Daily Living     06/04/2024    3:47 PM  In your present state of health, do you have any difficulty performing the following activities:  Hearing? 1  Comment wears hearing aids  Vision? 0  Difficulty concentrating or making decisions? 0  Walking or climbing stairs? 0  Dressing or bathing? 0  Doing errands, shopping? 0  Preparing Food and eating ? N  Using the Toilet? N  In the past six months, have you accidently leaked urine? Y  Comment wears pad  Do you have problems with loss of bowel control? Y  Comment wears pad  Managing your Medications? N  Managing your Finances? N  Housekeeping or managing your Housekeeping? N    Patient Care Team: Melvin Pao, NP as PCP - General Arloa Mliss RAMAN, [facility] (Inactive) (Pharmacist) Arloa Mliss RAMAN, [facility] (Inactive) (Pharmacist)  I have updated your Care Teams any recent Medical Services you may have received from other providers in the past year.     Assessment:   This is a routine wellness examination for [place].  Hearing/Vision screen Hearing Screening - Comments::  Wears hearing aids Vision Screening - Comments:: Needs DM eye exam. Patient will call to schedule an eye exam    Goals Addressed               This Visit's Progress     Weight (lb) < 150 lb (68 kg) (pt-stated)   223 lb (101.2 kg)      Depression Screen     06/04/2024    3:53 PM 04/29/2024   10:19 AM 10/22/2023   10:45 AM 08/14/2023   10:29 AM 05/28/2023    8:16 AM 05/07/2023   10:56 AM 02/15/2023   10:38 AM  PHQ 2/9 Scores  PHQ - 2 Score 0 0 0 2 0 0 0  PHQ- 9 Score 2 0 3 7 0 3 1    Fall Risk     06/04/2024    3:57 PM 04/29/2024   10:18 AM 08/14/2023   10:29 AM 05/28/2023    8:18 AM 05/07/2023   10:56 AM  Fall Risk   Falls in the past year? 0 0 0 1 0  Number falls in past yr: 0 0 0 0 0  Injury with Fall? 0 0 0 0 0  Risk for fall due to : No Fall Risks No Fall Risks No Fall Risks History of fall(s) No Fall Risks  Follow up Falls evaluation completed  Falls evaluation completed Falls prevention discussed;Falls evaluation completed Falls evaluation completed    MEDICARE RISK AT HOME:  Medicare Risk at Home Any stairs in or around the home?: Yes If so, are there any without handrails?: No Home free of loose throw rugs in walkways, pet beds, electrical cords, etc?: Yes Adequate lighting in your home to reduce risk of falls?: Yes Life alert?: No Use of a cane, walker or w/c?: No Grab bars in the bathroom?: Yes Shower chair or bench in shower?: Yes Elevated toilet seat or a handicapped toilet?: Yes  TIMED UP AND GO:  Was the test performed?  No  Cognitive Function: 6CIT completed        06/04/2024    3:58 PM 05/28/2023    8:20 AM 05/24/2022   10:04 AM  6CIT Screen  What Year? 0 points 0 points 0 points  What month? 0 points 0 points 0 points  What time? 0 points 0 points 0 points  Count back from 20 0 points 0 points 0 points  Months in reverse 0 points 0 points 0 points  Repeat phrase 2 points 0 points 0 points  Total Score 2 points 0 points 0 points     Immunizations  There is no immunization history on file for this patient.  Screening Tests Health Maintenance  Topic Date Due   COVID-19 Vaccine (1) Never done   DEXA SCAN  Never done   FOOT EXAM  11/02/2023   OPHTHALMOLOGY EXAM  11/30/2023   MAMMOGRAM  03/26/2024   Zoster Vaccines- Shingrix (1 of 2) 07/30/2024 (Originally 04/29/1975)   Pneumococcal Vaccine: 50+ Years (1 of 2 - PCV) 04/29/2025 (Originally 04/29/1975)   INFLUENZA VACCINE  06/19/2024   Diabetic kidney evaluation - Urine ACR  10/29/2024   HEMOGLOBIN A1C  10/29/2024   Lung Cancer Screening  03/04/2025   Diabetic kidney evaluation - eGFR measurement  04/29/2025   Medicare Annual Wellness (AWV)  06/04/2025   Fecal DNA (Cologuard)  10/22/2025   Hepatitis C Screening  Completed   Hepatitis B Vaccines  Aged Out   HPV VACCINES  Aged Out   Meningococcal  B Vaccine  Aged Out   DTaP/Tdap/Td  Discontinued    Health Maintenance  Health Maintenance Due  Topic Date Due   COVID-19 Vaccine (1) Never done   DEXA SCAN  Never done   FOOT EXAM  11/02/2023   OPHTHALMOLOGY EXAM  11/30/2023   MAMMOGRAM  03/26/2024   Health Maintenance Items Addressed: See Nurse Notes at the end of this note  Additional Screening:  Vision Screening: Recommended annual ophthalmology exams for early detection of glaucoma and other disorders of the eye. Would you like a referral to an eye doctor? No    Dental Screening: Recommended annual dental exams for proper oral hygiene  Community Resource Referral / Chronic Care Management: CRR required this visit?  No   CCM required this visit?  No   Plan:    I have personally reviewed and noted the following in the patient's chart:   Medical and social history Use of alcohol, tobacco or illicit drugs  Current medications and supplements including opioid prescriptions. Patient is not currently taking opioid prescriptions. Functional ability and status Nutritional status Physical  activity Advanced directives List of other physicians Hospitalizations, surgeries, and ER visits in previous 12 months Vitals Screenings to include cognitive, depression, and falls Referrals and appointments  In addition, I have reviewed and discussed with patient certain preventive protocols, quality metrics, and best practice recommendations. A written personalized care plan for preventive services as well as general preventive health recommendations were provided to patient.   Vina Ned, CMA   06/04/2024   After Visit Summary: (Mail) Due to this being a telephonic visit, the after visit summary with patients personalized plan was offered to patient via mail   Notes:  6 CIT Score - 2 Needs DM eye exam. Patient to call and schedule appointment. Needs DEXA scan order placed 04/29/24 Declined Covid, pneumonia and shingles vaccines

## 2024-06-04 NOTE — Patient Instructions (Signed)
 Meredith Butler , Thank you for taking time out of your busy schedule to complete your Annual Wellness Visit with me. I enjoyed our conversation and look forward to speaking with you again next year. I, as well as your care team,  appreciate your ongoing commitment to your health goals. Please review the following plan we discussed and let me know if I can assist you in the future. Your Game plan/ To Do List    Referrals: None   Follow up Visits: Next Medicare AWV with our clinical staff: 06/17/25 @ 3:10pm (PHONE VISIT) please allow 40 minutes for your appointment.   Have you seen your provider in the last 6 months (3 months if uncontrolled diabetes)? Yes Next Office Visit with your provider: 10/29/24 @ 10:20am with Darice Petty, NP  Clinician Recommendations: Get a diabetic eye exam at your earliest convenience. You should have this every year.  Aim for 30 minutes of exercise or brisk walking, 6-8 glasses of water, and 5 servings of fruits and vegetables each day.  Please call to schedule your bone density scan:  [facility] Address: [address], [place], [place] Phone: [phone]       This is a list of the screening recommended for you and due dates:  Health Maintenance  Topic Date Due   COVID-19 Vaccine (1) Never done   DEXA scan (bone density measurement)  Never done   Complete foot exam   11/02/2023   Eye exam for diabetics  11/30/2023   Mammogram  03/26/2024   Zoster (Shingles) Vaccine (1 of 2) 07/30/2024*   Pneumococcal Vaccine for age over 31 (1 of 2 - PCV) 04/29/2025*   Flu Shot  06/19/2024   Yearly kidney health urinalysis for diabetes  10/29/2024   Hemoglobin A1C  10/29/2024   Screening for Lung Cancer  03/04/2025   Yearly kidney function blood test for diabetes  04/29/2025   Medicare Annual Wellness Visit  06/04/2025   Cologuard (Stool DNA test)  10/22/2025   Hepatitis C Screening  Completed   Hepatitis B Vaccine  Aged  Out   HPV Vaccine  Aged Out   Meningitis B Vaccine  Aged Out   DTaP/Tdap/Td vaccine  Discontinued  *Topic was postponed. The date shown is not the original due date.    Advanced directives: (Provided) Advance directive discussed with you today. I have provided a copy for you to complete at home and have notarized. Once this is complete, please bring a copy in to our office so we can scan it into your chart.  Advance Care Planning is important because it:  [x]  Makes sure you receive the medical care that is consistent with your values, goals, and preferences  [x]  It provides guidance to your family and loved ones and reduces their decisional burden about whether or not they are making the right decisions based on your wishes.  Follow the link provided in your after visit summary or read over the paperwork we have mailed to you to help you started getting your Advance Directives in place. If you need assistance in completing these, please reach out to us  so that we can help you!  See attachments for Preventive Care and Fall Prevention Tips.   Fall Prevention in the Home, Adult Falls can cause injuries and affect people of all ages. There are many simple things that you can do to make your home safe and to help prevent falls. If you need it, ask  for help making these changes. What actions can I take to prevent falls? General information Use good lighting in all rooms. Make sure to: Replace any light bulbs that burn out. Turn on lights if it is dark and use night-lights. Keep items that you use often in easy-to-reach places. Lower the shelves around your home if needed. Move furniture so that there are clear paths around it. Do not keep throw rugs or other things on the floor that can make you trip. If any of your floors are uneven, fix them. Add color or contrast paint or tape to clearly mark and help you see: Grab bars or handrails. First and last steps of staircases. Where the edge of  each step is. If you use a ladder or stepladder: Make sure that it is fully opened. Do not climb a closed ladder. Make sure the sides of the ladder are locked in place. Have someone hold the ladder while you use it. Know where your pets are as you move through your home. What can I do in the bathroom?     Keep the floor dry. Clean up any water that is on the floor right away. Remove soap buildup in the bathtub or shower. Buildup makes bathtubs and showers slippery. Use non-skid mats or decals on the floor of the bathtub or shower. Attach bath mats securely with double-sided, non-slip rug tape. If you need to sit down while you are in the shower, use a non-slip stool. Install grab bars by the toilet and in the bathtub and shower. Do not use towel bars as grab bars. What can I do in the bedroom? Make sure that you have a light by your bed that is easy to reach. Do not use any sheets or blankets on your bed that hang to the floor. Have a firm bench or chair with side arms that you can use for support when you get dressed. What can I do in the kitchen? Clean up any spills right away. If you need to reach something above you, use a sturdy step stool that has a grab bar. Keep electrical cables out of the way. Do not use floor polish or wax that makes floors slippery. What can I do with my stairs? Do not leave anything on the stairs. Make sure that you have a light switch at the top and the bottom of the stairs. Have them installed if you do not have them. Make sure that there are handrails on both sides of the stairs. Fix handrails that are broken or loose. Make sure that handrails are as long as the staircases. Install non-slip stair treads on all stairs in your home if they do not have carpet. Avoid having throw rugs at the top or bottom of stairs, or secure the rugs with carpet tape to prevent them from moving. Choose a carpet design that does not hide the edge of steps on the stairs.  Make sure that carpet is firmly attached to the stairs. Fix any carpet that is loose or worn. What can I do on the outside of my home? Use bright outdoor lighting. Repair the edges of walkways and driveways and fix any cracks. Clear paths of anything that can make you trip, such as tools or rocks. Add color or contrast paint or tape to clearly mark and help you see high doorway thresholds. Trim any bushes or trees on the main path into your home. Check that handrails are securely fastened and in good repair. Both  sides of all steps should have handrails. Install guardrails along the edges of any raised decks or porches. Have leaves, snow, and ice cleared regularly. Use sand, salt, or ice melt on walkways during winter months if you live where there is ice and snow. In the garage, clean up any spills right away, including grease or oil spills. What other actions can I take? Review your medicines with your health care provider. Some medicines can make you confused or feel dizzy. This can increase your chance of falling. Wear closed-toe shoes that fit well and support your feet. Wear shoes that have rubber soles and low heels. Use a cane, walker, scooter, or crutches that help you move around if needed. Talk with your provider about other ways that you can decrease your risk of falls. This may include seeing a physical therapist to learn to do exercises to improve movement and strength. Where to find more information Centers for Disease Control and Prevention, STEADI: TonerPromos.no [employer] on Aging: BaseRingTones.pl National Institute on Aging: BaseRingTones.pl Contact a health care provider if: You are afraid of falling at home. You feel weak, drowsy, or dizzy at home. You fall at home. Get help right away if you: Lose consciousness or have trouble moving after a fall. Have a fall that causes a head injury. These symptoms may be an emergency. Get help right away. Call 911. Do not wait to see if  the symptoms will go away. Do not drive yourself to the hospital. This information is not intended to replace advice given to you by your health care provider. Make sure you discuss any questions you have with your health care provider. Document Revised: 07/09/2022 Document Reviewed: 07/09/2022 Elsevier Patient Education  2024 [employer].

## 2024-06-09 ENCOUNTER — Other Ambulatory Visit: Payer: Self-pay | Admitting: Nurse Practitioner

## 2024-06-09 DIAGNOSIS — R928 Other abnormal and inconclusive findings on diagnostic imaging of breast: Secondary | ICD-10-CM

## 2024-06-15 ENCOUNTER — Ambulatory Visit
Admission: RE | Admit: 2024-06-15 | Discharge: 2024-06-15 | Disposition: A | Discharge status: Home / Self Care | Source: Ambulatory Visit | Attending: Nurse Practitioner | Admitting: Nurse Practitioner

## 2024-06-15 DIAGNOSIS — R928 Other abnormal and inconclusive findings on diagnostic imaging of breast: Secondary | ICD-10-CM | POA: Insufficient documentation

## 2024-06-18 ENCOUNTER — Other Ambulatory Visit (HOSPITAL_COMMUNITY): Payer: Self-pay

## 2024-08-11 ENCOUNTER — Encounter: Payer: Self-pay | Admitting: Nurse Practitioner

## 2024-10-19 ENCOUNTER — Other Ambulatory Visit: Payer: Self-pay | Admitting: Nurse Practitioner

## 2024-10-19 ENCOUNTER — Telehealth: Payer: Self-pay | Admitting: Nurse Practitioner

## 2024-10-19 NOTE — Telephone Encounter (Unsigned)
 Copied from CRM #[id number]. Topic: Clinical - Medication Refill >> Oct 19, 2024  2:40 PM Emylou G wrote: Medication: amLODipine  (NORVASC ) 5 MG tablet  Has the patient contacted their pharmacy? No (Agent: If no, request that the patient contact the pharmacy for the refill. If patient does not wish to contact the pharmacy document the reason why and proceed with request.) (Agent: If yes, when and what did the pharmacy advise?)  This is the patient's preferred pharmacy:  [id number] - [place], [place] - [address] [place] [address] Phone: [phone] Fax: [phone]  Is this the correct pharmacy for this prescription? No If no, delete pharmacy and type the correct one.   Has the prescription been filled recently? No  Is the patient out of the medication? Yes  Has the patient been seen for an appointment in the last year OR does the patient have an upcoming appointment? Yes  Can we respond through MyChart? No  Agent: Please be advised that Rx refills may take up to 3 business days. We ask that you follow-up with your pharmacy.

## 2024-10-22 MED ORDER — AMLODIPINE BESYLATE 5 MG PO TABS: 5.0000 mg | ORAL_TABLET | Freq: Every day | ORAL | 0 refills | Status: DC

## 2024-10-22 NOTE — Telephone Encounter (Signed)
 Requested Prescriptions  Pending Prescriptions Disp Refills   metFORMIN  (GLUCOPHAGE ) 1000 MG tablet [Pharmacy Med Name: METFORMIN  HCL 1,000 MG TABLET] 180 tablet 0    Sig: TAKE 1 TABLET (1,000 MG TOTAL) BY MOUTH TWICE A DAY WITH FOOD     Endocrinology:  Diabetes - Biguanides Failed - 10/22/2024  2:09 PM      Failed - B12 Level in normal range and within 720 days    Vitamin B-12  Date Value Ref Range Status  12/13/2020 462 232 - 1,245 pg/mL Final         Failed - CBC within normal limits and completed in the last 12 months    WBC  Date Value Ref Range Status  07/02/2022 5.6 4.0 - 10.5 K/uL Final   RBC  Date Value Ref Range Status  07/02/2022 4.52 3.87 - 5.11 MIL/uL Final   Hemoglobin  Date Value Ref Range Status  07/02/2022 14.0 12.0 - 15.0 g/dL Final  97/91/7978 85.0 11.1 - 15.9 g/dL Final   HCT  Date Value Ref Range Status  07/02/2022 41.6 36.0 - 46.0 % Final   Hematocrit  Date Value Ref Range Status  12/28/2019 44.2 34.0 - 46.6 % Final   MCHC  Date Value Ref Range Status  07/02/2022 33.7 30.0 - 36.0 g/dL Final   [facility]  Date Value Ref Range Status  07/02/2022 31.0 26.0 - 34.0 pg Final   MCV  Date Value Ref Range Status  07/02/2022 92.0 80.0 - 100.0 fL Final  12/28/2019 90 79 - 97 fL Final   No results found for: PLTCOUNTKUC, LABPLAT, POCPLA RDW  Date Value Ref Range Status  07/02/2022 12.3 11.5 - 15.5 % Final  12/28/2019 12.5 11.7 - 15.4 % Final         Passed - Cr in normal range and within 360 days    Creatinine, Ser  Date Value Ref Range Status  04/29/2024 0.94 0.57 - 1.00 mg/dL Final         Passed - HBA1C is between 0 and 7.9 and within 180 days    HB A1C (BAYER DCA - WAIVED)  Date Value Ref Range Status  12/13/2020 6.8 <7.0 % Final    Comment:                                          Diabetic Adult            <7.0                                       Healthy Adult        4.3 - 5.7                                                            (DCCT/NGSP) American Diabetes Association's Summary of Glycemic Recommendations for Adults with Diabetes: Hemoglobin A1c <7.0%. More stringent glycemic goals (A1c <6.0%) may further reduce complications at the cost of increased risk of hypoglycemia.    Hgb A1c MFr Bld  Date Value Ref Range Status  04/29/2024 6.6 (H) 4.8 - 5.6 % Final  Comment:             Prediabetes: 5.7 - 6.4          Diabetes: >6.4          Glycemic control for adults with diabetes: <7.0          Passed - eGFR in normal range and within 360 days    GFR calc Af Amer  Date Value Ref Range Status  12/13/2020 80 >59 mL/min/1.73 Final    Comment:    **In accordance with recommendations from the NKF-ASN Task force,**   [facility] is in the process of updating its eGFR calculation to the   2021 CKD-EPI creatinine equation that estimates kidney function   without a race variable.    GFR, Estimated  Date Value Ref Range Status  07/02/2022 >60 >60 mL/min Final    Comment:    (NOTE) Calculated using the CKD-EPI Creatinine Equation (2021)    eGFR  Date Value Ref Range Status  04/29/2024 66 >59 mL/min/1.73 Final         Passed - Valid encounter within last 6 months    Recent Outpatient Visits           5 months ago Type 2 diabetes mellitus with morbid obesity [facility])   [facility] Melvin Pao, NP

## 2024-10-22 NOTE — Telephone Encounter (Signed)
 Requested Prescriptions  Pending Prescriptions Disp Refills   amLODipine  (NORVASC ) 5 MG tablet 90 tablet 0    Sig: Take 1 tablet (5 mg total) by mouth daily.     Cardiovascular: Calcium  Channel Blockers 2 Passed - 10/22/2024  2:30 PM      Passed - Last BP in normal range    BP Readings from Last 1 Encounters:  04/29/24 116/73         Passed - Last Heart Rate in normal range    Pulse Readings from Last 1 Encounters:  04/29/24 83         Passed - Valid encounter within last 6 months    Recent Outpatient Visits           5 months ago Type 2 diabetes mellitus with morbid obesity [facility])   [facility] Melvin Pao, NP

## 2024-10-27 ENCOUNTER — Ambulatory Visit: Admitting: Nurse Practitioner

## 2024-10-28 ENCOUNTER — Ambulatory Visit (INDEPENDENT_AMBULATORY_CARE_PROVIDER_SITE_OTHER): Admitting: Nurse Practitioner

## 2024-10-28 ENCOUNTER — Encounter: Payer: Self-pay | Admitting: Nurse Practitioner

## 2024-10-28 DIAGNOSIS — T782XXS Anaphylactic shock, unspecified, sequela: Secondary | ICD-10-CM | POA: Diagnosis not present

## 2024-10-28 DIAGNOSIS — J432 Centrilobular emphysema: Secondary | ICD-10-CM

## 2024-10-28 DIAGNOSIS — E1159 Type 2 diabetes mellitus with other circulatory complications: Secondary | ICD-10-CM | POA: Diagnosis not present

## 2024-10-28 DIAGNOSIS — I700 Atherosclerosis of aorta: Secondary | ICD-10-CM

## 2024-10-28 DIAGNOSIS — E1169 Type 2 diabetes mellitus with other specified complication: Secondary | ICD-10-CM

## 2024-10-28 DIAGNOSIS — F339 Major depressive disorder, recurrent, unspecified: Secondary | ICD-10-CM | POA: Diagnosis not present

## 2024-10-28 DIAGNOSIS — I152 Hypertension secondary to endocrine disorders: Secondary | ICD-10-CM

## 2024-10-28 DIAGNOSIS — R051 Acute cough: Secondary | ICD-10-CM | POA: Diagnosis not present

## 2024-10-28 DIAGNOSIS — E785 Hyperlipidemia, unspecified: Secondary | ICD-10-CM

## 2024-10-28 DIAGNOSIS — Z7985 Long-term (current) use of injectable non-insulin antidiabetic drugs: Secondary | ICD-10-CM | POA: Diagnosis not present

## 2024-10-28 LAB — MICROALBUMIN, URINE WAIVED
Creatinine, Urine Waived: 200 mg/dL (ref 10–300)
Microalb, Ur Waived: 10 mg/L (ref 0–19)
Microalb/Creat Ratio: <30 mg/g (ref <30)

## 2024-10-28 MED ORDER — LISINOPRIL 20 MG PO TABS: 20.0000 mg | ORAL_TABLET | Freq: Every day | ORAL | 1 refills | Status: AC

## 2024-10-28 MED ORDER — LISINOPRIL-HYDROCHLOROTHIAZIDE 20-25 MG PO TABS: 1.0000 | ORAL_TABLET | Freq: Every day | ORAL | 1 refills | Status: AC

## 2024-10-28 MED ORDER — ALBUTEROL SULFATE HFA 108 (90 BASE) MCG/ACT IN AERS: INHALATION_SPRAY | RESPIRATORY_TRACT | 1 refills | Status: AC

## 2024-10-28 MED ORDER — SEMAGLUTIDE (1 MG/DOSE) 4 MG/3ML ~~LOC~~ SOPN: 1.0000 mg | PEN_INJECTOR | SUBCUTANEOUS | 1 refills | Status: AC

## 2024-10-28 MED ORDER — UMECLIDINIUM-VILANTEROL 62.5-25 MCG/ACT IN AEPB: 1.0000 | INHALATION_SPRAY | Freq: Every day | RESPIRATORY_TRACT | 1 refills | Status: AC

## 2024-10-28 MED ORDER — EZETIMIBE 10 MG PO TABS: 10.0000 mg | ORAL_TABLET | Freq: Every day | ORAL | 1 refills | Status: AC

## 2024-10-28 MED ORDER — FREESTYLE LIBRE 3 PLUS SENSOR MISC: 3 refills | Status: AC

## 2024-10-28 MED ORDER — EPINEPHRINE 0.3 MG/0.3ML IJ SOAJ: 0.3000 mg | INTRAMUSCULAR | 2 refills | Status: AC | PRN

## 2024-10-28 MED ORDER — AZITHROMYCIN 250 MG PO TABS: ORAL_TABLET | ORAL | 0 refills | Status: AC

## 2024-10-28 MED ORDER — AMLODIPINE BESYLATE 5 MG PO TABS: 5.0000 mg | ORAL_TABLET | Freq: Every day | ORAL | 1 refills | Status: AC

## 2024-10-28 NOTE — Assessment & Plan Note (Signed)
 Chronic.  Controlled.  Continue with current medication regimen of Amlodipine  5mg , Lisinoprol 20mg  (she is taking a combo Lisinopril /hydrochlorothiazide  20 and 25mg  plus a Lisinopril  20mg  tab), and HCTZ 25mg .  Well controlled at home.   If needed can increase Amlodipine  to 10mg .  Continue to check blood pressures at home.  Labs ordered today.  Return to clinic in 6 months for reevaluation.  Call sooner if concerns arise.

## 2024-10-28 NOTE — Assessment & Plan Note (Signed)
 Chronic. Well controlled.  Last 6.6%.  Continue with Metformin  and Ozempic  increased to 1mg  to aid in weight loss.  Patient is doing very well with Ozempic .  Will send Freestyle Libre to see if it is covered by insurance.   Labs ordered at visit today.  Follow up in 6 months.  Call sooner if concerns arise.

## 2024-10-28 NOTE — Assessment & Plan Note (Signed)
Chronic.  Controlled.  Continue with current medication regimen of Zetia '10mg'$ . Does not tolerate statins.  Refills sent today.  Labs ordered today. Return to clinic in 6 months for reevaluation.  Call sooner if concerns arise.

## 2024-10-28 NOTE — Assessment & Plan Note (Signed)
Chronic.  Controlled without medication at this time.  Return to clinic in 6 months for reevaluation.  Call sooner if concerns arise.  ° °

## 2024-10-28 NOTE — Progress Notes (Signed)
 BP 120/78 (BP Location: Left Arm, Patient Position: Sitting, Cuff Size: Normal)   Pulse 89   Temp 98.1 F (36.7 C) (Oral)   Ht 5' 9.02 (1.753 m)   Wt 223 lb 9.6 oz (101.4 kg)   SpO2 96%   BMI 33.00 kg/m    Subjective:    Patient ID: Meredith Butler, female    DOB: Aug 02, 1956, 68 y.o.   MRN: [id number]  HPI: Meredith Butler is a 68 y.o. female  Chief Complaint  Patient presents with   office visit    6 month F/u.   Sinus Problem    Patient stated she believes she has a sinus infection for the past week. Green mucous and dryness, SOB.   Blood Sugar Problem    Patient checked her blood sugar and it's been high for the last 5 days. Ranging from 148-152 before she eats or drink anything.    HYPERTENSION Hypertension status: controlled  Satisfied with current treatment? yes Duration of hypertension: years BP monitoring frequency:  daily BP range: 120/80 BP medication side effects:  no Medication compliance: excellent compliance Previous BP meds:amlodipine , lisinopril , and lisinopril -HCTZ Aspirin : yes Recurrent headaches: no Visual changes: no Palpitations: no Dyspnea: yes Chest pain: no Lower extremity edema: no Dizzy/lightheaded: sometimes  DIABETES Patient states she is doing well with the Ozempic .  She is doing the Ozempic  0.5mg .  She is also taking Metformin .  Sugars have been in the 150s for the last week.  Hypoglycemic episodes: no Polydipsia/polyuria: no Visual disturbance: no Chest pain: no Paresthesias: no Glucose Monitoring: no  Accucheck frequency: not checking  Fasting glucose:   Post prandial:  Evening:  Before meals: Taking Insulin?: no  Long acting insulin:  Short acting insulin: Blood Pressure Monitoring: daily Retinal Examination: Up to Date Foot Exam: Up to Date Diabetic Education: Not Completed Pneumovax: Not up to Date Influenza: Not up to Date Aspirin : no  COPD Having some SOB when she is walking around the house.  She has been on anoro in  the past and okay with trying it again.  COPD status: controlled Satisfied with current treatment?: yes Oxygen use: no Dyspnea frequency: once daily or 1x every two days Cough frequency: sometimes Rescue inhaler frequency:  once every other day Limitation of activity: yes Productive cough: Sometimes  Last Spirometry:  Pneumovax: Not up to Date Influenza: Not up to Date  UPPER RESPIRATORY TRACT INFECTION Worst symptom: symptoms started a week ago Fever: no Cough: yes Shortness of breath: yes Wheezing: yes Chest pain:no Chest tightness: no Chest congestion: no Nasal congestion: yes Runny nose: yes Post nasal drip: yes Sneezing: yes Sore throat: yes Swollen glands: no Sinus pressure: yes Headache: yes Face pain: yes Toothache: no Ear pain: yes left Ear pressure: yes left Eyes red/itching:no Eye drainage/crusting: no  Vomiting: no Rash: no Fatigue: yes Sick contacts: no Strep contacts: no  Context: stable Recurrent sinusitis: no Relief with OTC cold/cough medications: no  Treatments attempted: none     Relevant past medical, surgical, family and social history reviewed and updated as indicated. Interim medical history since our last visit reviewed. Allergies and medications reviewed and updated.  Review of Systems  Constitutional:  Positive for fatigue. Negative for fever.  HENT:  Positive for congestion, ear pain, postnasal drip, rhinorrhea, sinus pain, sneezing and sore throat. Negative for dental problem and sinus pressure.   Eyes:  Negative for visual disturbance.  Respiratory:  Positive for cough, shortness of breath and wheezing. Negative for chest tightness.  Cardiovascular:  Negative for chest pain, palpitations and leg swelling.  Gastrointestinal:  Negative for vomiting.  Endocrine: Negative for polydipsia and polyuria.  Skin:  Negative for rash.  Neurological:  Positive for dizziness and headaches. Negative for numbness.    Per HPI unless  specifically indicated above     Objective:    BP 120/78 (BP Location: Left Arm, Patient Position: Sitting, Cuff Size: Normal)   Pulse 89   Temp 98.1 F (36.7 C) (Oral)   Ht 5' 9.02 (1.753 m)   Wt 223 lb 9.6 oz (101.4 kg)   SpO2 96%   BMI 33.00 kg/m   Wt Readings from Last 3 Encounters:  10/28/24 223 lb 9.6 oz (101.4 kg)  06/04/24 223 lb (101.2 kg)  04/29/24 223 lb 9.6 oz (101.4 kg)    Physical Exam Vitals and nursing note reviewed.  Constitutional:      General: She is not in acute distress.    Appearance: Normal appearance. She is normal weight. She is not ill-appearing, toxic-appearing or diaphoretic.  HENT:     Head: Normocephalic.     Right Ear: External ear normal. A middle ear effusion is present. Tympanic membrane is erythematous.     Left Ear: External ear normal. A middle ear effusion is present. Tympanic membrane is erythematous.     Nose: Congestion and rhinorrhea present.     Mouth/Throat:     Mouth: Mucous membranes are moist.     Pharynx: Oropharynx is clear. Posterior oropharyngeal erythema present. No oropharyngeal exudate.  Eyes:     General:        Right eye: No discharge.        Left eye: No discharge.     Extraocular Movements: Extraocular movements intact.     Conjunctiva/sclera: Conjunctivae normal.     Pupils: Pupils are equal, round, and reactive to light.  Cardiovascular:     Rate and Rhythm: Normal rate and regular rhythm.     Heart sounds: No murmur heard. Pulmonary:     Effort: Pulmonary effort is normal. No respiratory distress.     Breath sounds: Normal breath sounds. No wheezing or rales.  Musculoskeletal:     Cervical back: Normal range of motion and neck supple.  Skin:    General: Skin is warm and dry.     Capillary Refill: Capillary refill takes less than 2 seconds.  Neurological:     General: No focal deficit present.     Mental Status: She is alert and oriented to person, place, and time. Mental status is at baseline.   Psychiatric:        Mood and Affect: Mood normal.        Behavior: Behavior normal.        Thought Content: Thought content normal.        Judgment: Judgment normal.     Results for orders placed or performed in visit on 04/29/24  Hemoglobin A1c   Collection Time: 04/29/24 10:41 AM  Result Value Ref Range   Hgb A1c MFr Bld 6.6 (H) 4.8 - 5.6 %   Est. average glucose Bld gHb Est-mCnc 143 mg/dL  Comprehensive metabolic panel with GFR   Collection Time: 04/29/24 10:41 AM  Result Value Ref Range   Glucose 211 (H) 70 - 99 mg/dL   BUN 15 8 - 27 mg/dL   Creatinine, Ser 9.05 0.57 - 1.00 mg/dL   eGFR 66 >40 fO/fpw/8.26   BUN/Creatinine Ratio 16 12 - 28   Sodium 140  134 - 144 mmol/L   Potassium 4.1 3.5 - 5.2 mmol/L   Chloride 98 96 - 106 mmol/L   CO2 21 20 - 29 mmol/L   Calcium  10.0 8.7 - 10.3 mg/dL   Total Protein 6.9 6.0 - 8.5 g/dL   Albumin 4.5 3.9 - 4.9 g/dL   Globulin, Total 2.4 1.5 - 4.5 g/dL   Bilirubin Total 0.6 0.0 - 1.2 mg/dL   Alkaline Phosphatase 65 44 - 121 IU/L   AST 30 0 - 40 IU/L   ALT 39 (H) 0 - 32 IU/L  Lipid panel   Collection Time: 04/29/24 10:41 AM  Result Value Ref Range   Cholesterol, Total 148 100 - 199 mg/dL   Triglycerides 685 (H) 0 - 149 mg/dL   HDL 40 >60 mg/dL   VLDL Cholesterol Cal 49 (H) 5 - 40 mg/dL   LDL Chol Calc (NIH) 59 0 - 99 mg/dL   Chol/HDL Ratio 3.7 0.0 - 4.4 ratio      Assessment & Plan:   Problem List Items Addressed This Visit       Cardiovascular and Mediastinum   Aortic atherosclerosis   Chronic.  Controlled.  Continue with current medication regimen of Zetia  10mg . Does not tolerate statins.  Refills sent today.  Labs ordered today. Return to clinic in 6 months for reevaluation.  Call sooner if concerns arise.       Relevant Medications   amLODipine  (NORVASC ) 5 MG tablet   ezetimibe  (ZETIA ) 10 MG tablet   lisinopril  (ZESTRIL ) 20 MG tablet   lisinopril -hydrochlorothiazide  (ZESTORETIC ) 20-25 MG tablet   EPINEPHrine  0.3  mg/0.3 mL IJ SOAJ injection   Hypertension associated with diabetes (HCC)   Chronic.  Controlled.  Continue with current medication regimen of Amlodipine  5mg , Lisinoprol 20mg  (she is taking a combo Lisinopril /hydrochlorothiazide  20 and 25mg  plus a Lisinopril  20mg  tab), and HCTZ 25mg .  Well controlled at home.   If needed can increase Amlodipine  to 10mg .  Continue to check blood pressures at home.  Labs ordered today.  Return to clinic in 6 months for reevaluation.  Call sooner if concerns arise.       Relevant Medications   amLODipine  (NORVASC ) 5 MG tablet   ezetimibe  (ZETIA ) 10 MG tablet   lisinopril  (ZESTRIL ) 20 MG tablet   lisinopril -hydrochlorothiazide  (ZESTORETIC ) 20-25 MG tablet   Semaglutide , 1 MG/DOSE, 4 MG/3ML SOPN   EPINEPHrine  0.3 mg/0.3 mL IJ SOAJ injection     Respiratory   Centrilobular emphysema (HCC)   Chronic. Not well controlled.  Did not like the way Breztri  made her feel.  Has taken Anoro before and okay with restarting medication.  Follow up in 6 months.  Refilled Albuterol .        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 Well controlled.  Last 6.6%.  Continue with Metformin  and Ozempic  increased to 1mg  to aid in weight loss.  Patient is doing very well with Ozempic .  Will send Freestyle Libre to see if it is covered by insurance.   Labs ordered at visit today.  Follow up in 6 months.  Call sooner if concerns arise.       Relevant Medications   lisinopril  (ZESTRIL ) 20 MG tablet   lisinopril -hydrochlorothiazide  (ZESTORETIC ) 20-25 MG tablet   Semaglutide , 1  MG/DOSE, 4 MG/3ML SOPN   Other Relevant Orders   Comprehensive metabolic panel with GFR   Hemoglobin A1c   Microalbumin, Urine Waived   Hyperlipidemia associated with type 2 diabetes  mellitus (HCC)   Chronic.  Controlled.  Continue with current medication regimen on Zetia  10mg .  Does not tolerate statins.  Return to clinic in 6 months for reevaluation.  Call sooner if concerns arise.       Relevant Medications   amLODipine  (NORVASC ) 5 MG tablet   ezetimibe  (ZETIA ) 10 MG tablet   lisinopril  (ZESTRIL ) 20 MG tablet   lisinopril -hydrochlorothiazide  (ZESTORETIC ) 20-25 MG tablet   Semaglutide , 1 MG/DOSE, 4 MG/3ML SOPN   EPINEPHrine  0.3 mg/0.3 mL IJ SOAJ injection   Other Relevant Orders   Lipid panel     Other   Morbid obesity (HCC)   Recommended eating smaller high protein, low fat meals more frequently and exercising 30 mins a day 5 times a week with a goal of 10-15lb weight loss in the next 3 months. Ozempic  increased to 1mg  weekly.      Relevant Medications   Semaglutide , 1 MG/DOSE, 4 MG/3ML SOPN   Depression, recurrent   Chronic.  Controlled without medication at this time.  Return to clinic in 6 months for reevaluation.  Call sooner if concerns arise.       Other Visit Diagnoses       Anaphylaxis, sequela       Relevant Medications   EPINEPHrine  0.3 mg/0.3 mL IJ SOAJ injection     Acute cough       Will treat with azithromycin .  May need additional antibiotics.  Has taken Doxycyline before and tolerated it well. Will send if symptoms are not resolved.         Follow up plan: Return in about 6 months (around 04/28/2025) for Physical and Fasting labs.

## 2024-10-28 NOTE — Assessment & Plan Note (Signed)
 Recommended eating smaller high protein, low fat meals more frequently and exercising 30 mins a day 5 times a week with a goal of 10-15lb weight loss in the next 3 months. Ozempic  increased to 1mg  weekly.

## 2024-10-28 NOTE — Assessment & Plan Note (Addendum)
Chronic.  Controlled.  Continue with current medication regimen on Zetia 10mg.  Does not tolerate statins.  Return to clinic in 6 months for reevaluation.  Call sooner if concerns arise.   

## 2024-10-28 NOTE — Assessment & Plan Note (Signed)
 Chronic. Not well controlled.  Did not like the way Breztri  made her feel.  Has taken Anoro before and okay with restarting medication.  Follow up in 6 months.  Refilled Albuterol .

## 2024-10-29 ENCOUNTER — Ambulatory Visit: Admitting: Nurse Practitioner

## 2024-10-29 LAB — COMPREHENSIVE METABOLIC PANEL WITH GFR
ALT: 35 IU/L — ABNORMAL HIGH (ref 0–32)
AST: 29 IU/L (ref 0–40)
Albumin: 4.6 g/dL (ref 3.9–4.9)
Alkaline Phosphatase: 62 IU/L (ref 49–135)
BUN/Creatinine Ratio: 16 (ref 12–28)
BUN: 15 mg/dL (ref 8–27)
Bilirubin Total: 0.7 mg/dL (ref 0.0–1.2)
CO2: 24 mmol/L (ref 20–29)
Calcium: 9.8 mg/dL (ref 8.7–10.3)
Chloride: 100 mmol/L (ref 96–106)
Creatinine, Ser: 0.91 mg/dL (ref 0.57–1.00)
Globulin, Total: 2.2 g/dL (ref 1.5–4.5)
Glucose: 177 mg/dL — ABNORMAL HIGH (ref 70–99)
Potassium: 3.9 mmol/L (ref 3.5–5.2)
Sodium: 139 mmol/L (ref 134–144)
Total Protein: 6.8 g/dL (ref 6.0–8.5)
eGFR: 69 mL/min/1.73 (ref >59)

## 2024-10-29 LAB — LIPID PANEL
Chol/HDL Ratio: 3.6 ratio (ref 0.0–4.4)
Cholesterol, Total: 148 mg/dL (ref 100–199)
HDL: 41 mg/dL (ref >39)
LDL Chol Calc (NIH): 69 mg/dL (ref 0–99)
Triglycerides: 231 mg/dL — ABNORMAL HIGH (ref 0–149)
VLDL Cholesterol Cal: 38 mg/dL (ref 5–40)

## 2024-10-29 LAB — HEMOGLOBIN A1C
Est. average glucose Bld gHb Est-mCnc: 148 mg/dL
Hgb A1c MFr Bld: 6.8 % — ABNORMAL HIGH (ref 4.8–5.6)

## 2024-10-30 ENCOUNTER — Telehealth: Payer: Self-pay | Admitting: Nurse Practitioner

## 2024-10-30 NOTE — Telephone Encounter (Signed)
 Copied from CRM #[id number]. Topic: Clinical - Medical Advice >> Oct 30, 2024 12:55 PM Thersia BROCKS wrote: Reason for CRM: Patient called in regarding  needing assistance with her freestyle libre app, would like for a nurse to give her a callback regarding this

## 2024-11-02 ENCOUNTER — Ambulatory Visit: Payer: Self-pay | Admitting: Nurse Practitioner

## 2024-11-02 MED ORDER — DOXYCYCLINE HYCLATE 100 MG PO TABS: 100.0000 mg | ORAL_TABLET | Freq: Two times a day (BID) | ORAL | 0 refills | Status: AC

## 2024-11-02 NOTE — Telephone Encounter (Signed)
 Doxycycline sent to the pharmacy.

## 2024-11-02 NOTE — Telephone Encounter (Signed)
Patient notified of medication being sent in.  

## 2024-11-02 NOTE — Telephone Encounter (Signed)
 Called and spoke to patient. She states that she was able to figure out the Jet over the weekend.   While on the phone, patient states she was advised to let Darice know if she was not feeling better from recent visit. Patient states she is not feeling better and feels has if the congestion has moved to her chest. Please advise.

## 2024-12-02 ENCOUNTER — Telehealth: Payer: Self-pay | Admitting: Nurse Practitioner

## 2024-12-02 NOTE — Telephone Encounter (Unsigned)
 Copied from CRM [id number]. Topic: General - Billing Inquiry >> Dec 02, 2024  4:31 PM Charolett L wrote: Reason for CRM: Patient called in in reference to her billing issue and requested to speak with an print production planner. Spoke with CAL and was adv that the office manager isn't in today and will follow up tomorrow

## 2025-04-29 ENCOUNTER — Encounter: Admitting: Nurse Practitioner

## 2025-06-17 ENCOUNTER — Ambulatory Visit
